# Patient Record
Sex: Female | Born: 1950
Health system: Southern US, Community
[De-identification: ages and names within clinical notes are randomized; demographics above are authoritative.]

## PROBLEM LIST (undated history)

## (undated) DIAGNOSIS — D689 Coagulation defect, unspecified: Secondary | ICD-10-CM

## (undated) DIAGNOSIS — C801 Malignant (primary) neoplasm, unspecified: Secondary | ICD-10-CM

## (undated) DIAGNOSIS — K921 Melena: Secondary | ICD-10-CM

## (undated) DIAGNOSIS — M1990 Unspecified osteoarthritis, unspecified site: Secondary | ICD-10-CM

## (undated) DIAGNOSIS — D696 Thrombocytopenia, unspecified: Secondary | ICD-10-CM

## (undated) HISTORY — DX: Thrombocytopenia, unspecified: D69.6

## (undated) HISTORY — DX: Melena: K92.1

## (undated) HISTORY — PX: COLON SURGERY: SHX602

## (undated) HISTORY — PX: NO PAST SURGERIES: SHX2092

## (undated) HISTORY — DX: Coagulation defect, unspecified: D68.9

## (undated) HISTORY — DX: Unspecified osteoarthritis, unspecified site: M19.90

## (undated) HISTORY — PX: POLYPECTOMY: SHX149

## (undated) HISTORY — PX: COLONOSCOPY: SHX174

---

## 2015-07-29 HISTORY — PX: COLON SURGERY: SHX602

## 2016-03-06 DIAGNOSIS — D485 Neoplasm of uncertain behavior of skin: Secondary | ICD-10-CM | POA: Diagnosis not present

## 2016-03-06 DIAGNOSIS — Z85828 Personal history of other malignant neoplasm of skin: Secondary | ICD-10-CM | POA: Diagnosis not present

## 2016-03-07 DIAGNOSIS — C4441 Basal cell carcinoma of skin of scalp and neck: Secondary | ICD-10-CM | POA: Diagnosis not present

## 2016-03-07 DIAGNOSIS — L821 Other seborrheic keratosis: Secondary | ICD-10-CM | POA: Diagnosis not present

## 2016-03-07 DIAGNOSIS — L309 Dermatitis, unspecified: Secondary | ICD-10-CM | POA: Diagnosis not present

## 2016-04-22 DIAGNOSIS — C4441 Basal cell carcinoma of skin of scalp and neck: Secondary | ICD-10-CM | POA: Diagnosis not present

## 2016-05-09 DIAGNOSIS — L570 Actinic keratosis: Secondary | ICD-10-CM | POA: Diagnosis not present

## 2016-05-09 DIAGNOSIS — Z85828 Personal history of other malignant neoplasm of skin: Secondary | ICD-10-CM | POA: Diagnosis not present

## 2016-05-09 DIAGNOSIS — D225 Melanocytic nevi of trunk: Secondary | ICD-10-CM | POA: Diagnosis not present

## 2016-05-09 DIAGNOSIS — Z23 Encounter for immunization: Secondary | ICD-10-CM | POA: Diagnosis not present

## 2016-05-09 DIAGNOSIS — L821 Other seborrheic keratosis: Secondary | ICD-10-CM | POA: Diagnosis not present

## 2016-07-28 DIAGNOSIS — K921 Melena: Secondary | ICD-10-CM

## 2016-07-28 HISTORY — DX: Melena: K92.1

## 2016-10-10 ENCOUNTER — Other Ambulatory Visit (INDEPENDENT_AMBULATORY_CARE_PROVIDER_SITE_OTHER): Payer: Medicare Other

## 2016-10-10 ENCOUNTER — Ambulatory Visit (INDEPENDENT_AMBULATORY_CARE_PROVIDER_SITE_OTHER): Payer: Medicare Other | Admitting: Internal Medicine

## 2016-10-10 ENCOUNTER — Encounter (INDEPENDENT_AMBULATORY_CARE_PROVIDER_SITE_OTHER): Payer: Self-pay

## 2016-10-10 ENCOUNTER — Encounter: Payer: Self-pay | Admitting: Internal Medicine

## 2016-10-10 VITALS — BP 124/80 | HR 84 | Ht 59.5 in | Wt 124.0 lb

## 2016-10-10 DIAGNOSIS — Z8719 Personal history of other diseases of the digestive system: Secondary | ICD-10-CM

## 2016-10-10 DIAGNOSIS — R194 Change in bowel habit: Secondary | ICD-10-CM

## 2016-10-10 DIAGNOSIS — K625 Hemorrhage of anus and rectum: Secondary | ICD-10-CM

## 2016-10-10 DIAGNOSIS — Z862 Personal history of diseases of the blood and blood-forming organs and certain disorders involving the immune mechanism: Secondary | ICD-10-CM | POA: Diagnosis not present

## 2016-10-10 LAB — CBC WITH DIFFERENTIAL/PLATELET
BASOS ABS: 0.1 10*3/uL (ref 0.0–0.1)
Basophils Relative: 1.2 % (ref 0.0–3.0)
EOS ABS: 0.1 10*3/uL (ref 0.0–0.7)
Eosinophils Relative: 0.9 % (ref 0.0–5.0)
HCT: 44.9 % (ref 36.0–46.0)
HEMOGLOBIN: 14.9 g/dL (ref 12.0–15.0)
Lymphocytes Relative: 20.8 % (ref 12.0–46.0)
Lymphs Abs: 1.4 10*3/uL (ref 0.7–4.0)
MCHC: 33.2 g/dL (ref 30.0–36.0)
MCV: 89.9 fl (ref 78.0–100.0)
MONO ABS: 0.4 10*3/uL (ref 0.1–1.0)
Monocytes Relative: 6.2 % (ref 3.0–12.0)
Neutro Abs: 4.7 10*3/uL (ref 1.4–7.7)
Neutrophils Relative %: 70.9 % (ref 43.0–77.0)
Platelets: 68 10*3/uL — ABNORMAL LOW (ref 150.0–400.0)
RBC: 4.99 Mil/uL (ref 3.87–5.11)
RDW: 12.5 % (ref 11.5–15.5)
WBC: 6.7 10*3/uL (ref 4.0–10.5)

## 2016-10-10 LAB — COMPREHENSIVE METABOLIC PANEL
ALBUMIN: 4.1 g/dL (ref 3.5–5.2)
ALK PHOS: 96 U/L (ref 39–117)
ALT: 11 U/L (ref 0–35)
AST: 19 U/L (ref 0–37)
BUN: 11 mg/dL (ref 6–23)
CHLORIDE: 103 meq/L (ref 96–112)
CO2: 26 mEq/L (ref 19–32)
CREATININE: 0.66 mg/dL (ref 0.40–1.20)
Calcium: 9.4 mg/dL (ref 8.4–10.5)
GFR: 95.11 mL/min (ref >60.00)
GLUCOSE: 93 mg/dL (ref 70–99)
Potassium: 3.6 mEq/L (ref 3.5–5.1)
SODIUM: 138 meq/L (ref 135–145)
TOTAL PROTEIN: 7.2 g/dL (ref 6.0–8.3)
Total Bilirubin: 0.6 mg/dL (ref 0.2–1.2)

## 2016-10-10 MED ORDER — NA SULFATE-K SULFATE-MG SULF 17.5-3.13-1.6 GM/177ML PO SOLN: 1.0000 | Freq: Once | ORAL | 0 refills | Status: AC

## 2016-10-10 NOTE — Patient Instructions (Signed)
Your physician has requested that you go to the basement for the following lab work before leaving today:  CBC, CMP  You have been scheduled for a colonoscopy. Please follow written instructions given to you at your visit today.  Please pick up your prep supplies at the pharmacy within the next 1-3 days. If you use inhalers (even only as needed), please bring them with you on the day of your procedure. Your physician has requested that you go to www.startemmi.com and enter the access code given to you at your visit today. This web site gives a general overview about your procedure. However, you should still follow specific instructions given to you by our office regarding your preparation for the procedure.

## 2016-10-10 NOTE — Progress Notes (Signed)
Patient ID: Allison Dunlap, female   DOB: 07-09-1951, 66 y.o.   MRN: [id number] HPI: Allison Dunlap is a 66 year old female with a remote history of ulcerative colitis (she reports being diagnosed by Dr. Velora Heckler 40 years ago which was treated, she had 1 recurrent 7 years later again treated and no symptoms since), history of idiopathic thrombocytopenia who is seen to discuss change in bowel habits and rectal bleeding. She is here alone today.  She reports 6 months ago she had a significant change in her bowel movements. She noted smaller stools as well as intermittent diarrhea. 3 months ago she developed blood in her stools as well as abdominal bloating and gas. She reports she sometimes passes only blood which is painless in nature. Other times blood is mixed with stool. She has not had abdominal pain despite the change in bowel habits or bleeding. Some foods seem to make symptoms worse such as cheese. She denies weight loss associated with this. Denies upper GI complaint including no heartburn, dysphagia or odynophagia. No nausea or vomiting or change in appetite.  She does not have primary care. She moved back to [place] about 4 years ago from [place], [place] when her husband died.  Regarding her ulcerative colitis she reports she was diagnosed 40 years ago treated and then had what sounds like a flexible sigmoidoscopy showing improvement. Approximately 7 years later she had another flare again treated but she doesn't remember how. Denies family history of IBD or GI tract malignancy.  She does report while living in [place] she was diagnosed with low platelets on a wellness visit. She saw a hematologist and underwent bone marrow biopsy. Reportedly this was "okay" and she recalls the word "idiopathic". She does not feel this was ITP. She recalls the word "myelo" but not necessarily MDS. Follow-up was recommended but never occurred after moving here.  Past Medical History:  Diagnosis Date   . Temporary low platelet count ([place])   . Ulcerative colitis ([place]) 1978    Past Surgical History:  Procedure Laterality Date  . NO PAST SURGERIES      No outpatient prescriptions prior to visit.   No facility-administered medications prior to visit.     No Known Allergies  Family History  Problem Relation Age of Onset  . Alzheimer's disease Mother   . Liver disease Father 76    failure  . Breast cancer Sister   . Heart disease Maternal Grandfather   . Cancer Paternal Grandmother     type unknown    Social History  Substance Use Topics  . Smoking status: Former Smoker    Quit date: 07/28/1997  . Smokeless tobacco: Never Used  . Alcohol use 4.2 oz/week    7 Glasses of wine per week     Comment: wine and beer    ROS: As per history of present illness, otherwise negative  BP 124/80 (BP Location: Left Arm, Patient Position: Sitting, Cuff Size: Normal)   Pulse 84   Ht 4' 11.5" (1.511 m) Comment: height measured without shoes  Wt 124 lb (56.2 kg)   BMI 24.63 kg/m  Constitutional: Well-developed and well-nourished. No distress. HEENT: Normocephalic and atraumatic. Oropharynx is clear and moist. No oropharyngeal exudate. Conjunctivae are normal.  No scleral icterus. Neck: Neck supple. Trachea midline. Cardiovascular: Normal rate, regular rhythm and intact distal pulses. No M/R/G Pulmonary/chest: Effort normal and breath sounds normal. No wheezing, rales or rhonchi. Abdominal: Soft, nontender, nondistended. Bowel sounds active throughout. There are no masses palpable.  No hepatosplenomegaly. Extremities: no clubbing, cyanosis, or edema Lymphadenopathy: No cervical adenopathy noted.  Neurological: Alert and oriented to person place and time. Skin: Skin is warm and dry. No rashes noted. Psychiatric: Normal mood and affect. Behavior is normal.  RELEVANT LABS AND IMAGING: None available, labs ordered  ASSESSMENT/PLAN: 66 year old female with a remote history of  ulcerative colitis (she reports being diagnosed by Dr. Velora Heckler 40 years ago which was treated, she had 1 recurrent 7 years later again treated and no symptoms since), history of idiopathic thrombocytopenia who is seen to discuss change in bowel habits and rectal bleeding.   1. Change in bowel habits/blood in stools/possible hx of UC -- all of her symptoms or colonoscopy. Rule out IBD, rule out malignancy. We discussed the risk, benefits and alternatives to colonoscopy and she wishes to proceed. Check CBC and CMP today.  2. History of thrombocytopenia -- CBC pending today. I offered hematology referral and she wishes to defer for now. If platelets are extremely low I will be more insistent to this referral.  3. Primary care -- lack thereof right now. Again she declines primary care referral. I encouraged her to establish primary care.

## 2016-10-13 ENCOUNTER — Telehealth: Payer: Self-pay | Admitting: Internal Medicine

## 2016-10-13 NOTE — Telephone Encounter (Signed)
See result note.  

## 2016-10-14 ENCOUNTER — Other Ambulatory Visit: Payer: Self-pay

## 2016-10-14 ENCOUNTER — Encounter: Payer: Self-pay | Admitting: Internal Medicine

## 2016-10-14 ENCOUNTER — Telehealth: Payer: Self-pay

## 2016-10-14 ENCOUNTER — Ambulatory Visit (AMBULATORY_SURGERY_CENTER): Payer: Medicare Other | Admitting: Internal Medicine

## 2016-10-14 VITALS — BP 115/67 | HR 57 | Temp 98.0°F | Resp 15 | Ht 59.0 in | Wt 124.0 lb

## 2016-10-14 DIAGNOSIS — C19 Malignant neoplasm of rectosigmoid junction: Secondary | ICD-10-CM

## 2016-10-14 DIAGNOSIS — C189 Malignant neoplasm of colon, unspecified: Secondary | ICD-10-CM

## 2016-10-14 DIAGNOSIS — C801 Malignant (primary) neoplasm, unspecified: Secondary | ICD-10-CM

## 2016-10-14 DIAGNOSIS — R194 Change in bowel habit: Secondary | ICD-10-CM | POA: Diagnosis not present

## 2016-10-14 DIAGNOSIS — K6389 Other specified diseases of intestine: Secondary | ICD-10-CM

## 2016-10-14 HISTORY — DX: Malignant (primary) neoplasm, unspecified: C80.1

## 2016-10-14 MED ORDER — SODIUM CHLORIDE 0.9 % IV SOLN: 500.0000 mL | INTRAVENOUS | Status: DC

## 2016-10-14 NOTE — Progress Notes (Signed)
Called to room to assist during endoscopic procedure.  Patient ID and intended procedure confirmed with present staff. Received instructions for my participation in the procedure from the performing physician.  

## 2016-10-14 NOTE — Patient Instructions (Signed)
YOU HAD AN ENDOSCOPIC PROCEDURE TODAY AT [facility]:   Refer to the procedure report that was given to you for any specific questions about what was found during the examination.  If the procedure report does not answer your questions, please call your gastroenterologist to clarify.  If you requested that your care partner not be given the details of your procedure findings, then the procedure report has been included in a sealed envelope for you to review at your convenience later.  YOU SHOULD EXPECT: Some feelings of bloating in the abdomen. Passage of more gas than usual.  Walking can help get rid of the air that was put into your GI tract during the procedure and reduce the bloating. If you had a lower endoscopy (such as a colonoscopy or flexible sigmoidoscopy) you may notice spotting of blood in your stool or on the toilet paper. If you underwent a bowel prep for your procedure, you may not have a normal bowel movement for a few days.  Please Note:  You might notice some irritation and congestion in your nose or some drainage.  This is from the oxygen used during your procedure.  There is no need for concern and it should clear up in a day or so.  SYMPTOMS TO REPORT IMMEDIATELY:   Following lower endoscopy (colonoscopy or flexible sigmoidoscopy):  Excessive amounts of blood in the stool  Significant tenderness or worsening of abdominal pains  Swelling of the abdomen that is new, acute  Fever of 100F or higher  For urgent or emergent issues, a gastroenterologist can be reached at any hour by calling [phone].   DIET:  We do recommend a small meal at first, but then you may proceed to your regular diet.  Drink plenty of fluids but you should avoid alcoholic beverages for 24 hours.  MEDICATIONS:  Continue present medications. Use over the counter clotrimazole cream twice daily until healed for fungal infection of perianal skin and skin in the intergluteal  cleft.  ACTIVITY:  You should plan to take it easy for the rest of today and you should NOT DRIVE or use heavy machinery until tomorrow (because of the sedation medicines used during the test).    FOLLOW UP: Our staff will call the number listed on your records the next business day following your procedure to check on you and address any questions or concerns that you may have regarding the information given to you following your procedure. If we do not reach you, we will leave a message.  However, if you are feeling well and you are not experiencing any problems, there is no need to return our call.  We will assume that you have returned to your regular daily activities without incident.  If any biopsies were taken you will be contacted by phone or by letter within the next 1-3 weeks.  Please call us at [phone] if you have not heard about the biopsies in 3 weeks.   Perform a CT scan (computed tomography) of chest with contrast, abdomen with contrast, and pelvis with contrast. Appointment scheduled by Rosanne Sack for tomorrow 10/15/16. Patient given 2 bottles of contrast with instructions for use prior to CT scan.  Oncologic and surgical referrals will be done by Dr. Vena Rua office for further care pending CT results. Thank you for allowing [place] to provide for your healthcare needs today.  SIGNATURES/CONFIDENTIALITY: You and/or your care partner have signed paperwork which will be entered into your electronic medical  record.  These signatures attest to the fact that that the information above on your After Visit Summary has been reviewed and is understood.  Full responsibility of the confidentiality of this discharge information lies with you and/or your care-partner.

## 2016-10-14 NOTE — Op Note (Signed)
[facility] Patient Name: Allison Dunlap Procedure Date: 10/14/2016 1:30 PM MRN: [id number] Endoscopist: Jerene Bears , MD Age: 66 Referring MD:  Date of Birth: 1951/02/08 Gender: Female Account #: [id number] Procedure:                Colonoscopy Indications:              Rectal bleeding, Change in bowel habits, reported                            remote history of ulcerative colitis Medicines:                Monitored Anesthesia Care Procedure:                Pre-Anesthesia Assessment:                           - Prior to the procedure, a History and Physical                            was performed, and patient medications and                            allergies were reviewed. The patient's tolerance of                            previous anesthesia was also reviewed. The risks                            and benefits of the procedure and the sedation                            options and risks were discussed with the patient.                            All questions were answered, and informed consent                            was obtained. Prior Anticoagulants: The patient has                            taken no previous anticoagulant or antiplatelet                            agents. ASA Grade Assessment: II - A patient with                            mild systemic disease. After reviewing the risks                            and benefits, the patient was deemed in                            satisfactory condition to undergo the procedure.  After obtaining informed consent, the colonoscope                            was passed under direct vision. Throughout the                            procedure, the patient's blood pressure, pulse, and                            oxygen saturations were monitored continuously. The                            Model PCF-H190DL [id number]) scope was introduced                            through the anus and  advanced to the the terminal                            ileum. The colonoscopy was performed without                            difficulty. The patient tolerated the procedure                            well. The quality of the bowel preparation was                            good. The terminal ileum, ileocecal valve,                            appendiceal orifice, and rectum were photographed. Scope In: 1:45:51 PM Scope Out: 2:04:34 PM Scope Withdrawal Time: 0 hours 14 minutes 19 seconds  Total Procedure Duration: 0 hours 18 minutes 43 seconds  Findings:                 The perianal exam findings include a perianal                            fungal rash.                           The digital rectal exam was normal.                           The terminal ileum appeared normal.                           A fungating partially obstructing large mass was                            found in the recto-sigmoid colon. The mass was                            circumferential. The mass begins about 10 cm from  the dentate line and measured five cm in length.                            Oozing was present. Multiple biopsies were obtained                            with cold forceps for histology in a targeted                            manner. Area proximal to the tumor was tattooed on                            opposite walls with an injection of 3 mL total of                            Spot (carbon black).                           Multiple small and large-mouthed diverticula were                            found in the sigmoid colon and descending colon.                           The retroflexed view of the distal rectum and anal                            verge was normal and showed no anal or rectal                            abnormalities. Complications:            No immediate complications. Estimated Blood Loss:     Estimated blood loss was minimal. Impression:                - Perianal fungal rash found on perianal exam.                           - The examined portion of the ileum was normal.                           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 Biopsied. Tattooed.                           - Moderate diverticulosis in the sigmoid colon and                            in the descending colon.                           - The distal rectum and anal verge are normal on  retroflexion view. Recommendation:           - Patient has a contact number available for                            emergencies. The signs and symptoms of potential                            delayed complications were discussed with the                            patient. Return to normal activities tomorrow.                            Written discharge instructions were provided to the                            patient.                           - Resume previous diet.                           - Continue present medications.                           - Use OTC clotrimazole cream twice daily until                            healed for fungal infection of perianal skin and                            skin in the intergluteal cleft                           - Await pathology results.                           - Repeat colonoscopy is recommended for                            surveillance based on today's findings. The                            colonoscopy date will be determined after pathology                            results from today's exam become available for                            review.                           - Perform a CT scan (computed tomography) of chest                            with contrast, abdomen  with contrast and pelvis                            with contrast at appointment to be scheduled.                           - Oncologic and surgical referrals. Jerene Bears, MD 10/14/2016 2:15:46 PM This report has been signed electronically.

## 2016-10-14 NOTE — Telephone Encounter (Signed)
Pt scheduled for CT of CAP at [facility] 10/15/16@2 :30pm, pt to arrive there at 2:15pm. Pt to be NPO after 10:30am, drink bottle 1 of contrast at 12:30pm, bottle 2 of contrast at 1:30pm. [facility] RN to notify pt of appt and instructions.

## 2016-10-14 NOTE — Progress Notes (Signed)
Spontaneous respirations throughout. VSS. Resting comfortably. To PACU on room air. Report to  [employer].

## 2016-10-14 NOTE — Progress Notes (Signed)
Pt's states no medical or surgical changes since previsit or office visit. 

## 2016-10-15 ENCOUNTER — Telehealth: Payer: Self-pay | Admitting: *Deleted

## 2016-10-15 ENCOUNTER — Ambulatory Visit (INDEPENDENT_AMBULATORY_CARE_PROVIDER_SITE_OTHER)
Admission: RE | Admit: 2016-10-15 | Discharge: 2016-10-15 | Disposition: A | Discharge status: Home / Self Care | Payer: Medicare Other | Source: Ambulatory Visit | Attending: Internal Medicine | Admitting: Internal Medicine

## 2016-10-15 ENCOUNTER — Other Ambulatory Visit: Payer: Self-pay

## 2016-10-15 ENCOUNTER — Telehealth: Payer: Self-pay | Admitting: Internal Medicine

## 2016-10-15 ENCOUNTER — Telehealth: Payer: Self-pay

## 2016-10-15 DIAGNOSIS — C189 Malignant neoplasm of colon, unspecified: Secondary | ICD-10-CM | POA: Diagnosis not present

## 2016-10-15 DIAGNOSIS — K6389 Other specified diseases of intestine: Secondary | ICD-10-CM

## 2016-10-15 DIAGNOSIS — K5730 Diverticulosis of large intestine without perforation or abscess without bleeding: Secondary | ICD-10-CM | POA: Diagnosis not present

## 2016-10-15 MED ORDER — IOPAMIDOL (ISOVUE-300) INJECTION 61%
100.0000 mL | Freq: Once | INTRAVENOUS | Status: AC | PRN
  Administered 2016-10-15: 100 mL via INTRAVENOUS

## 2016-10-15 NOTE — Telephone Encounter (Signed)
Left a message at [phone] for the pt that we call to check on him.  This is the second call.  Left a message for him to call us back if any questions or concerns. maw

## 2016-10-15 NOTE — Telephone Encounter (Signed)
See result note.  

## 2016-10-15 NOTE — Telephone Encounter (Signed)
Pt is calling back said she is doing fine

## 2016-10-15 NOTE — Telephone Encounter (Signed)
Patient calling for CT results. Please advise.

## 2016-10-15 NOTE — Telephone Encounter (Signed)
No answer, message left for the patient. 

## 2016-10-16 ENCOUNTER — Telehealth: Payer: Self-pay

## 2016-10-16 NOTE — Telephone Encounter (Signed)
Pt has appt with [facility] Dr. Marcello Moores 11/11/16@9am , pt to arrive there at 8:30am.

## 2016-10-17 ENCOUNTER — Telehealth: Payer: Self-pay | Admitting: Hematology

## 2016-10-17 ENCOUNTER — Telehealth: Payer: Self-pay | Admitting: Internal Medicine

## 2016-10-17 ENCOUNTER — Encounter: Payer: Self-pay | Admitting: Hematology

## 2016-10-17 NOTE — Telephone Encounter (Signed)
Pt is currently scheduled for 11/11/16. Dr. Hilarie Fredrickson have you heard anything about a sooner appt? Please advise.

## 2016-10-17 NOTE — Telephone Encounter (Signed)
Tc to schedule the pt an appt to see Dr. Burr Medico.No ans. The phone continued to ring and no one picked up.

## 2016-10-20 NOTE — Telephone Encounter (Signed)
I did receive staff message from Dr. Manon Hilding office No earlier appointment available, but she is on the wait list and may well get a call to come in sooner

## 2016-10-20 NOTE — Telephone Encounter (Signed)
Left message for pt letting her know there were no sooner appts available but that she may get a call if there is a cancellation.

## 2016-10-21 ENCOUNTER — Telehealth: Payer: Self-pay | Admitting: *Deleted

## 2016-10-21 ENCOUNTER — Encounter: Payer: Self-pay | Admitting: Hematology

## 2016-10-21 ENCOUNTER — Ambulatory Visit (HOSPITAL_BASED_OUTPATIENT_CLINIC_OR_DEPARTMENT_OTHER): Payer: Medicare Other | Admitting: Hematology

## 2016-10-21 ENCOUNTER — Telehealth: Payer: Self-pay | Admitting: Hematology

## 2016-10-21 DIAGNOSIS — C20 Malignant neoplasm of rectum: Secondary | ICD-10-CM | POA: Insufficient documentation

## 2016-10-21 DIAGNOSIS — K5190 Ulcerative colitis, unspecified, without complications: Secondary | ICD-10-CM | POA: Diagnosis not present

## 2016-10-21 DIAGNOSIS — Z87891 Personal history of nicotine dependence: Secondary | ICD-10-CM | POA: Diagnosis not present

## 2016-10-21 DIAGNOSIS — Z809 Family history of malignant neoplasm, unspecified: Secondary | ICD-10-CM

## 2016-10-21 DIAGNOSIS — Z803 Family history of malignant neoplasm of breast: Secondary | ICD-10-CM | POA: Diagnosis not present

## 2016-10-21 DIAGNOSIS — D693 Immune thrombocytopenic purpura: Secondary | ICD-10-CM

## 2016-10-21 DIAGNOSIS — R599 Enlarged lymph nodes, unspecified: Secondary | ICD-10-CM | POA: Diagnosis not present

## 2016-10-21 NOTE — Progress Notes (Signed)
[facility]  [phone] [phone]  Clinic New Consult Note   Patient Care Team: No Pcp Per Patient as PCP - General (General Practice) 10/21/2016  REFERRAL PHYSICIAN: Dr. Hilarie Fredrickson  CHIEF COMPLAINTS/PURPOSE OF CONSULTATION:  Newly diagnosed rectosigmoid cancer     Rectal cancer ([place])   10/14/2016 Initial Diagnosis    Rectal cancer ([place])      10/14/2016 Procedure    Colonoscopy by Dr. Hilarie Fredrickson showed a fungating partially upchucking large mass in the rectosigmoid colon, the mass begins about 10 cm from the dentate line and measured 5 cm in length. Biopsy was obtained. Multiple small and largemouth diverticula were found in the sigmoid colon and the ascending colon.      10/14/2016 Initial Biopsy    Rectosigmoid mass biopsy showed adenocarcinoma       10/15/2016 Imaging    CT of chest, abdomen and pelvis with contrast showed a 4 cm annular colonic soft tissue mass near the rectosigmoid junction, consistent with known primary carcinoma. Several small her chronic lymph nodes measuring up to 57m, suspicious for local lymph node metastasis. No evidence of distant metastasis.       HISTORY OF PRESENTING ILLNESS:  Allison Droege66y.o. female is here because of Her recently diagnosed rectosigmoid colon cancer. She was referred by her gastroenterologist Dr. PHilarie Fredrickson She presents to my clinic by herself today.  She has been having constipationa and diarrhea for 6 months, and daily hematochezia, a tea spoon each time, no rectal pain, no bloating, nause or other symptoms, no change of appetie and weight. She does not have a primary care physician, and [facility] She was seen by Dr. PHilarie Fredrickson She underwent colonoscopy, which showed a large mass in the rectosigmoid junction. Biopsy showed adenocarcinoma.  She was diagnosed with ulcerative colitis in her 20's, resolved and now.  She has had thrombocytopenia since 2013, she has  bone marrow biopsy, which was normal per pt, never required any treatment    She is widowed, no children, [place] in [place] she is not working   MEDICAL HISTORY:  Past Medical History:  Diagnosis Date  . Clotting disorder ([place]   . Temporary low platelet count ([place]     SURGICAL HISTORY: Past Surgical History:  Procedure Laterality Date  . NO PAST SURGERIES      SOCIAL HISTORY: Social History   Social History  . Marital status: Widowed    Spouse name: N/A  . Number of children: 0  . Years of education: N/A   Occupational History  . retired    Social History Main Topics  . Smoking status: Former Smoker    Packs/day: 0.50    Years: 30.00    Quit date: 07/28/1997  . Smokeless tobacco: Never Used  . Alcohol use 4.2 oz/week    7 Glasses of wine per week     Comment: wine and beer  . Drug use: No  . Sexual activity: Not on file   Other Topics Concern  . Not on file   Social History Narrative  . No narrative on file    FAMILY HISTORY: Family History  Problem Relation Age of Onset  . Alzheimer's disease Mother   . Liver disease Father 615   failure  . Breast cancer Sister   . Cancer Sister 520   breast cancer   . Heart disease Maternal Grandfather   . Cancer Paternal Grandmother     type unknown  ALLERGIES:  has No Known Allergies.  MEDICATIONS:  Current Outpatient Prescriptions  Medication Sig Dispense Refill  . Cholecalciferol (VITAMIN D PO) Take 1 tablet by mouth daily.    . Cyanocobalamin (VITAMIN B-12 PO) Take 1 tablet by mouth daily.    . Multiple Vitamins-Minerals (OCUVITE PO) Take 1 tablet by mouth daily.     Current Facility-Administered Medications  Medication Dose Route Frequency Provider Last Rate Last Dose  . 0.9 %  sodium chloride infusion  500 mL Intravenous Continuous Jerene Bears, MD        REVIEW OF SYSTEMS:   Constitutional: Denies fevers, chills or abnormal night sweats Eyes: Denies blurriness of vision, double  vision or watery eyes Ears, nose, mouth, throat, and face: Denies mucositis or sore throat Respiratory: Denies cough, dyspnea or wheezes Cardiovascular: Denies palpitation, chest discomfort or lower extremity swelling Gastrointestinal:  Denies nausea, heartburn, (+) constipation and diarrhea, (+) hematochezia Skin: Denies abnormal skin rashes Lymphatics: Denies new lymphadenopathy or easy bruising Neurological:Denies numbness, tingling or new weaknesses Behavioral/Psych: Mood is stable, no new changes  All other systems were reviewed with the patient and are negative.  PHYSICAL EXAMINATION: ECOG PERFORMANCE STATUS: 0 - Asymptomatic  Vitals:   10/21/16 1535  BP: 138/77  Pulse: 74  Resp: 18  Temp: 97.8 F (36.6 C)   Filed Weights   10/21/16 1535  Weight: 124 lb (56.2 kg)    GENERAL:alert, no distress and comfortable SKIN: skin color, texture, turgor are normal, no rashes or significant lesions EYES: normal, conjunctiva are pink and non-injected, sclera clear OROPHARYNX:no exudate, no erythema and lips, buccal mucosa, and tongue normal  NECK: supple, thyroid normal size, non-tender, without nodularity LYMPH:  no palpable lymphadenopathy in the cervical, axillary or inguinal LUNGS: clear to auscultation and percussion with normal breathing effort HEART: regular rate & rhythm and no murmurs and no lower extremity edema ABDOMEN:abdomen soft, non-tender and normal bowel sounds Musculoskeletal:no cyanosis of digits and no clubbing  PSYCH: alert & oriented x 3 with fluent speech NEURO: no focal motor/sensory deficits  LABORATORY DATA:  I have reviewed the data as listed CBC Latest Ref Rng & Units 10/10/2016  WBC 4.0 - 10.5 K/uL 6.7  Hemoglobin 12.0 - 15.0 g/dL 14.9  Hematocrit 36.0 - 46.0 % 44.9  Platelets 150.0 - 400.0 K/uL 68.0 Repeated and verified X2.(L)   CMP Latest Ref Rng & Units 10/10/2016  Glucose 70 - 99 mg/dL 93  BUN 6 - 23 mg/dL 11  Creatinine 0.40 - 1.20 mg/dL  0.66  Sodium 135 - 145 mEq/L 138  Potassium 3.5 - 5.1 mEq/L 3.6  Chloride 96 - 112 mEq/L 103  CO2 19 - 32 mEq/L 26  Calcium 8.4 - 10.5 mg/dL 9.4  Total Protein 6.0 - 8.3 g/dL 7.2  Total Bilirubin 0.2 - 1.2 mg/dL 0.6  Alkaline Phos 39 - 117 U/L 96  AST 0 - 37 U/L 19  ALT 0 - 35 U/L 11   PATHOLOGY REPORT  Diagnosis 10/14/2016 Colon, biopsy, rectosigmoid tumor - ADENOCARCINOMA.  RADIOGRAPHIC STUDIES: I have personally reviewed the radiological images as listed and agreed with the findings in the report. Ct Chest W Contrast  Result Date: 10/15/2016 CLINICAL DATA:  Newly diagnosed colon carcinoma by colonoscopy. Ulcerative colitis. EXAM: CT CHEST, ABDOMEN, AND PELVIS WITH CONTRAST TECHNIQUE: Multidetector CT imaging of the chest, abdomen and pelvis was performed following the standard protocol during bolus administration of intravenous contrast. CONTRAST:  187m ISOVUE-300 IOPAMIDOL (ISOVUE-300) INJECTION 61% COMPARISON:  None. FINDINGS:  CT CHEST FINDINGS Cardiovascular: No acute findings. Mediastinum/Lymph Nodes: No pathologically enlarged lymph nodes identified. 10 mm low-attenuation left thyroid lobe nodule incidentally noted. Lungs/Pleura: No pulmonary infiltrate or mass identified. No effusion present. Musculoskeletal:  No suspicious bone lesions identified. CT ABDOMEN AND PELVIS FINDINGS Hepatobiliary: Several small fluid attenuation cysts are seen in the right and left hepatic lobes. No liver masses identified. Gallbladder is unremarkable. Pancreas:  No mass or inflammatory changes. Spleen:  Within normal limits in size and appearance. Adrenals/Urinary tract:  No masses or hydronephrosis. Stomach/Bowel: Annular colonic soft tissue mass is seen near the rectosigmoid junction which measures 3.5 x 3.6 cm by 4.3 cm. There is no evidence of bowel obstruction. Diverticulosis is seen involving the proximal sigmoid colon, however there is no evidence of diverticulitis. Vascular/Lymphatic: Right  pericolonic lymph node is seen measuring 10 mm on image 95/2, suspicious for lymph node metastasis. Tiny 5-6 mm posterior pericolonic lymph nodes are seen in the presacral space, also suspicious for early lymph node metastases. No pathologically enlarged lymph nodes identified within the abdomen. No abdominal aortic aneurysm. Reproductive:  No mass or other significant abnormality identified. Other:  None. Musculoskeletal: No suspicious bone lesions identified. Degenerative lumbar spondylosis incidentally noted. IMPRESSION: 4 cm annular colonic soft tissue mass near the rectosigmoid junction, consistent with known primary carcinoma. Several small pericolonic lymph nodes measuring up to 10 mm, suspicious for local lymph node metastases. No evidence of distant metastatic disease. Proximal sigmoid diverticulosis.  No evidence of diverticulitis. Electronically Signed   By: Earle Gell M.D.   On: 10/15/2016 15:57   Ct Abdomen Pelvis W Contrast  Result Date: 10/15/2016 CLINICAL DATA:  Newly diagnosed colon carcinoma by colonoscopy. Ulcerative colitis. EXAM: CT CHEST, ABDOMEN, AND PELVIS WITH CONTRAST TECHNIQUE: Multidetector CT imaging of the chest, abdomen and pelvis was performed following the standard protocol during bolus administration of intravenous contrast. CONTRAST:  166m ISOVUE-300 IOPAMIDOL (ISOVUE-300) INJECTION 61% COMPARISON:  None. FINDINGS: CT CHEST FINDINGS Cardiovascular: No acute findings. Mediastinum/Lymph Nodes: No pathologically enlarged lymph nodes identified. 10 mm low-attenuation left thyroid lobe nodule incidentally noted. Lungs/Pleura: No pulmonary infiltrate or mass identified. No effusion present. Musculoskeletal:  No suspicious bone lesions identified. CT ABDOMEN AND PELVIS FINDINGS Hepatobiliary: Several small fluid attenuation cysts are seen in the right and left hepatic lobes. No liver masses identified. Gallbladder is unremarkable. Pancreas:  No mass or inflammatory changes. Spleen:   Within normal limits in size and appearance. Adrenals/Urinary tract:  No masses or hydronephrosis. Stomach/Bowel: Annular colonic soft tissue mass is seen near the rectosigmoid junction which measures 3.5 x 3.6 cm by 4.3 cm. There is no evidence of bowel obstruction. Diverticulosis is seen involving the proximal sigmoid colon, however there is no evidence of diverticulitis. Vascular/Lymphatic: Right pericolonic lymph node is seen measuring 10 mm on image 95/2, suspicious for lymph node metastasis. Tiny 5-6 mm posterior pericolonic lymph nodes are seen in the presacral space, also suspicious for early lymph node metastases. No pathologically enlarged lymph nodes identified within the abdomen. No abdominal aortic aneurysm. Reproductive:  No mass or other significant abnormality identified. Other:  None. Musculoskeletal: No suspicious bone lesions identified. Degenerative lumbar spondylosis incidentally noted. IMPRESSION: 4 cm annular colonic soft tissue mass near the rectosigmoid junction, consistent with known primary carcinoma. Several small pericolonic lymph nodes measuring up to 10 mm, suspicious for local lymph node metastases. No evidence of distant metastatic disease. Proximal sigmoid diverticulosis.  No evidence of diverticulitis. Electronically Signed   By: JSharrie RothmanD.  On: 10/15/2016 15:57   Colonoscopy 10/14/2016 - The perianal exam findings include a perianal fungal rash. - The digital rectal exam was normal. - The terminal ileum appeared normal. - A fungating partially obstructing large mass was found in the recto-sigmoid colon. The mass was circumferential. The mass begins about 10 cm from the dentate line and measured five cm in length. Oozing was present. Multiple biopsies were obtained with cold forceps for histology in a targeted manner. Area proximal to the tumor was tattooed on opposite walls with an injection of 3 mL total of Spot (carbon black). - Multiple small and large-mouthed  diverticula were found in the sigmoid colon and descending colon. - The retroflexed view of the distal rectum and anal verge was normal and showed no anal or rectal abnormalities.  ASSESSMENT & PLAN: 66 year old Caucasian female, with past medical history of ulcerative colitis, some cytopenia, presented with diarrhea, constipation, and persistent hematochezia. Months.  1. Rectosigmoid adenocarcinoma, cT3N1M0, stage IIIB, MSI-pending  -I reviewed his CT scan findings, colonoscopy and biopsy pathology results in great details with patient. -The tumor is located in the proximal rectum and rectosigmoid junction. -CT scan showed several perirectal lymph nodes, suspicious for node metastasis. She likely has locally advanced disease. -I presented her case in our GI tumor Board. Due to the location of the cancer, Dr. Marcello Moores recommends neoadjuvant chemotherapy and radiation, followed by surgical resection. -Tumor Board feels her disease is likely T3 N1 based on CT scan findings, EUS or MRI for staging is probably not needed. -She will be referred to see radiation oncologist Dr. Lisbeth Renshaw -I recommend neoadjuvant concurrent chemotherapy with Xeloda or 5-FU. Patient opted Xeloda. --Chemotherapy consent: Side effects including but does not not limited to, fatigue, nausea, vomiting, diarrhea, hair loss, neuropathy, fluid retention, renal and kidney dysfunction, neutropenic fever, needed for blood transfusion, bleeding, coronary artery spasm and heart attack, were discussed with patient in great detail. He agrees to proceed. -we plan to start chemo and radiation in about 2 weeks - chemo class  2. History of ITP  -She was found to have some cytopenia many years ago, per patient she underwent a bone marrow biopsy which was unremarkable. This is opposed consistent with ITP.  -She has not required any treatment. Her platelet counts is 68000 on 10/10/16 -We discussed chemotherapy may cause thrombocytopenia. If needed,  I would consider steroids treatment for her ITP during chemo.   3. History of ulcerative colitis -follow up with Dr. Hilarie Fredrickson   Plan -Case was discussed in GI tumor Board -She will be referred to radiation oncologist Dr. Lisbeth Renshaw -I will send a prescription of Xeloda to our oral pharmacy -plan to start neoadjuvant chemoRT the week of 4/16.      No orders of the defined types were placed in this encounter.   All questions were answered. The patient knows to call the clinic with any problems, questions or concerns. I spent 55 minutes counseling the patient face to face. The total time spent in the appointment was 60 minutes and more than 50% was on counseling.     Truitt Merle, MD 10/21/2016

## 2016-10-21 NOTE — Telephone Encounter (Signed)
Appt has been scheduled for the pt to see Dr. Burr Medico today, 3/27 at 330pm. Pt aware to arrive 15 minutes early. Voiced understanding.

## 2016-10-21 NOTE — Telephone Encounter (Signed)
Left message with Dr Jodelle Gross nurse at [facility] that Dr Burr Medico needs recent office note & bone marrow biopsy report.  Our office ph # & fax # left on message.  Release of information signed  & can fax if they need it.

## 2016-10-22 ENCOUNTER — Encounter: Payer: Self-pay | Admitting: Radiation Oncology

## 2016-10-23 MED ORDER — CAPECITABINE 500 MG PO TABS: ORAL_TABLET | ORAL | 0 refills | Status: DC

## 2016-10-24 ENCOUNTER — Encounter: Payer: Self-pay | Admitting: Hematology

## 2016-10-27 ENCOUNTER — Telehealth: Payer: Self-pay | Admitting: Pharmacist

## 2016-10-27 ENCOUNTER — Encounter: Payer: Self-pay | Admitting: Radiation Oncology

## 2016-10-27 DIAGNOSIS — C20 Malignant neoplasm of rectum: Secondary | ICD-10-CM

## 2016-10-27 MED ORDER — CAPECITABINE 500 MG PO TABS: ORAL_TABLET | ORAL | 0 refills | Status: DC

## 2016-10-27 MED FILL — XELODA 500 MG TABLET: 500 | 28 days supply | Qty: 100 | Fill #0

## 2016-10-27 NOTE — Telephone Encounter (Signed)
Oral Chemotherapy Pharmacist Encounter  Received new prescription for Xeloda for rectal cancer to be used in conjunction with XRT.  Labs from 10/10/16 reviewed, OK for treatment. Noted chronic thrombocytopenia. This will be monitored.  Current medication list in Epic assessed, no DDIs with Xeloda identified  Prescription will be e-scribed to the [facility] for benefits analysis. Oral Oncology Clinic will continue to follow.  Allison Dunlap, PharmD, BCPS, BCOP 10/27/2016  3:44 PM Oral Oncology Clinic [phone]

## 2016-10-27 NOTE — Progress Notes (Addendum)
GI Location of Tumor / Histology: Rectosigmoid colon cancer  Allison Dunlap presented  months ago with symptoms of: daily hemaatochezia,diarrhea and constipation 6 months  Biopsies of  (if applicable) revealed: Diagnosis 10/14/2016:  Colon, biopsy, rectosigmoid tumor- ADENOCARCINOMA.  Past/Anticipated interventions by surgeon, if any: 10/14/16 Colonoscopy with bx Dr. Hilarie Fredrickson, Md  Past/Anticipated interventions by medical oncology, if any: Dr. Elpidio Galea 10/21/16, Xeloda rx , plan to start neoadjuvant chemo RT the week 11/10/16  Weight changes, if any: NO  Bowel/Bladder complaints, if GBK:ORJGY stools 3-4x day, blood in tissue occasional gas abdomen, no bladder issues  Nausea / Vomiting, if any:  No  Pain issues, if any: no  Any blood per rectum:   yes SAFETY ISSUES:no  Prior radiation? NO  Pacemaker/ICD?  NO   Is the patient on methotrexate? NO  Current Complaints/Details:Widowed, no children, Hx Ulcerative colitis,, HX ITP< thrombocytopenia,  former smoker 1/2 ppd 30 years quit 07/28/97, 7 glasses wine per week, no drug use or smokeless tobacco Mother alzheimer's  diseae,Father Liver disease,failure,sister breast cancer,sister breast cancer, Maternal grandfather heart disease, Paternal grandmother cancer,unknown  BP 140/83 (BP Location: Left Arm, Patient Position: Sitting, Cuff Size: Normal)   Pulse 75   Temp 98.4 F (36.9 C) (Oral)   Resp 20   Ht 4' 11"  (1.499 m)   Wt 123 lb 12.8 oz (56.2 kg)   BMI 25.00 kg/m   Wt Readings from Last 3 Encounters:  11/03/16 123 lb 12.8 oz (56.2 kg)  10/21/16 124 lb (56.2 kg)  10/14/16 124 lb (56.2 kg)

## 2016-10-28 ENCOUNTER — Telehealth: Payer: Self-pay | Admitting: *Deleted

## 2016-10-28 NOTE — Telephone Encounter (Signed)
Spoke with Chrissy, RN @ [facility] to request office notes and bone marrow biopsy report from Dr. Aletta Edouard.  Per Chrissy, Dr. Aletta Edouard is on extended leave at present.  Pt had not seen any providers  at [facility] at all.   Per Chrissy, pt was not even a pt there; there were no notes, no testing studies done. Angie    Phone      [phone].

## 2016-11-03 ENCOUNTER — Encounter: Payer: Self-pay | Admitting: Radiation Oncology

## 2016-11-03 ENCOUNTER — Ambulatory Visit
Admission: RE | Admit: 2016-11-03 | Discharge: 2016-11-03 | Disposition: A | Discharge status: Home / Self Care | Payer: Medicare Other | Source: Ambulatory Visit | Attending: Radiation Oncology | Admitting: Radiation Oncology

## 2016-11-03 VITALS — BP 140/83 | HR 75 | Temp 98.4°F | Resp 20 | Ht 59.0 in | Wt 123.8 lb

## 2016-11-03 DIAGNOSIS — Z510 Encounter for antineoplastic radiation therapy: Secondary | ICD-10-CM | POA: Insufficient documentation

## 2016-11-03 DIAGNOSIS — Z853 Personal history of malignant neoplasm of breast: Secondary | ICD-10-CM | POA: Insufficient documentation

## 2016-11-03 DIAGNOSIS — C19 Malignant neoplasm of rectosigmoid junction: Secondary | ICD-10-CM | POA: Diagnosis not present

## 2016-11-03 DIAGNOSIS — C20 Malignant neoplasm of rectum: Secondary | ICD-10-CM

## 2016-11-03 DIAGNOSIS — Z8669 Personal history of other diseases of the nervous system and sense organs: Secondary | ICD-10-CM | POA: Diagnosis not present

## 2016-11-03 HISTORY — DX: Malignant (primary) neoplasm, unspecified: C80.1

## 2016-11-03 NOTE — Progress Notes (Signed)
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   Staging: [id number]   HISTORY OF PRESENT ILLNESS::Allison Dunlap is a 66 y.o. female who is seen for an initial consultation visit.  She presented to Dr. Hilarie Fredrickson at [facility] gastroenterology on 10/14/16 after a 6 month history of constipation, diarrhea, and daily hematochezia. Colonoscopy revealed a 5 cm mass in the rectosigmoid junction. 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 Patient underwent CT scan of the chest, abdomen, and pelvis on 10/15/16. This showed a 4 cm annular colonic soft tissue mass near the rectosigmoid junction as well as several small chronic lymph nodes measuring up to 10 mm suspicious for local metastasis. There was no evidence of distant metastasis.  Patient was seen by Dr. Burr Medico on 10/24/16. Per her report, the patient's case was presented at GI tumor Board. The consensus was to proceed with neoadjuvant chemotherapy (Xeloda) and radiation, followed by surgical resection. Patient is scheduled to start chemotherapy the week of 11/10/16.  Patient reports loose stools 3-4 times a day, blood on tissue, and occasional gas. She denies weight changes, nausea/vomiting, pain or bladder complaints.  PREVIOUS RADIATION THERAPY: No   PAST MEDICAL HISTORY:  has a past medical history of Cancer ([place]) (10/14/2016); Clotting disorder ([place]); and Temporary low platelet count ([place]).     PAST SURGICAL HISTORY: Past Surgical History:  Procedure Laterality Date  . NO PAST SURGERIES       FAMILY HISTORY: family history includes Alzheimer's disease in her mother; Breast cancer in her sister; Cancer in her paternal grandmother; Cancer (age of onset: 65) in her sister; Heart disease in her maternal  grandfather; Liver disease (age of onset: 57) in her father.   SOCIAL HISTORY:  reports that she quit smoking about 19 years ago. She has a 15.00 pack-year smoking history. She has never used smokeless tobacco. She reports that she drinks about 4.2 oz of alcohol per week . She reports that she does not use drugs.   ALLERGIES: Patient has no known allergies.   MEDICATIONS:  Current Outpatient Prescriptions  Medication Sig Dispense Refill  . Cholecalciferol (VITAMIN D PO) Take 1 tablet by mouth daily.    . Cyanocobalamin (VITAMIN B-12 PO) Take 1 tablet by mouth daily.    . Multiple Vitamins-Minerals (OCUVITE PO) Take 1 tablet by mouth daily.    . capecitabine (XELODA) 500 MG tablet Take 3 tab in the morning and 2 tab in the evening, on the days of radiation, Monday through Friday (Patient not taking: Reported on 11/03/2016) 150 tablet 0   Current Facility-Administered Medications  Medication Dose Route Frequency Provider Last Rate Last Dose  . 0.9 %  sodium chloride infusion  500 mL Intravenous Continuous Jerene Bears, MD         REVIEW OF SYSTEMS:  A complete review of systems was reviewed with the patient and pertinent positives findings are noted in the history of present illness.  PHYSICAL EXAM:  height is 4' 11"  (1.499 m) and weight is 123 lb 12.8 oz (56.2 kg). Her oral temperature is 98.4 F (36.9 C). Her blood pressure is 140/83 and her pulse is 75. Her respiration is 20.   ECOG = 0  0 - Asymptomatic (Fully active, able to carry  on all predisease activities without restriction)  1 - Symptomatic but completely ambulatory (Restricted in physically strenuous activity but ambulatory and able to carry out work of a light or sedentary nature. For example, light housework, office work)  2 - Symptomatic, <50% in bed during the day (Ambulatory and capable of all self care but unable to carry out any work activities. Up and about more than 50% of waking hours)  3 - Symptomatic, >50% in  bed, but not bedbound (Capable of only limited self-care, confined to bed or chair 50% or more of waking hours)  4 - Bedbound (Completely disabled. Cannot carry on any self-care. Totally confined to bed or chair)  5 - Death   Eustace Pen MM, Creech RH, Tormey DC, et al. [phone]). "Toxicity and response criteria of the [facility] Group". Bernice Oncol. 5 (6): 649-55    General: Well-developed, in no acute distress HEENT: Normocephalic, atraumatic; oral cavity clear Neck: Supple without any lymphadenopathy Cardiovascular: Regular rate and rhythm Respiratory: Clear to auscultation bilaterally GI: Soft, nontender, normal bowel sounds Extremities: No edema present Neuro: No focal deficits  On rectal exam there were no external lesions, and a barely palpable tumor within the mid to upper rectum. No blood on exam glove.   LABORATORY DATA:  Lab Results  Component Value Date   WBC 6.7 10/10/2016   HGB 14.9 10/10/2016   HCT 44.9 10/10/2016   MCV 89.9 10/10/2016   PLT 68.0 Repeated and verified X2. (L) 10/10/2016   Lab Results  Component Value Date   NA 138 10/10/2016   K 3.6 10/10/2016   CL 103 10/10/2016   CO2 26 10/10/2016   Lab Results  Component Value Date   ALT 11 10/10/2016   AST 19 10/10/2016   ALKPHOS 96 10/10/2016   BILITOT 0.6 10/10/2016      RADIOGRAPHY: Ct Chest W Contrast  Result Date: 10/15/2016 CLINICAL DATA:  Newly diagnosed colon carcinoma by colonoscopy. Ulcerative colitis. EXAM: CT CHEST, ABDOMEN, AND PELVIS WITH CONTRAST TECHNIQUE: Multidetector CT imaging of the chest, abdomen and pelvis was performed following the standard protocol during bolus administration of intravenous contrast. CONTRAST:  148m ISOVUE-300 IOPAMIDOL (ISOVUE-300) INJECTION 61% COMPARISON:  None. FINDINGS: CT CHEST FINDINGS Cardiovascular: No acute findings. Mediastinum/Lymph Nodes: No pathologically enlarged lymph nodes identified. 10 mm low-attenuation left thyroid lobe  nodule incidentally noted. Lungs/Pleura: No pulmonary infiltrate or mass identified. No effusion present. Musculoskeletal:  No suspicious bone lesions identified. CT ABDOMEN AND PELVIS FINDINGS Hepatobiliary: Several small fluid attenuation cysts are seen in the right and left hepatic lobes. No liver masses identified. Gallbladder is unremarkable. Pancreas:  No mass or inflammatory changes. Spleen:  Within normal limits in size and appearance. Adrenals/Urinary tract:  No masses or hydronephrosis. Stomach/Bowel: Annular colonic soft tissue mass is seen near the rectosigmoid junction which measures 3.5 x 3.6 cm by 4.3 cm. There is no evidence of bowel obstruction. Diverticulosis is seen involving the proximal sigmoid colon, however there is no evidence of diverticulitis. Vascular/Lymphatic: Right pericolonic lymph node is seen measuring 10 mm on image 95/2, suspicious for lymph node metastasis. Tiny 5-6 mm posterior pericolonic lymph nodes are seen in the presacral space, also suspicious for early lymph node metastases. No pathologically enlarged lymph nodes identified within the abdomen. No abdominal aortic aneurysm. Reproductive:  No mass or other significant abnormality identified. Other:  None. Musculoskeletal: No suspicious bone lesions identified. Degenerative lumbar spondylosis incidentally noted. IMPRESSION: 4 cm annular colonic soft tissue mass near the  rectosigmoid junction, consistent with known primary carcinoma. Several small pericolonic lymph nodes measuring up to 10 mm, suspicious for local lymph node metastases. No evidence of distant metastatic disease. Proximal sigmoid diverticulosis.  No evidence of diverticulitis. Electronically Signed   By: Earle Gell M.D.   On: 10/15/2016 15:57   Ct Abdomen Pelvis W Contrast  Result Date: 10/15/2016 CLINICAL DATA:  Newly diagnosed colon carcinoma by colonoscopy. Ulcerative colitis. EXAM: CT CHEST, ABDOMEN, AND PELVIS WITH CONTRAST TECHNIQUE: Multidetector CT  imaging of the chest, abdomen and pelvis was performed following the standard protocol during bolus administration of intravenous contrast. CONTRAST:  156m ISOVUE-300 IOPAMIDOL (ISOVUE-300) INJECTION 61% COMPARISON:  None. FINDINGS: CT CHEST FINDINGS Cardiovascular: No acute findings. Mediastinum/Lymph Nodes: No pathologically enlarged lymph nodes identified. 10 mm low-attenuation left thyroid lobe nodule incidentally noted. Lungs/Pleura: No pulmonary infiltrate or mass identified. No effusion present. Musculoskeletal:  No suspicious bone lesions identified. CT ABDOMEN AND PELVIS FINDINGS Hepatobiliary: Several small fluid attenuation cysts are seen in the right and left hepatic lobes. No liver masses identified. Gallbladder is unremarkable. Pancreas:  No mass or inflammatory changes. Spleen:  Within normal limits in size and appearance. Adrenals/Urinary tract:  No masses or hydronephrosis. Stomach/Bowel: Annular colonic soft tissue mass is seen near the rectosigmoid junction which measures 3.5 x 3.6 cm by 4.3 cm. There is no evidence of bowel obstruction. Diverticulosis is seen involving the proximal sigmoid colon, however there is no evidence of diverticulitis. Vascular/Lymphatic: Right pericolonic lymph node is seen measuring 10 mm on image 95/2, suspicious for lymph node metastasis. Tiny 5-6 mm posterior pericolonic lymph nodes are seen in the presacral space, also suspicious for early lymph node metastases. No pathologically enlarged lymph nodes identified within the abdomen. No abdominal aortic aneurysm. Reproductive:  No mass or other significant abnormality identified. Other:  None. Musculoskeletal: No suspicious bone lesions identified. Degenerative lumbar spondylosis incidentally noted. IMPRESSION: 4 cm annular colonic soft tissue mass near the rectosigmoid junction, consistent with known primary carcinoma. Several small pericolonic lymph nodes measuring up to 10 mm, suspicious for local lymph node  metastases. No evidence of distant metastatic disease. Proximal sigmoid diverticulosis.  No evidence of diverticulitis. Electronically Signed   By: JEarle GellM.D.   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 I discussed the course of treatment, side effects, and potential toxicities associated with treatment. The patient appears to understand and wishes to proceed with radiation. A consent form was signed and a copy was placed in the patient's chart.   PLAN: The patient will begin Xeloda on 11/10/16 with concurrent radiation therapy, and concluding with surgical resection. The patient will be scheduled for CT simulation and treatment planning this week. Anticipate 5.5 weeks of radiation treatment with treatment to begin next week.  Of note, patient will be out of town this afternoon through Thursday. We therefore will have the patient presented for simulation at the end of the week on Friday and will begin as soon as possible next week.      ________________________________   JJodelle Gross MD, PhD   **Disclaimer: This note was dictated with voice recognition software. Similar sounding words can inadvertently be transcribed and this note may contain transcription errors which may not have been corrected upon publication of note.**   This document serves as a record of services personally performed by JKyung Rudd MD. It was created on his behalf by LBethann Humble a trained medical scribe. The  creation of this record is based on the scribe's personal observations and the provider's statements to them. This document has been checked and approved by the attending provider.

## 2016-11-03 NOTE — Progress Notes (Signed)
Please see the Nurse Progress Note in the MD Initial Consult Encounter for this patient. 

## 2016-11-10 ENCOUNTER — Ambulatory Visit
Admission: RE | Admit: 2016-11-10 | Discharge: 2016-11-10 | Disposition: A | Discharge status: Home / Self Care | Payer: Medicare Other | Source: Ambulatory Visit | Attending: Radiation Oncology | Admitting: Radiation Oncology

## 2016-11-10 DIAGNOSIS — C19 Malignant neoplasm of rectosigmoid junction: Secondary | ICD-10-CM | POA: Diagnosis not present

## 2016-11-10 DIAGNOSIS — C20 Malignant neoplasm of rectum: Secondary | ICD-10-CM

## 2016-11-10 DIAGNOSIS — Z853 Personal history of malignant neoplasm of breast: Secondary | ICD-10-CM | POA: Diagnosis not present

## 2016-11-10 DIAGNOSIS — Z510 Encounter for antineoplastic radiation therapy: Secondary | ICD-10-CM | POA: Diagnosis not present

## 2016-11-10 DIAGNOSIS — Z8669 Personal history of other diseases of the nervous system and sense organs: Secondary | ICD-10-CM | POA: Diagnosis not present

## 2016-11-10 NOTE — Progress Notes (Signed)
  Radiation Oncology         [phone] ________________________________  Name: Allison Dunlap MRN: [id number]  Date: 11/10/2016  DOB: Oct 03, 1950  Optical Surface Tracking Plan:  Since intensity modulated radiotherapy (IMRT) and 3D conformal radiation treatment methods are predicated on accurate and precise positioning for treatment, intrafraction motion monitoring is medically necessary to ensure accurate and safe treatment delivery.  The ability to quantify intrafraction motion without excessive ionizing radiation dose can only be performed with optical surface tracking. Accordingly, surface imaging offers the opportunity to obtain 3D measurements of patient position throughout IMRT and 3D treatments without excessive radiation exposure.  I am ordering optical surface tracking for this patient's upcoming course of radiotherapy. ________________________________  Kyung Rudd, MD 11/10/2016 6:09 PM    Reference:   Ursula Alert, J, et al. Surface imaging-based analysis of intrafraction motion for breast radiotherapy patients.Journal of [place], n. 6, nov. 2014. ISSN [id number].   Available at: <http://www.jacmp.org/index.php/jacmp/article/view/4957>.

## 2016-11-10 NOTE — Progress Notes (Signed)
  Radiation Oncology         (336) [phone] ________________________________  Name: Allison Dunlap MRN: [id number]  Date: 11/10/2016  DOB: 1950/08/07   SIMULATION AND TREATMENT PLANNING NOTE  DIAGNOSIS:     ICD-9-CM ICD-10-CM   1. Rectal cancer ([place]) 154.1 C20      The patient presented for simulation for the patient's upcoming course of radiation for the diagnosis of rectal cancer. The patient was placed in a supine position. A customized vac-lock bag was constructed to aid in patient immobilization on. This complex treatment device will be used on a daily basis during the treatment. In this fashion a CT scan was obtained through the pelvic region and the isocenter was placed near midline within the pelvis. Surface markings were placed.  The patient's imaging was loaded into the radiation treatment planning system. The patient will initially be planned to receive a course of radiation to a dose of 45 Gy. This will be accomplished in 25 fractions at 1.8 gray per fraction. This initial treatment will correspond to a 3-D conformal technique. The target has been contoured in addition to the rectum, bladder and femoral heads. Dose volume histograms of each of these structures have been requested and these will be carefully reviewed as part of the 3-D conformal treatment planning process. To accomplish this initial treatment, 4 customized blocks have been designed for this purpose. Each of these 4 complex treatment devices will be used on a daily basis during the initial course of the treatment. It is anticipated that the patient will then receive a boost for an additional 5.4 Gy. The anticipated total dose therefore will be 50.4 Gy.    Special treatment procedure The patient will receive chemotherapy during the course of radiation treatment. The patient may experience increased or overlapping toxicity due to this combined-modality approach and the patient will be monitored for such problems. This may  include extra lab work as necessary. This therefore constitutes a special treatment procedure.    ________________________________  Jodelle Gross, MD, PhD

## 2016-11-11 ENCOUNTER — Other Ambulatory Visit: Payer: Self-pay | Admitting: Hematology

## 2016-11-11 DIAGNOSIS — C20 Malignant neoplasm of rectum: Secondary | ICD-10-CM | POA: Diagnosis not present

## 2016-11-12 ENCOUNTER — Telehealth: Payer: Self-pay | Admitting: Hematology

## 2016-11-12 NOTE — Telephone Encounter (Signed)
sw pt to confirm 4/19 and 4/23 appts per LOS

## 2016-11-13 ENCOUNTER — Other Ambulatory Visit: Payer: Medicare Other

## 2016-11-13 DIAGNOSIS — Z8669 Personal history of other diseases of the nervous system and sense organs: Secondary | ICD-10-CM | POA: Diagnosis not present

## 2016-11-13 DIAGNOSIS — C19 Malignant neoplasm of rectosigmoid junction: Secondary | ICD-10-CM | POA: Diagnosis not present

## 2016-11-13 DIAGNOSIS — Z510 Encounter for antineoplastic radiation therapy: Secondary | ICD-10-CM | POA: Diagnosis not present

## 2016-11-13 DIAGNOSIS — C20 Malignant neoplasm of rectum: Secondary | ICD-10-CM | POA: Diagnosis not present

## 2016-11-13 DIAGNOSIS — Z853 Personal history of malignant neoplasm of breast: Secondary | ICD-10-CM | POA: Diagnosis not present

## 2016-11-13 NOTE — Telephone Encounter (Signed)
Oral Chemotherapy Pharmacist Encounter   I called [facility] for update on patient's Xeloda. Copayment $111.92 on Medicare Part B benefits. Patient picked up from pharmacy on 11/10/16. Noted chemotherapy education scheduled for today (11/13/16).  Oral Oncology Clinic will continue to follow.  Johny Drilling, PharmD, BCPS, BCOP 11/13/2016  9:42 AM Oral Oncology Clinic [phone]

## 2016-11-13 NOTE — Progress Notes (Signed)
[name]  [phone] [phone]  Clinic Follow Up Note   Patient Care Team: No Pcp Per Patient as PCP - General ([place]) Jerene Bears, MD as Consulting Physician (Gastroenterology) Leighton Ruff, MD as Consulting Physician (General Surgery) Kyung Rudd, MD as Consulting Physician (Radiation Oncology) Truitt Merle, MD as Consulting Physician (Hematology) 11/17/2016   CHIEF COMPLAINTS/PURPOSE OF CONSULTATION:  Follow up of diagnosed rectosigmoid cancer     Rectal cancer ([place])   10/14/2016 Initial Diagnosis    Rectal cancer ([place])      10/14/2016 Procedure    Colonoscopy by Dr. Hilarie Fredrickson showed a fungating partially upchucking large mass in the rectosigmoid colon, the mass begins about 10 cm from the dentate line and measured 5 cm in length. Biopsy was obtained. Multiple small and largemouth diverticula were found in the sigmoid colon and the ascending colon.      10/14/2016 Initial Biopsy    Rectosigmoid mass biopsy showed adenocarcinoma       10/15/2016 Imaging    CT of chest, abdomen and pelvis with contrast showed a 4 cm annular colonic soft tissue mass near the rectosigmoid junction, consistent with known primary carcinoma. Several small her chronic lymph nodes measuring up to 12m, suspicious for local lymph node metastasis. No evidence of distant metastasis.       HISTORY OF PRESENTING ILLNESS:  Allison Delawder89y.[id number]. female is here because of Her recently diagnosed rectosigmoid colon cancer. She was referred by her gastroenterologist Dr. PHilarie Fredrickson She presents to my clinic by herself today.  She has been having constipationa and diarrhea for 6 months, and daily hematochezia, a tea spoon each time, no rectal pain, no bloating, nause or other symptoms, no change of appetie and weight. She does not have a primary care physician, and [facility] She was seen by Dr. PHilarie Fredrickson She underwent colonoscopy, which  showed a large mass in the rectosigmoid junction. Biopsy showed adenocarcinoma.  She was diagnosed with ulcerative colitis in her 20's, resolved and now.  She has had thrombocytopenia since 2013, she has bone marrow biopsy, which was normal per pt, never required any treatment    She is widowed, no children, [place] in [place] is not working   CURRENT THERAPY: concurrent chemo Xeloda and radiation, starting on 11/17/2016  INTERVAL HISTORY: Allison Easterlyreturns today for follow up.  The patient reports she is doing well. She has gotten her Xeloda, and reports she understands to take 3 pills in the morning and 2 at night. She reports she is scheduled for her first radiation therapy today. Overall the patient is without complaint.   MEDICAL HISTORY:  Past Medical History:  Diagnosis Date  . [place] ([facility] 10/14/2016   rectal cancer-adenocarcinoma  . Clotting disorder ([facility]   . Temporary low platelet count ([facility] [place]     SURGICAL HISTORY: Past Surgical History:  Procedure Laterality Date  . NO PAST SURGERIES      SOCIAL HISTORY: Social History   Social History  . Marital status: Widowed    Spouse name: N/A  . Number of children: 0  . Years of education: N/A   Occupational History  . retired    Social History Main Topics  . Smoking status: Former Smoker    Packs/day: 0.50    Years: 30.00    Quit date: 07/28/1997  . Smokeless tobacco: Never Used  . Alcohol use 4.2 oz/week    7 Glasses of wine per week  Comment: wine and beer  . Drug use: No  . Sexual activity: Not on file   Other Topics Concern  . Not on file   Social History Narrative  . No narrative on file    FAMILY HISTORY: Family History  Problem Relation Age of Onset  . Alzheimer's disease Mother   . Liver disease Father 50    failure  . Breast cancer Sister   . Cancer Sister 70    breast cancer   . Heart disease Maternal Grandfather   . Cancer Paternal Grandmother     type unknown     ALLERGIES:  has No Known Allergies.  MEDICATIONS:  Current Outpatient Prescriptions  Medication Sig Dispense Refill  . capecitabine (XELODA) 500 MG tablet Take 3 tab in the morning and 2 tab in the evening, on the days of radiation, Monday through Friday (Patient not taking: Reported on 11/03/2016) 150 tablet 0  . Cholecalciferol (VITAMIN D PO) Take 1 tablet by mouth daily.    . Cyanocobalamin (VITAMIN B-12 PO) Take 1 tablet by mouth daily.    . Multiple Vitamins-Minerals (OCUVITE PO) Take 1 tablet by mouth daily.     Current Facility-Administered Medications  Medication Dose Route Frequency Provider Last Rate Last Dose  . 0.9 %  sodium chloride infusion  500 mL Intravenous Continuous Jerene Bears, MD        REVIEW OF SYSTEMS:   Constitutional: Denies fevers, chills or abnormal night sweats Eyes: Denies blurriness of vision, double vision or watery eyes Ears, nose, mouth, throat, and face: Denies mucositis or sore throat Respiratory: Denies cough, dyspnea or wheezes Cardiovascular: Denies palpitation, chest discomfort or lower extremity swelling Gastrointestinal:  Denies nausea, heartburn Skin: Denies abnormal skin rashes Lymphatics: Denies new lymphadenopathy or easy bruising Neurological:Denies numbness, tingling or new weaknesses Behavioral/Psych: Mood is stable, no new changes  All other systems were reviewed with the patient and are negative.  PHYSICAL EXAMINATION: ECOG PERFORMANCE STATUS: 0 - Asymptomatic  Vitals:   11/17/16 1331  BP: (!) 140/95  Pulse: 71  Resp: 18  Temp: 98.3 F (36.8 C)   Filed Weights   11/17/16 1331  Weight: 124 lb 3.2 oz (56.3 kg)    GENERAL:alert, no distress and comfortable SKIN: skin color, texture, turgor are normal, no rashes or significant lesions EYES: normal, conjunctiva are pink and non-injected, sclera clear OROPHARYNX:no exudate, no erythema and lips, buccal mucosa, and tongue normal  NECK: supple, thyroid normal size,  non-tender, without nodularity LYMPH:  no palpable lymphadenopathy in the cervical, axillary or inguinal LUNGS: clear to auscultation and percussion with normal breathing effort HEART: regular rate & rhythm and no murmurs and no lower extremity edema ABDOMEN:abdomen soft, non-tender and normal bowel sounds Musculoskeletal:no cyanosis of digits and no clubbing  PSYCH: alert & oriented x 3 with fluent speech NEURO: no focal motor/sensory deficits  LABORATORY DATA:  I have reviewed the data as listed CBC Latest Ref Rng & Units 11/17/2016 10/10/2016  WBC 3.9 - 10.3 10e3/uL 7.3 6.7  Hemoglobin 11.6 - 15.9 g/dL 14.2 14.9  Hematocrit 34.8 - 46.6 % 43.6 44.9  Platelets 145 - 400 10e3/uL 76(L) 68.0 Repeated and verified X2.(L)   CMP Latest Ref Rng & Units 11/17/2016 10/10/2016  Glucose 70 - 140 mg/dl 95 93  BUN 7.0 - 26.0 mg/dL 11.6 11  Creatinine 0.6 - 1.1 mg/dL 0.7 0.66  Sodium 136 - 145 mEq/L 143 138  Potassium 3.5 - 5.1 mEq/L 4.0 3.6  Chloride 96 - 112  mEq/L - 103  CO2 22 - 29 mEq/L 28 26  Calcium 8.4 - 10.4 mg/dL 9.5 9.4  Total Protein 6.4 - 8.3 g/dL 7.0 7.2  Total Bilirubin 0.20 - 1.20 mg/dL 0.41 0.6  Alkaline Phos 40 - 150 U/L 96 96  AST 5 - 34 U/L 19 19  ALT 0 - 55 U/L 13 11   PATHOLOGY REPORT  Diagnosis 10/14/2016 Colon, biopsy, rectosigmoid tumor - ADENOCARCINOMA.  RADIOGRAPHIC STUDIES: I have personally reviewed the radiological images as listed and agreed with the findings in the report. No results found. Colonoscopy 10/14/2016 - The perianal exam findings include a perianal fungal rash. - The digital rectal exam was normal. - The terminal ileum appeared normal. - A fungating partially obstructing large mass was found in the recto-sigmoid colon. The mass was circumferential. The mass begins about 10 cm from the dentate line and measured five cm in length. Oozing was present. Multiple biopsies were obtained with cold forceps for histology in a targeted manner. Area proximal  to the tumor was tattooed on opposite walls with an injection of 3 mL total of Spot (carbon black). - Multiple small and large-mouthed diverticula were found in the sigmoid colon and descending colon. - The retroflexed view of the distal rectum and anal verge was normal and showed no anal or rectal abnormalities.  ASSESSMENT & PLAN: 66 year old Caucasian female, with past medical history of ulcerative colitis, some cytopenia, presented with diarrhea, constipation, and persistent hematochezia. Months.  1. Rectosigmoid adenocarcinoma, cT3N1M0, stage IIIB, MSI-pending  -I previously reviewed his CT scan findings, colonoscopy and biopsy pathology results in great details with patient. -The tumor is located in the proximal rectum and rectosigmoid junction. -Prior CT scan showed several perirectal lymph nodes, suspicious for node metastasis. She likely has locally advanced disease. -I previously presented her case in our GI tumor Board. Due to the location of the cancer, Dr. Marcello Moores recommends neoadjuvant chemotherapy and radiation, followed by surgical resection. -Tumor Board feels her disease is likely T3 N1 based on CT scan findings, EUS or MRI for staging is probably not needed. -I previously recommend neoadjuvant concurrent chemotherapy with Xeloda or 5-FU. Patient opted Xeloda. - Patient will start radiation therapy today, 11/17/16. - Patient will start Xeloda 1532m in am and 10075min evening, on days of radiation. 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 We'll monitor her CBC closely. -lab weekly   2. History of ITP  -She was found to have some cytopenia many years ago, per patient she underwent a bone marrow biopsy which was unremarkable. This is opposed consistent with ITP.  -She has not required any treatment. Her platelet counts are 76 TODAY on 11/17/16.  This is increased from 685n March 2018. -We previously discussed chemotherapy may cause thrombocytopenia. If needed, I would consider steroids treatment for her ITP during chemo.  - We will monitor the patient's platelets with weekly labs.  3. History of ulcerative colitis -follow up with Dr. PyHilarie Fredrickson-We discussed her that her colitis may exacerbate during the radiation and chemotherapy. -She may use Imodium as needed for diarrhea, she'll follow-up with Dr. paHiginio Planf needed.  Plan - Labs reviewed. - Continue Xeloda as directed. - Follow up in 1 week with APP, then return in 3 and 5 weeks.     Orders Placed This Encounter  Procedures  . CEA    Standing Status:  Standing    Number of Occurrences:   30    Standing Expiration Date:   11/16/2017    All questions were answered. The patient knows to call the clinic with any problems, questions or concerns.  I spent 25 minutes counseling the patient face to face. The total time spent in the appointment was 30 minutes and more than 50% was on counseling.  This document serves as a record of services personally performed by Truitt Merle, MD. It was created on her behalf by Maryla Morrow, a trained medical scribe. The creation of this record is based on the scribe's personal observations and the provider's statements to them. This document has been checked and approved by the attending provider.    Truitt Merle, MD 11/17/2016

## 2016-11-14 ENCOUNTER — Other Ambulatory Visit: Payer: Self-pay | Admitting: *Deleted

## 2016-11-14 DIAGNOSIS — C20 Malignant neoplasm of rectum: Secondary | ICD-10-CM

## 2016-11-17 ENCOUNTER — Encounter: Payer: Self-pay | Admitting: Hematology

## 2016-11-17 ENCOUNTER — Ambulatory Visit
Admission: RE | Admit: 2016-11-17 | Discharge: 2016-11-17 | Disposition: A | Discharge status: Home / Self Care | Payer: Medicare Other | Source: Ambulatory Visit | Attending: Radiation Oncology | Admitting: Radiation Oncology

## 2016-11-17 ENCOUNTER — Ambulatory Visit (HOSPITAL_BASED_OUTPATIENT_CLINIC_OR_DEPARTMENT_OTHER): Payer: Medicare Other | Admitting: Hematology

## 2016-11-17 ENCOUNTER — Other Ambulatory Visit (HOSPITAL_BASED_OUTPATIENT_CLINIC_OR_DEPARTMENT_OTHER): Payer: Medicare Other

## 2016-11-17 VITALS — BP 140/95 | HR 71 | Temp 98.3°F | Resp 18 | Ht 59.0 in | Wt 124.2 lb

## 2016-11-17 DIAGNOSIS — R599 Enlarged lymph nodes, unspecified: Secondary | ICD-10-CM

## 2016-11-17 DIAGNOSIS — Z8669 Personal history of other diseases of the nervous system and sense organs: Secondary | ICD-10-CM | POA: Diagnosis not present

## 2016-11-17 DIAGNOSIS — K5190 Ulcerative colitis, unspecified, without complications: Secondary | ICD-10-CM | POA: Diagnosis not present

## 2016-11-17 DIAGNOSIS — C20 Malignant neoplasm of rectum: Secondary | ICD-10-CM

## 2016-11-17 DIAGNOSIS — Z853 Personal history of malignant neoplasm of breast: Secondary | ICD-10-CM | POA: Diagnosis not present

## 2016-11-17 DIAGNOSIS — C19 Malignant neoplasm of rectosigmoid junction: Secondary | ICD-10-CM | POA: Diagnosis not present

## 2016-11-17 DIAGNOSIS — D693 Immune thrombocytopenic purpura: Secondary | ICD-10-CM

## 2016-11-17 DIAGNOSIS — Z510 Encounter for antineoplastic radiation therapy: Secondary | ICD-10-CM | POA: Diagnosis not present

## 2016-11-17 LAB — CBC WITH DIFFERENTIAL/PLATELET
BASO%: 1.1 % (ref 0.0–2.0)
Basophils Absolute: 0.1 10*3/uL (ref 0.0–0.1)
EOS ABS: 0.1 10*3/uL (ref 0.0–0.5)
EOS%: 1.4 % (ref 0.0–7.0)
HCT: 43.6 % (ref 34.8–46.6)
HGB: 14.2 g/dL (ref 11.6–15.9)
LYMPH%: 26.1 % (ref 14.0–49.7)
MCH: 29.2 pg (ref 25.1–34.0)
MCHC: 32.5 g/dL (ref 31.5–36.0)
MCV: 89.7 fL (ref 79.5–101.0)
MONO#: 0.4 10*3/uL (ref 0.1–0.9)
MONO%: 5.9 % (ref 0.0–14.0)
NEUT%: 65.5 % (ref 38.4–76.8)
NEUTROS ABS: 4.8 10*3/uL (ref 1.5–6.5)
PLATELETS: 76 10*3/uL — AB (ref 145–400)
RBC: 4.86 10*6/uL (ref 3.70–5.45)
RDW: 12.8 % (ref 11.2–14.5)
WBC: 7.3 10*3/uL (ref 3.9–10.3)
lymph#: 1.9 10*3/uL (ref 0.9–3.3)

## 2016-11-17 LAB — COMPREHENSIVE METABOLIC PANEL
ALBUMIN: 3.6 g/dL (ref 3.5–5.0)
ALK PHOS: 96 U/L (ref 40–150)
ALT: 13 U/L (ref 0–55)
AST: 19 U/L (ref 5–34)
Anion Gap: 10 mEq/L (ref 3–11)
BILIRUBIN TOTAL: 0.41 mg/dL (ref 0.20–1.20)
BUN: 11.6 mg/dL (ref 7.0–26.0)
CO2: 28 mEq/L (ref 22–29)
Calcium: 9.5 mg/dL (ref 8.4–10.4)
Chloride: 105 mEq/L (ref 98–109)
Creatinine: 0.7 mg/dL (ref 0.6–1.1)
EGFR: 84 mL/min/{1.73_m2} — AB (ref >90)
Glucose: 95 mg/dl (ref 70–140)
Potassium: 4 mEq/L (ref 3.5–5.1)
Sodium: 143 mEq/L (ref 136–145)
TOTAL PROTEIN: 7 g/dL (ref 6.4–8.3)

## 2016-11-17 LAB — CEA (IN HOUSE-CHCC): CEA (CHCC-In House): 3.72 ng/mL (ref 0.00–5.00)

## 2016-11-18 ENCOUNTER — Ambulatory Visit
Admission: RE | Admit: 2016-11-18 | Discharge: 2016-11-18 | Disposition: A | Discharge status: Home / Self Care | Payer: Medicare Other | Source: Ambulatory Visit | Attending: Radiation Oncology | Admitting: Radiation Oncology

## 2016-11-18 ENCOUNTER — Telehealth: Payer: Self-pay | Admitting: Hematology

## 2016-11-18 DIAGNOSIS — C19 Malignant neoplasm of rectosigmoid junction: Secondary | ICD-10-CM | POA: Diagnosis not present

## 2016-11-18 DIAGNOSIS — C20 Malignant neoplasm of rectum: Secondary | ICD-10-CM | POA: Diagnosis not present

## 2016-11-18 DIAGNOSIS — Z8669 Personal history of other diseases of the nervous system and sense organs: Secondary | ICD-10-CM | POA: Diagnosis not present

## 2016-11-18 DIAGNOSIS — Z853 Personal history of malignant neoplasm of breast: Secondary | ICD-10-CM | POA: Diagnosis not present

## 2016-11-18 DIAGNOSIS — Z510 Encounter for antineoplastic radiation therapy: Secondary | ICD-10-CM | POA: Diagnosis not present

## 2016-11-18 NOTE — Telephone Encounter (Signed)
Patient bypassed scheduling area. Appointments scheduled per 11/17/16 los. Appointment schedule & letter mailed .

## 2016-11-19 ENCOUNTER — Ambulatory Visit
Admission: RE | Admit: 2016-11-19 | Discharge: 2016-11-19 | Disposition: A | Discharge status: Home / Self Care | Payer: Medicare Other | Source: Ambulatory Visit | Attending: Radiation Oncology | Admitting: Radiation Oncology

## 2016-11-19 DIAGNOSIS — Z853 Personal history of malignant neoplasm of breast: Secondary | ICD-10-CM | POA: Diagnosis not present

## 2016-11-19 DIAGNOSIS — Z510 Encounter for antineoplastic radiation therapy: Secondary | ICD-10-CM | POA: Diagnosis not present

## 2016-11-19 DIAGNOSIS — C20 Malignant neoplasm of rectum: Secondary | ICD-10-CM | POA: Diagnosis not present

## 2016-11-19 DIAGNOSIS — Z8669 Personal history of other diseases of the nervous system and sense organs: Secondary | ICD-10-CM | POA: Diagnosis not present

## 2016-11-19 DIAGNOSIS — C19 Malignant neoplasm of rectosigmoid junction: Secondary | ICD-10-CM | POA: Diagnosis not present

## 2016-11-20 ENCOUNTER — Ambulatory Visit
Admission: RE | Admit: 2016-11-20 | Discharge: 2016-11-20 | Disposition: A | Discharge status: Home / Self Care | Payer: Medicare Other | Source: Ambulatory Visit | Attending: Radiation Oncology | Admitting: Radiation Oncology

## 2016-11-20 DIAGNOSIS — C19 Malignant neoplasm of rectosigmoid junction: Secondary | ICD-10-CM | POA: Diagnosis not present

## 2016-11-20 DIAGNOSIS — Z853 Personal history of malignant neoplasm of breast: Secondary | ICD-10-CM | POA: Diagnosis not present

## 2016-11-20 DIAGNOSIS — Z8669 Personal history of other diseases of the nervous system and sense organs: Secondary | ICD-10-CM | POA: Diagnosis not present

## 2016-11-20 DIAGNOSIS — C20 Malignant neoplasm of rectum: Secondary | ICD-10-CM | POA: Diagnosis not present

## 2016-11-20 DIAGNOSIS — Z510 Encounter for antineoplastic radiation therapy: Secondary | ICD-10-CM | POA: Diagnosis not present

## 2016-11-21 ENCOUNTER — Ambulatory Visit
Admission: RE | Admit: 2016-11-21 | Discharge: 2016-11-21 | Disposition: A | Discharge status: Home / Self Care | Payer: Medicare Other | Source: Ambulatory Visit | Attending: Radiation Oncology | Admitting: Radiation Oncology

## 2016-11-21 DIAGNOSIS — Z510 Encounter for antineoplastic radiation therapy: Secondary | ICD-10-CM | POA: Diagnosis not present

## 2016-11-21 DIAGNOSIS — Z853 Personal history of malignant neoplasm of breast: Secondary | ICD-10-CM | POA: Diagnosis not present

## 2016-11-21 DIAGNOSIS — C19 Malignant neoplasm of rectosigmoid junction: Secondary | ICD-10-CM | POA: Diagnosis not present

## 2016-11-21 DIAGNOSIS — C20 Malignant neoplasm of rectum: Secondary | ICD-10-CM | POA: Diagnosis not present

## 2016-11-21 DIAGNOSIS — Z8669 Personal history of other diseases of the nervous system and sense organs: Secondary | ICD-10-CM | POA: Diagnosis not present

## 2016-11-24 ENCOUNTER — Other Ambulatory Visit (HOSPITAL_BASED_OUTPATIENT_CLINIC_OR_DEPARTMENT_OTHER): Payer: Medicare Other

## 2016-11-24 ENCOUNTER — Ambulatory Visit (HOSPITAL_BASED_OUTPATIENT_CLINIC_OR_DEPARTMENT_OTHER): Payer: Medicare Other | Admitting: Nurse Practitioner

## 2016-11-24 ENCOUNTER — Ambulatory Visit
Admission: RE | Admit: 2016-11-24 | Discharge: 2016-11-24 | Disposition: A | Discharge status: Home / Self Care | Payer: Medicare Other | Source: Ambulatory Visit | Attending: Radiation Oncology | Admitting: Radiation Oncology

## 2016-11-24 VITALS — BP 134/85 | HR 77 | Temp 98.0°F | Resp 18 | Wt 125.6 lb

## 2016-11-24 DIAGNOSIS — C19 Malignant neoplasm of rectosigmoid junction: Secondary | ICD-10-CM

## 2016-11-24 DIAGNOSIS — Z510 Encounter for antineoplastic radiation therapy: Secondary | ICD-10-CM | POA: Diagnosis not present

## 2016-11-24 DIAGNOSIS — Z8669 Personal history of other diseases of the nervous system and sense organs: Secondary | ICD-10-CM | POA: Diagnosis not present

## 2016-11-24 DIAGNOSIS — C20 Malignant neoplasm of rectum: Secondary | ICD-10-CM | POA: Diagnosis not present

## 2016-11-24 DIAGNOSIS — Z853 Personal history of malignant neoplasm of breast: Secondary | ICD-10-CM | POA: Diagnosis not present

## 2016-11-24 LAB — COMPREHENSIVE METABOLIC PANEL
ALT: 12 U/L (ref 0–55)
AST: 19 U/L (ref 5–34)
Albumin: 3.6 g/dL (ref 3.5–5.0)
Alkaline Phosphatase: 89 U/L (ref 40–150)
Anion Gap: 10 mEq/L (ref 3–11)
BUN: 9.1 mg/dL (ref 7.0–26.0)
CALCIUM: 9.1 mg/dL (ref 8.4–10.4)
CHLORIDE: 107 meq/L (ref 98–109)
CO2: 22 meq/L (ref 22–29)
CREATININE: 0.7 mg/dL (ref 0.6–1.1)
Glucose: 93 mg/dl (ref 70–140)
POTASSIUM: 3.8 meq/L (ref 3.5–5.1)
Sodium: 140 mEq/L (ref 136–145)
Total Bilirubin: 0.62 mg/dL (ref 0.20–1.20)
Total Protein: 6.6 g/dL (ref 6.4–8.3)

## 2016-11-24 LAB — CBC WITH DIFFERENTIAL/PLATELET
BASO%: 1.3 % (ref 0.0–2.0)
BASOS ABS: 0.1 10*3/uL (ref 0.0–0.1)
EOS%: 3.8 % (ref 0.0–7.0)
Eosinophils Absolute: 0.1 10*3/uL (ref 0.0–0.5)
HEMATOCRIT: 41.4 % (ref 34.8–46.6)
HGB: 13.6 g/dL (ref 11.6–15.9)
LYMPH#: 0.9 10*3/uL (ref 0.9–3.3)
LYMPH%: 23.1 % (ref 14.0–49.7)
MCH: 29.4 pg (ref 25.1–34.0)
MCHC: 32.8 g/dL (ref 31.5–36.0)
MCV: 89.5 fL (ref 79.5–101.0)
MONO#: 0.2 10*3/uL (ref 0.1–0.9)
MONO%: 5.4 % (ref 0.0–14.0)
NEUT#: 2.5 10*3/uL (ref 1.5–6.5)
NEUT%: 66.4 % (ref 38.4–76.8)
PLATELETS: 50 10*3/uL — AB (ref 145–400)
RBC: 4.62 10*6/uL (ref 3.70–5.45)
RDW: 12.7 % (ref 11.2–14.5)
WBC: 3.8 10*3/uL — ABNORMAL LOW (ref 3.9–10.3)

## 2016-11-24 NOTE — Progress Notes (Signed)
  [place] OFFICE PROGRESS NOTE   Diagnosis:  Rectal cancer  INTERVAL HISTORY:   Ms. Punt returns as scheduled. She continues radiation/Xeloda. She denies nausea/vomiting. No mouth sores. No change in baseline bowel habits. She thinks the rectal bleeding is somewhat better. No hand or foot pain or redness.  Objective:  Vital signs in last 24 hours:  Blood pressure 134/85, pulse 77, temperature 98 F (36.7 C), temperature source Oral, resp. rate 18, weight 125 lb 9.6 oz (57 kg), SpO2 100 %.    HEENT: No thrush or ulcers. Resp: Lungs clear bilaterally. Cardio: Regular rate and rhythm. GI: Abdomen soft and nontender. No hepatomegaly. Vascular: No leg edema. Skin: Palms without erythema. No skin breakdown. Skin over face appears dry.    Lab Results:  Lab Results  Component Value Date   WBC 7.3 11/17/2016   HGB 14.2 11/17/2016   HCT 43.6 11/17/2016   MCV 89.7 11/17/2016   PLT 76 (L) 11/17/2016   NEUTROABS 4.8 11/17/2016    Imaging:  No results found.  Medications: I have reviewed the patient's current medications.  Assessment/Plan: 1. Rectosigmoid adenocarcinoma, cT3N1M0, stage IIIB, MSI-pending; initiation of radiation/Xeloda 11/17/2016. 2. History of ITP 3. History of ulcerative colitis followed by Dr. Hilarie Fredrickson.   Disposition: Ms. Gutmann appears stable. She continues radiation/Xeloda. She will return for a follow-up visit in 2 weeks. She will contact the office in the interim with any problems.    Ned Card ANP/GNP-BC   11/24/2016  9:49 AM

## 2016-11-25 ENCOUNTER — Ambulatory Visit
Admission: RE | Admit: 2016-11-25 | Discharge: 2016-11-25 | Disposition: A | Discharge status: Home / Self Care | Payer: Medicare Other | Source: Ambulatory Visit | Attending: Radiation Oncology | Admitting: Radiation Oncology

## 2016-11-25 DIAGNOSIS — C19 Malignant neoplasm of rectosigmoid junction: Secondary | ICD-10-CM | POA: Diagnosis not present

## 2016-11-25 DIAGNOSIS — Z853 Personal history of malignant neoplasm of breast: Secondary | ICD-10-CM | POA: Diagnosis not present

## 2016-11-25 DIAGNOSIS — Z510 Encounter for antineoplastic radiation therapy: Secondary | ICD-10-CM | POA: Diagnosis not present

## 2016-11-25 DIAGNOSIS — Z8669 Personal history of other diseases of the nervous system and sense organs: Secondary | ICD-10-CM | POA: Diagnosis not present

## 2016-11-25 DIAGNOSIS — C20 Malignant neoplasm of rectum: Secondary | ICD-10-CM | POA: Diagnosis not present

## 2016-11-26 ENCOUNTER — Ambulatory Visit
Admission: RE | Admit: 2016-11-26 | Discharge: 2016-11-26 | Disposition: A | Discharge status: Home / Self Care | Payer: Medicare Other | Source: Ambulatory Visit | Attending: Radiation Oncology | Admitting: Radiation Oncology

## 2016-11-26 DIAGNOSIS — Z853 Personal history of malignant neoplasm of breast: Secondary | ICD-10-CM | POA: Diagnosis not present

## 2016-11-26 DIAGNOSIS — C20 Malignant neoplasm of rectum: Secondary | ICD-10-CM | POA: Diagnosis not present

## 2016-11-26 DIAGNOSIS — C19 Malignant neoplasm of rectosigmoid junction: Secondary | ICD-10-CM | POA: Diagnosis not present

## 2016-11-26 DIAGNOSIS — Z8669 Personal history of other diseases of the nervous system and sense organs: Secondary | ICD-10-CM | POA: Diagnosis not present

## 2016-11-26 DIAGNOSIS — Z510 Encounter for antineoplastic radiation therapy: Secondary | ICD-10-CM | POA: Diagnosis not present

## 2016-11-27 ENCOUNTER — Ambulatory Visit
Admission: RE | Admit: 2016-11-27 | Discharge: 2016-11-27 | Disposition: A | Discharge status: Home / Self Care | Payer: Medicare Other | Source: Ambulatory Visit | Attending: Radiation Oncology | Admitting: Radiation Oncology

## 2016-11-27 DIAGNOSIS — Z8669 Personal history of other diseases of the nervous system and sense organs: Secondary | ICD-10-CM | POA: Diagnosis not present

## 2016-11-27 DIAGNOSIS — Z510 Encounter for antineoplastic radiation therapy: Secondary | ICD-10-CM | POA: Diagnosis not present

## 2016-11-27 DIAGNOSIS — Z853 Personal history of malignant neoplasm of breast: Secondary | ICD-10-CM | POA: Diagnosis not present

## 2016-11-27 DIAGNOSIS — C20 Malignant neoplasm of rectum: Secondary | ICD-10-CM | POA: Diagnosis not present

## 2016-11-27 DIAGNOSIS — C19 Malignant neoplasm of rectosigmoid junction: Secondary | ICD-10-CM | POA: Diagnosis not present

## 2016-11-28 ENCOUNTER — Telehealth: Payer: Self-pay | Admitting: Hematology

## 2016-11-28 ENCOUNTER — Ambulatory Visit
Admission: RE | Admit: 2016-11-28 | Discharge: 2016-11-28 | Disposition: A | Discharge status: Home / Self Care | Payer: Medicare Other | Source: Ambulatory Visit | Attending: Radiation Oncology | Admitting: Radiation Oncology

## 2016-11-28 DIAGNOSIS — Z853 Personal history of malignant neoplasm of breast: Secondary | ICD-10-CM | POA: Diagnosis not present

## 2016-11-28 DIAGNOSIS — C19 Malignant neoplasm of rectosigmoid junction: Secondary | ICD-10-CM | POA: Diagnosis not present

## 2016-11-28 DIAGNOSIS — Z8669 Personal history of other diseases of the nervous system and sense organs: Secondary | ICD-10-CM | POA: Diagnosis not present

## 2016-11-28 DIAGNOSIS — Z510 Encounter for antineoplastic radiation therapy: Secondary | ICD-10-CM | POA: Diagnosis not present

## 2016-11-28 DIAGNOSIS — C20 Malignant neoplasm of rectum: Secondary | ICD-10-CM | POA: Diagnosis not present

## 2016-11-28 NOTE — Telephone Encounter (Signed)
No LOS per 11/24/16 visit.

## 2016-12-01 ENCOUNTER — Other Ambulatory Visit (HOSPITAL_BASED_OUTPATIENT_CLINIC_OR_DEPARTMENT_OTHER): Payer: Medicare Other

## 2016-12-01 ENCOUNTER — Ambulatory Visit
Admission: RE | Admit: 2016-12-01 | Discharge: 2016-12-01 | Disposition: A | Discharge status: Home / Self Care | Payer: Medicare Other | Source: Ambulatory Visit | Attending: Radiation Oncology | Admitting: Radiation Oncology

## 2016-12-01 DIAGNOSIS — C19 Malignant neoplasm of rectosigmoid junction: Secondary | ICD-10-CM

## 2016-12-01 DIAGNOSIS — Z8669 Personal history of other diseases of the nervous system and sense organs: Secondary | ICD-10-CM | POA: Diagnosis not present

## 2016-12-01 DIAGNOSIS — C20 Malignant neoplasm of rectum: Secondary | ICD-10-CM | POA: Diagnosis not present

## 2016-12-01 DIAGNOSIS — Z853 Personal history of malignant neoplasm of breast: Secondary | ICD-10-CM | POA: Diagnosis not present

## 2016-12-01 DIAGNOSIS — Z510 Encounter for antineoplastic radiation therapy: Secondary | ICD-10-CM | POA: Diagnosis not present

## 2016-12-01 LAB — CBC WITH DIFFERENTIAL/PLATELET
BASO%: 0.5 % (ref 0.0–2.0)
Basophils Absolute: 0 10*3/uL (ref 0.0–0.1)
EOS%: 6.2 % (ref 0.0–7.0)
Eosinophils Absolute: 0.2 10*3/uL (ref 0.0–0.5)
HCT: 40.2 % (ref 34.8–46.6)
HGB: 13.4 g/dL (ref 11.6–15.9)
LYMPH%: 19.6 % (ref 14.0–49.7)
MCH: 30.1 pg (ref 25.1–34.0)
MCHC: 33.4 g/dL (ref 31.5–36.0)
MCV: 90.2 fL (ref 79.5–101.0)
MONO#: 0.3 10*3/uL (ref 0.1–0.9)
MONO%: 6.9 % (ref 0.0–14.0)
NEUT#: 2.5 10*3/uL (ref 1.5–6.5)
NEUT%: 66.8 % (ref 38.4–76.8)
PLATELETS: 26 10*3/uL — AB (ref 145–400)
RBC: 4.46 10*6/uL (ref 3.70–5.45)
RDW: 12.9 % (ref 11.2–14.5)
WBC: 3.7 10*3/uL — ABNORMAL LOW (ref 3.9–10.3)
lymph#: 0.7 10*3/uL — ABNORMAL LOW (ref 0.9–3.3)

## 2016-12-01 LAB — COMPREHENSIVE METABOLIC PANEL
ALT: 14 U/L (ref 0–55)
ANION GAP: 8 meq/L (ref 3–11)
AST: 17 U/L (ref 5–34)
Albumin: 3.5 g/dL (ref 3.5–5.0)
Alkaline Phosphatase: 83 U/L (ref 40–150)
BUN: 7.5 mg/dL (ref 7.0–26.0)
CHLORIDE: 107 meq/L (ref 98–109)
CO2: 27 meq/L (ref 22–29)
Calcium: 9.1 mg/dL (ref 8.4–10.4)
Creatinine: 0.7 mg/dL (ref 0.6–1.1)
Glucose: 91 mg/dl (ref 70–140)
Potassium: 4 mEq/L (ref 3.5–5.1)
SODIUM: 142 meq/L (ref 136–145)
Total Bilirubin: 0.45 mg/dL (ref 0.20–1.20)
Total Protein: 6.5 g/dL (ref 6.4–8.3)

## 2016-12-02 ENCOUNTER — Ambulatory Visit
Admission: RE | Admit: 2016-12-02 | Discharge: 2016-12-02 | Disposition: A | Discharge status: Home / Self Care | Payer: Medicare Other | Source: Ambulatory Visit | Attending: Radiation Oncology | Admitting: Radiation Oncology

## 2016-12-02 DIAGNOSIS — Z853 Personal history of malignant neoplasm of breast: Secondary | ICD-10-CM | POA: Diagnosis not present

## 2016-12-02 DIAGNOSIS — Z510 Encounter for antineoplastic radiation therapy: Secondary | ICD-10-CM | POA: Diagnosis not present

## 2016-12-02 DIAGNOSIS — Z8669 Personal history of other diseases of the nervous system and sense organs: Secondary | ICD-10-CM | POA: Diagnosis not present

## 2016-12-02 DIAGNOSIS — C20 Malignant neoplasm of rectum: Secondary | ICD-10-CM | POA: Diagnosis not present

## 2016-12-02 DIAGNOSIS — C19 Malignant neoplasm of rectosigmoid junction: Secondary | ICD-10-CM | POA: Diagnosis not present

## 2016-12-03 ENCOUNTER — Ambulatory Visit
Admission: RE | Admit: 2016-12-03 | Discharge: 2016-12-03 | Disposition: A | Discharge status: Home / Self Care | Payer: Medicare Other | Source: Ambulatory Visit | Attending: Radiation Oncology | Admitting: Radiation Oncology

## 2016-12-03 DIAGNOSIS — Z8669 Personal history of other diseases of the nervous system and sense organs: Secondary | ICD-10-CM | POA: Diagnosis not present

## 2016-12-03 DIAGNOSIS — Z853 Personal history of malignant neoplasm of breast: Secondary | ICD-10-CM | POA: Diagnosis not present

## 2016-12-03 DIAGNOSIS — Z510 Encounter for antineoplastic radiation therapy: Secondary | ICD-10-CM | POA: Diagnosis not present

## 2016-12-03 DIAGNOSIS — C20 Malignant neoplasm of rectum: Secondary | ICD-10-CM | POA: Diagnosis not present

## 2016-12-03 DIAGNOSIS — C19 Malignant neoplasm of rectosigmoid junction: Secondary | ICD-10-CM | POA: Diagnosis not present

## 2016-12-04 ENCOUNTER — Ambulatory Visit
Admission: RE | Admit: 2016-12-04 | Discharge: 2016-12-04 | Disposition: A | Discharge status: Home / Self Care | Payer: Medicare Other | Source: Ambulatory Visit | Attending: Radiation Oncology | Admitting: Radiation Oncology

## 2016-12-04 DIAGNOSIS — Z853 Personal history of malignant neoplasm of breast: Secondary | ICD-10-CM | POA: Diagnosis not present

## 2016-12-04 DIAGNOSIS — C19 Malignant neoplasm of rectosigmoid junction: Secondary | ICD-10-CM | POA: Diagnosis not present

## 2016-12-04 DIAGNOSIS — Z8669 Personal history of other diseases of the nervous system and sense organs: Secondary | ICD-10-CM | POA: Diagnosis not present

## 2016-12-04 DIAGNOSIS — Z510 Encounter for antineoplastic radiation therapy: Secondary | ICD-10-CM | POA: Diagnosis not present

## 2016-12-04 DIAGNOSIS — C20 Malignant neoplasm of rectum: Secondary | ICD-10-CM | POA: Diagnosis not present

## 2016-12-04 NOTE — Progress Notes (Signed)
[facility]  [phone] [phone]  Clinic Follow Up Note   Patient Care Team: Patient, No Pcp Per as PCP - General (General Practice) Pyrtle, Lajuan Lines, MD as Consulting Physician (Gastroenterology) Leighton Ruff, MD as Consulting Physician (General Surgery) Kyung Rudd, MD as Consulting Physician (Radiation Oncology) Truitt Merle, MD as Consulting Physician (Hematology) 12/08/2016   CHIEF COMPLAINTS/PURPOSE OF CONSULTATION:  Follow up of diagnosed rectosigmoid cancer   Oncology History   Cancer Staging Rectal cancer [facility]) Staging form: Colon and Rectum, AJCC 8th Edition - Clinical stage from 10/14/2016: Stage IIIB (cT3, cN1, cM0) - Signed by Truitt Merle, MD on 10/24/2016       Rectal cancer ([place])   10/14/2016 Initial Diagnosis    Rectal cancer ([place])      10/14/2016 Procedure    Colonoscopy by Dr. Hilarie Fredrickson showed a fungating partially upchucking large mass in the rectosigmoid colon, the mass begins about 10 cm from the dentate line and measured 5 cm in length. Biopsy was obtained. Multiple small and largemouth diverticula were found in the sigmoid colon and the ascending colon.      10/14/2016 Initial Biopsy    Rectosigmoid mass biopsy showed adenocarcinoma       10/15/2016 Imaging    CT of chest, abdomen and pelvis with contrast showed a 4 cm annular colonic soft tissue mass near the rectosigmoid junction, consistent with known primary carcinoma. Several small her chronic lymph nodes measuring up to 35m, suspicious for local lymph node metastasis. No evidence of distant metastasis.      11/17/2016 -  Radiation Therapy     Radiation Therapy by Dr. MLisbeth Renshaw     11/17/2016 -  Chemotherapy    Concurrent chemo Xeloda (15057min am and 100059mn evening) to go along with radiation.   Hold chemo due to platelet count 12/08/16       HISTORY OF PRESENTING ILLNESS:  Allison Dunlap 66o. female is here because of Her recently diagnosed rectosigmoid  colon cancer. She was referred by her gastroenterologist Dr. PyrHilarie Fredricksonhe presents to my clinic by herself today.  She has been having constipationa and diarrhea for 6 months, and daily hematochezia, a tea spoon each time, no rectal pain, no bloating, nause or other symptoms, no change of appetie and weight. She does not have a primary care physician, and [facility] She was seen by Dr. PyrHilarie Fredricksonhe underwent colonoscopy, which showed a large mass in the rectosigmoid junction. Biopsy showed adenocarcinoma.  She was diagnosed with ulcerative colitis in her 20's, resolved and now.  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 The patient presents ot the clinic today reporting that she is starting her 4th weeks of chemo and radiation. She is getting diarrhea not often and uses imodium to stop it. She denies bleeding and any other pain. Overall she is well      MEDICAL HISTORY:  Past Medical History:  Diagnosis Date  . Cancer (HCC) [place]20/2018   rectal cancer-adenocarcinoma  .  Clotting disorder ([place])   . Temporary low platelet count ([place])     SURGICAL HISTORY: Past Surgical History:  Procedure Laterality Date  . NO PAST SURGERIES      SOCIAL HISTORY: Social History   Social History  . Marital status: Widowed    Spouse name: N/A  . Number of children: 0  . Years of education: N/A   Occupational History  . retired    Social History Main Topics  . Smoking status: Former Smoker     Packs/day: 0.50    Years: 30.00    Quit date: 07/28/1997  . Smokeless tobacco: Never Used  . Alcohol use 4.2 oz/week    7 Glasses of wine per week     Comment: wine and beer  . Drug use: No  . Sexual activity: Not on file   Other Topics Concern  . Not on file   Social History Narrative  . No narrative on file    FAMILY HISTORY: Family History  Problem Relation Age of Onset  . Alzheimer's disease Mother   . Liver disease Father 44       failure  . Breast cancer Sister   . Cancer Sister 28       breast cancer   . Heart disease Maternal Grandfather   . Cancer Paternal Grandmother        type unknown    ALLERGIES:  has No Known Allergies.  MEDICATIONS:  Current Outpatient Prescriptions  Medication Sig Dispense Refill  . capecitabine (XELODA) 500 MG tablet Take 3 tab in the morning and 2 tab in the evening, on the days of radiation, Monday through Friday 150 tablet 0  . Cholecalciferol (VITAMIN D PO) Take 1 tablet by mouth daily.    . Cyanocobalamin (VITAMIN B-12 PO) Take 1 tablet by mouth daily.    . Multiple Vitamins-Minerals (OCUVITE PO) Take 1 tablet by mouth daily.    Marland Kitchen dexamethasone (DECADRON) 4 MG tablet Take 5 tab daily for 4 days 20 tablet 0   Current Facility-Administered Medications  Medication Dose Route Frequency Provider Last Rate Last Dose  . 0.9 %  sodium chloride infusion  500 mL Intravenous Continuous Pyrtle, Lajuan Lines, MD        REVIEW OF SYSTEMS:   Constitutional: Denies fevers, chills or abnormal night sweats Eyes: Denies blurriness of vision, double vision or watery eyes Ears, nose, mouth, throat, and face: Denies mucositis or sore throat Respiratory: Denies cough, dyspnea or wheezes Cardiovascular: Denies palpitation, chest discomfort or lower extremity swelling Gastrointestinal:  Denies nausea, heartburn (+) diarrhea Skin: Denies abnormal skin rashes Lymphatics: Denies new lymphadenopathy or easy bruising Neurological:Denies numbness, tingling or  new weaknesses Behavioral/Psych: Mood is stable, no new changes  All other systems were reviewed with the patient and are negative.  PHYSICAL EXAMINATION: ECOG PERFORMANCE STATUS: 0 - Asymptomatic  Vitals:   12/08/16 1256  BP: 138/66  Pulse: 62  Resp: 18  Temp: 98.4 F (36.9 C)   Filed Weights   12/08/16 1256  Weight: 120 lb 9.6 oz (54.7 kg)    GENERAL:alert, no distress and comfortable SKIN: skin color, texture, turgor are normal, no rashes or significant lesions EYES: normal, conjunctiva are pink and non-injected, sclera clear OROPHARYNX:no exudate, no erythema and lips, buccal mucosa, and tongue normal  NECK: supple, thyroid normal size, non-tender, without nodularity LYMPH:  no palpable lymphadenopathy in the cervical, axillary or inguinal LUNGS: clear to auscultation and percussion with normal breathing effort HEART: regular rate & rhythm  and no murmurs and no lower extremity edema ABDOMEN:abdomen soft, non-tender and normal bowel sounds Musculoskeletal:no cyanosis of digits and no clubbing  PSYCH: alert & oriented x 3 with fluent speech NEURO: no focal motor/sensory deficits  LABORATORY DATA:  I have reviewed the data as listed CBC Latest Ref Rng & Units 12/08/2016 12/01/2016 11/24/2016  WBC 3.9 - 10.3 10e3/uL 4.0 3.7(L) 3.8(L)  Hemoglobin 11.6 - 15.9 g/dL 14.0 13.4 13.6  Hematocrit 34.8 - 46.6 % 42.4 40.2 41.4  Platelets 145 - 400 10e3/uL 19(L) 26(L) 50(L)   CMP Latest Ref Rng & Units 12/08/2016 12/01/2016 11/24/2016  Glucose 70 - 140 mg/dl 87 91 93  BUN 7.0 - 26.0 mg/dL 8.7 7.5 9.1  Creatinine 0.6 - 1.1 mg/dL 0.7 0.7 0.7  Sodium 136 - 145 mEq/L 140 142 140  Potassium 3.5 - 5.1 mEq/L 3.7 4.0 3.8  Chloride 96 - 112 mEq/L - - -  CO2 22 - 29 mEq/L 25 27 22   Calcium 8.4 - 10.4 mg/dL 9.3 9.1 9.1  Total Protein 6.4 - 8.3 g/dL 6.8 6.5 6.6  Total Bilirubin 0.20 - 1.20 mg/dL 0.73 0.45 0.62  Alkaline Phos 40 - 150 U/L 87 83 89  AST 5 - 34 U/L 17 17 19   ALT 0 - 55 U/L 13 14  12    PATHOLOGY REPORT  Diagnosis 10/14/2016 Colon, biopsy, rectosigmoid tumor - ADENOCARCINOMA.  RADIOGRAPHIC STUDIES: I have personally reviewed the radiological images as listed and agreed with the findings in the report. No results found.   CT CAP W Contrast: 10/15/16 IMPRESSION: 4 cm annular colonic soft tissue mass near the rectosigmoid junction, consistent with known primary carcinoma. Several small pericolonic lymph nodes measuring up to 10 mm, suspicious for local lymph node metastases. No evidence of distant metastatic disease. Proximal sigmoid diverticulosis.  No evidence of diverticulitis.  Colonoscopy 10/14/2016 - The perianal exam findings include a perianal fungal rash. - The digital rectal exam was normal. - The terminal ileum appeared normal. - A fungating partially obstructing large mass was found in the recto-sigmoid colon. The mass was circumferential. The mass begins about 10 cm from the dentate line and measured five cm in length. Oozing was present. Multiple biopsies were obtained with cold forceps for histology in a targeted manner. Area proximal to the tumor was tattooed on opposite walls with an injection of 3 mL total of Spot (carbon black). - Multiple small and large-mouthed diverticula were found in the sigmoid colon and descending colon. - The retroflexed view of the distal rectum and anal verge was normal and showed no anal or rectal abnormalities.  ASSESSMENT & PLAN: 66 y.o.  Caucasian female, with past medical history of ulcerative colitis, some cytopenia, presented with diarrhea, constipation, and persistent hematochezia. Months.  1. Rectosigmoid adenocarcinoma, cT3N1M0, stage IIIB, MSI-pending  -I previously reviewed his CT scan findings, colonoscopy and biopsy pathology results in great details with patient. -The tumor is located in the proximal rectum and rectosigmoid junction. -Prior CT scan showed several perirectal lymph nodes, suspicious  for node metastasis. She likely has locally advanced disease. -I previously presented her case in our GI tumor Board. Due to the location of the cancer, Dr. Marcello Moores recommends neoadjuvant chemotherapy and radiation, followed by surgical resection. -Tumor Board feels her disease is likely T3 N1 based on CT scan findings, EUS or MRI for staging is probably not needed. -I previously recommend neoadjuvant concurrent chemotherapy with Xeloda or 5-FU. Patient opted Xeloda. - Patient has start concurrent chemo and radiation  therapy on 11/17/16, tolerating well so far -Lab results reviewed with patient. She has developed a severe thrombocytopenia, platelet 19K today. 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 I'll hold her Xeloda for the next week. -If her some cytopenia resolves after pulse steroids, I may resume Xeloda next week.  2. 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 This is most consistent with ITP.  -She has not required any treatment. Her platelet counts are 76 on 11/17/16. This is increased from 21 in March 2018. -We previously discussed chemotherapy may cause thrombocytopenia. If needed, I would consider steroids treatment for her ITP during chemo.  -Due to low platelet count of 19K (12/08/16) we will stop Xeloda. while we monitor platelet.  -I recommend her to take steroid dexamethasone 46m every a day for 4 days for her ITP  -We discussed to avoid injury due to her risk of bleeding from thrombocytopenia   3. History of ulcerative colitis -follow up with Dr. PHilarie Fredrickson -We previously discussed her that her colitis may exacerbate during the radiation and chemotherapy. -She may use Imodium as needed for diarrhea, she'll follow-up with Dr. PHilarie Fredricksonif needed.  Plan -lab on 5/17 and 5/21 to monitor thrombocytopenia -f/u in one week with APP  -Continue  with radiation -will hold Xeloda from now on due to severe thrombocytopenia -She will start dexamethasone 20 mg daily for 4 days tomorrow, for her ITP. -If her thrombocytopenia improves, plt>100K next week, we'll resume Xeloda. -I will see her back in 2 weeks       No orders of the defined types were placed in this encounter.   All questions were answered. The patient knows to call the clinic with any problems, questions or concerns.  I spent 25 minutes counseling the patient face to face. The total time spent in the appointment was 30 minutes and more than 50% was on counseling.  This document serves as a record of services personally performed by YTruitt Merle MD. It was created on her behalf by AJoslyn Devon a trained medical scribe. The creation of this record is based on the scribe's personal observations and the provider's statements to them. This document has been checked and approved by the attending provider.     FTruitt Merle MD 12/08/2016

## 2016-12-05 ENCOUNTER — Ambulatory Visit
Admission: RE | Admit: 2016-12-05 | Discharge: 2016-12-05 | Disposition: A | Discharge status: Home / Self Care | Payer: Medicare Other | Source: Ambulatory Visit | Attending: Radiation Oncology | Admitting: Radiation Oncology

## 2016-12-05 DIAGNOSIS — Z853 Personal history of malignant neoplasm of breast: Secondary | ICD-10-CM | POA: Diagnosis not present

## 2016-12-05 DIAGNOSIS — Z8669 Personal history of other diseases of the nervous system and sense organs: Secondary | ICD-10-CM | POA: Diagnosis not present

## 2016-12-05 DIAGNOSIS — C20 Malignant neoplasm of rectum: Secondary | ICD-10-CM | POA: Diagnosis not present

## 2016-12-05 DIAGNOSIS — Z510 Encounter for antineoplastic radiation therapy: Secondary | ICD-10-CM | POA: Diagnosis not present

## 2016-12-05 DIAGNOSIS — C19 Malignant neoplasm of rectosigmoid junction: Secondary | ICD-10-CM | POA: Diagnosis not present

## 2016-12-08 ENCOUNTER — Telehealth: Payer: Self-pay | Admitting: Hematology

## 2016-12-08 ENCOUNTER — Other Ambulatory Visit (HOSPITAL_BASED_OUTPATIENT_CLINIC_OR_DEPARTMENT_OTHER): Payer: Medicare Other

## 2016-12-08 ENCOUNTER — Ambulatory Visit: Payer: Medicare Other | Admitting: Hematology

## 2016-12-08 ENCOUNTER — Ambulatory Visit
Admission: RE | Admit: 2016-12-08 | Discharge: 2016-12-08 | Disposition: A | Discharge status: Home / Self Care | Payer: Medicare Other | Source: Ambulatory Visit | Attending: Radiation Oncology | Admitting: Radiation Oncology

## 2016-12-08 ENCOUNTER — Ambulatory Visit (HOSPITAL_BASED_OUTPATIENT_CLINIC_OR_DEPARTMENT_OTHER): Payer: Medicare Other | Admitting: Hematology

## 2016-12-08 VITALS — BP 138/66 | HR 62 | Temp 98.4°F | Resp 18 | Ht 59.0 in | Wt 120.6 lb

## 2016-12-08 DIAGNOSIS — C19 Malignant neoplasm of rectosigmoid junction: Secondary | ICD-10-CM

## 2016-12-08 DIAGNOSIS — D693 Immune thrombocytopenic purpura: Secondary | ICD-10-CM | POA: Diagnosis not present

## 2016-12-08 DIAGNOSIS — Z853 Personal history of malignant neoplasm of breast: Secondary | ICD-10-CM | POA: Diagnosis not present

## 2016-12-08 DIAGNOSIS — D759 Disease of blood and blood-forming organs, unspecified: Secondary | ICD-10-CM | POA: Diagnosis not present

## 2016-12-08 DIAGNOSIS — K5190 Ulcerative colitis, unspecified, without complications: Secondary | ICD-10-CM | POA: Diagnosis not present

## 2016-12-08 DIAGNOSIS — C20 Malignant neoplasm of rectum: Secondary | ICD-10-CM

## 2016-12-08 DIAGNOSIS — Z510 Encounter for antineoplastic radiation therapy: Secondary | ICD-10-CM | POA: Diagnosis not present

## 2016-12-08 DIAGNOSIS — Z8669 Personal history of other diseases of the nervous system and sense organs: Secondary | ICD-10-CM | POA: Diagnosis not present

## 2016-12-08 LAB — COMPREHENSIVE METABOLIC PANEL
ALBUMIN: 3.7 g/dL (ref 3.5–5.0)
ALT: 13 U/L (ref 0–55)
AST: 17 U/L (ref 5–34)
Alkaline Phosphatase: 87 U/L (ref 40–150)
Anion Gap: 9 mEq/L (ref 3–11)
BUN: 8.7 mg/dL (ref 7.0–26.0)
CALCIUM: 9.3 mg/dL (ref 8.4–10.4)
CHLORIDE: 106 meq/L (ref 98–109)
CO2: 25 meq/L (ref 22–29)
Creatinine: 0.7 mg/dL (ref 0.6–1.1)
EGFR: 85 mL/min/{1.73_m2} — ABNORMAL LOW (ref >90)
Glucose: 87 mg/dl (ref 70–140)
POTASSIUM: 3.7 meq/L (ref 3.5–5.1)
Sodium: 140 mEq/L (ref 136–145)
Total Bilirubin: 0.73 mg/dL (ref 0.20–1.20)
Total Protein: 6.8 g/dL (ref 6.4–8.3)

## 2016-12-08 LAB — CBC WITH DIFFERENTIAL/PLATELET
BASO%: 1 % (ref 0.0–2.0)
Basophils Absolute: 0 10*3/uL (ref 0.0–0.1)
EOS%: 5.8 % (ref 0.0–7.0)
Eosinophils Absolute: 0.2 10*3/uL (ref 0.0–0.5)
HEMATOCRIT: 42.4 % (ref 34.8–46.6)
HGB: 14 g/dL (ref 11.6–15.9)
LYMPH%: 13.5 % — ABNORMAL LOW (ref 14.0–49.7)
MCH: 29.9 pg (ref 25.1–34.0)
MCHC: 33.1 g/dL (ref 31.5–36.0)
MCV: 90.3 fL (ref 79.5–101.0)
MONO#: 0.3 10*3/uL (ref 0.1–0.9)
MONO%: 7.5 % (ref 0.0–14.0)
NEUT#: 2.9 10*3/uL (ref 1.5–6.5)
NEUT%: 72.2 % (ref 38.4–76.8)
Platelets: 19 10*3/uL — ABNORMAL LOW (ref 145–400)
RBC: 4.7 10*6/uL (ref 3.70–5.45)
RDW: 13.2 % (ref 11.2–14.5)
WBC: 4 10*3/uL (ref 3.9–10.3)
lymph#: 0.5 10*3/uL — ABNORMAL LOW (ref 0.9–3.3)

## 2016-12-08 MED ORDER — DEXAMETHASONE 4 MG PO TABS: ORAL_TABLET | ORAL | 0 refills | Status: DC

## 2016-12-08 MED FILL — DEXAMETHASONE 4 MG TABLET: 4 | 4 days supply | Qty: 20 | Fill #0

## 2016-12-08 NOTE — Telephone Encounter (Signed)
Gave patient AVS and calender per 5/14 los.

## 2016-12-09 ENCOUNTER — Encounter: Payer: Self-pay | Admitting: Hematology

## 2016-12-09 ENCOUNTER — Ambulatory Visit
Admission: RE | Admit: 2016-12-09 | Discharge: 2016-12-09 | Disposition: A | Discharge status: Home / Self Care | Payer: Medicare Other | Source: Ambulatory Visit | Attending: Radiation Oncology | Admitting: Radiation Oncology

## 2016-12-09 DIAGNOSIS — C19 Malignant neoplasm of rectosigmoid junction: Secondary | ICD-10-CM | POA: Diagnosis not present

## 2016-12-09 DIAGNOSIS — C20 Malignant neoplasm of rectum: Secondary | ICD-10-CM | POA: Diagnosis not present

## 2016-12-09 DIAGNOSIS — Z853 Personal history of malignant neoplasm of breast: Secondary | ICD-10-CM | POA: Diagnosis not present

## 2016-12-09 DIAGNOSIS — Z8669 Personal history of other diseases of the nervous system and sense organs: Secondary | ICD-10-CM | POA: Diagnosis not present

## 2016-12-09 DIAGNOSIS — Z510 Encounter for antineoplastic radiation therapy: Secondary | ICD-10-CM | POA: Diagnosis not present

## 2016-12-10 ENCOUNTER — Ambulatory Visit
Admission: RE | Admit: 2016-12-10 | Discharge: 2016-12-10 | Disposition: A | Discharge status: Home / Self Care | Payer: Medicare Other | Source: Ambulatory Visit | Attending: Radiation Oncology | Admitting: Radiation Oncology

## 2016-12-10 DIAGNOSIS — C20 Malignant neoplasm of rectum: Secondary | ICD-10-CM | POA: Diagnosis not present

## 2016-12-10 DIAGNOSIS — Z8669 Personal history of other diseases of the nervous system and sense organs: Secondary | ICD-10-CM | POA: Diagnosis not present

## 2016-12-10 DIAGNOSIS — Z510 Encounter for antineoplastic radiation therapy: Secondary | ICD-10-CM | POA: Diagnosis not present

## 2016-12-10 DIAGNOSIS — Z853 Personal history of malignant neoplasm of breast: Secondary | ICD-10-CM | POA: Diagnosis not present

## 2016-12-10 DIAGNOSIS — C19 Malignant neoplasm of rectosigmoid junction: Secondary | ICD-10-CM | POA: Diagnosis not present

## 2016-12-11 ENCOUNTER — Other Ambulatory Visit (HOSPITAL_BASED_OUTPATIENT_CLINIC_OR_DEPARTMENT_OTHER): Payer: Medicare Other

## 2016-12-11 ENCOUNTER — Ambulatory Visit
Admission: RE | Admit: 2016-12-11 | Discharge: 2016-12-11 | Disposition: A | Discharge status: Home / Self Care | Payer: Medicare Other | Source: Ambulatory Visit | Attending: Radiation Oncology | Admitting: Radiation Oncology

## 2016-12-11 DIAGNOSIS — Z510 Encounter for antineoplastic radiation therapy: Secondary | ICD-10-CM | POA: Diagnosis not present

## 2016-12-11 DIAGNOSIS — C19 Malignant neoplasm of rectosigmoid junction: Secondary | ICD-10-CM

## 2016-12-11 DIAGNOSIS — Z8669 Personal history of other diseases of the nervous system and sense organs: Secondary | ICD-10-CM | POA: Diagnosis not present

## 2016-12-11 DIAGNOSIS — C20 Malignant neoplasm of rectum: Secondary | ICD-10-CM | POA: Diagnosis not present

## 2016-12-11 DIAGNOSIS — Z853 Personal history of malignant neoplasm of breast: Secondary | ICD-10-CM | POA: Diagnosis not present

## 2016-12-11 LAB — CBC WITH DIFFERENTIAL/PLATELET
BASO%: 0.1 % (ref 0.0–2.0)
Basophils Absolute: 0 10*3/uL (ref 0.0–0.1)
EOS%: 0.1 % (ref 0.0–7.0)
Eosinophils Absolute: 0 10*3/uL (ref 0.0–0.5)
HEMATOCRIT: 39.6 % (ref 34.8–46.6)
HGB: 13.2 g/dL (ref 11.6–15.9)
LYMPH#: 0.3 10*3/uL — AB (ref 0.9–3.3)
LYMPH%: 3.6 % — AB (ref 14.0–49.7)
MCH: 30.3 pg (ref 25.1–34.0)
MCHC: 33.3 g/dL (ref 31.5–36.0)
MCV: 91 fL (ref 79.5–101.0)
MONO#: 0.2 10*3/uL (ref 0.1–0.9)
MONO%: 2.7 % (ref 0.0–14.0)
NEUT#: 7.3 10*3/uL — ABNORMAL HIGH (ref 1.5–6.5)
NEUT%: 93.5 % — ABNORMAL HIGH (ref 38.4–76.8)
Platelets: 53 10*3/uL — ABNORMAL LOW (ref 145–400)
RBC: 4.35 10*6/uL (ref 3.70–5.45)
RDW: 15 % — ABNORMAL HIGH (ref 11.2–14.5)
WBC: 7.8 10*3/uL (ref 3.9–10.3)

## 2016-12-11 LAB — COMPREHENSIVE METABOLIC PANEL
ALT: 12 U/L (ref 0–55)
AST: 15 U/L (ref 5–34)
Albumin: 3.8 g/dL (ref 3.5–5.0)
Alkaline Phosphatase: 82 U/L (ref 40–150)
Anion Gap: 7 mEq/L (ref 3–11)
BUN: 12.7 mg/dL (ref 7.0–26.0)
CHLORIDE: 105 meq/L (ref 98–109)
CO2: 27 meq/L (ref 22–29)
CREATININE: 0.8 mg/dL (ref 0.6–1.1)
Calcium: 9.8 mg/dL (ref 8.4–10.4)
EGFR: 80 mL/min/{1.73_m2} — ABNORMAL LOW (ref >90)
Glucose: 105 mg/dl (ref 70–140)
Potassium: 3.8 mEq/L (ref 3.5–5.1)
Sodium: 139 mEq/L (ref 136–145)
Total Bilirubin: 0.62 mg/dL (ref 0.20–1.20)
Total Protein: 6.7 g/dL (ref 6.4–8.3)

## 2016-12-12 ENCOUNTER — Ambulatory Visit
Admission: RE | Admit: 2016-12-12 | Discharge: 2016-12-12 | Disposition: A | Discharge status: Home / Self Care | Payer: Medicare Other | Source: Ambulatory Visit | Attending: Radiation Oncology | Admitting: Radiation Oncology

## 2016-12-12 DIAGNOSIS — Z853 Personal history of malignant neoplasm of breast: Secondary | ICD-10-CM | POA: Diagnosis not present

## 2016-12-12 DIAGNOSIS — C20 Malignant neoplasm of rectum: Secondary | ICD-10-CM | POA: Diagnosis not present

## 2016-12-12 DIAGNOSIS — C19 Malignant neoplasm of rectosigmoid junction: Secondary | ICD-10-CM | POA: Diagnosis not present

## 2016-12-12 DIAGNOSIS — Z510 Encounter for antineoplastic radiation therapy: Secondary | ICD-10-CM | POA: Diagnosis not present

## 2016-12-12 DIAGNOSIS — Z8669 Personal history of other diseases of the nervous system and sense organs: Secondary | ICD-10-CM | POA: Diagnosis not present

## 2016-12-15 ENCOUNTER — Other Ambulatory Visit (HOSPITAL_BASED_OUTPATIENT_CLINIC_OR_DEPARTMENT_OTHER): Payer: Medicare Other

## 2016-12-15 ENCOUNTER — Ambulatory Visit (HOSPITAL_BASED_OUTPATIENT_CLINIC_OR_DEPARTMENT_OTHER): Payer: Medicare Other | Admitting: Nurse Practitioner

## 2016-12-15 ENCOUNTER — Ambulatory Visit
Admission: RE | Admit: 2016-12-15 | Discharge: 2016-12-15 | Disposition: A | Discharge status: Home / Self Care | Payer: Medicare Other | Source: Ambulatory Visit | Attending: Radiation Oncology | Admitting: Radiation Oncology

## 2016-12-15 VITALS — BP 115/72 | HR 71 | Temp 98.6°F | Resp 18 | Ht 59.0 in | Wt 120.0 lb

## 2016-12-15 DIAGNOSIS — Z8669 Personal history of other diseases of the nervous system and sense organs: Secondary | ICD-10-CM | POA: Diagnosis not present

## 2016-12-15 DIAGNOSIS — C20 Malignant neoplasm of rectum: Secondary | ICD-10-CM

## 2016-12-15 DIAGNOSIS — C19 Malignant neoplasm of rectosigmoid junction: Secondary | ICD-10-CM

## 2016-12-15 DIAGNOSIS — Z862 Personal history of diseases of the blood and blood-forming organs and certain disorders involving the immune mechanism: Secondary | ICD-10-CM

## 2016-12-15 DIAGNOSIS — D6959 Other secondary thrombocytopenia: Secondary | ICD-10-CM | POA: Diagnosis not present

## 2016-12-15 DIAGNOSIS — Z853 Personal history of malignant neoplasm of breast: Secondary | ICD-10-CM | POA: Diagnosis not present

## 2016-12-15 DIAGNOSIS — D693 Immune thrombocytopenic purpura: Secondary | ICD-10-CM

## 2016-12-15 DIAGNOSIS — K5190 Ulcerative colitis, unspecified, without complications: Secondary | ICD-10-CM | POA: Diagnosis not present

## 2016-12-15 DIAGNOSIS — Z510 Encounter for antineoplastic radiation therapy: Secondary | ICD-10-CM | POA: Diagnosis not present

## 2016-12-15 LAB — COMPREHENSIVE METABOLIC PANEL
ALBUMIN: 3.5 g/dL (ref 3.5–5.0)
ALK PHOS: 92 U/L (ref 40–150)
ALT: 18 U/L (ref 0–55)
ANION GAP: 7 meq/L (ref 3–11)
AST: 15 U/L (ref 5–34)
BILIRUBIN TOTAL: 0.97 mg/dL (ref 0.20–1.20)
BUN: 8.9 mg/dL (ref 7.0–26.0)
CO2: 29 meq/L (ref 22–29)
Calcium: 9.2 mg/dL (ref 8.4–10.4)
Chloride: 104 mEq/L (ref 98–109)
Creatinine: 0.8 mg/dL (ref 0.6–1.1)
EGFR: 78 mL/min/{1.73_m2} — AB (ref >90)
GLUCOSE: 101 mg/dL (ref 70–140)
POTASSIUM: 4.2 meq/L (ref 3.5–5.1)
SODIUM: 141 meq/L (ref 136–145)
TOTAL PROTEIN: 6.3 g/dL — AB (ref 6.4–8.3)

## 2016-12-15 LAB — CEA (IN HOUSE-CHCC): CEA (CHCC-In House): 4.56 ng/mL (ref 0.00–5.00)

## 2016-12-15 LAB — CBC WITH DIFFERENTIAL/PLATELET
BASO%: 0.3 % (ref 0.0–2.0)
Basophils Absolute: 0 10*3/uL (ref 0.0–0.1)
EOS%: 8.9 % — ABNORMAL HIGH (ref 0.0–7.0)
Eosinophils Absolute: 0.3 10*3/uL (ref 0.0–0.5)
HCT: 42.8 % (ref 34.8–46.6)
HGB: 14.1 g/dL (ref 11.6–15.9)
LYMPH#: 0.4 10*3/uL — AB (ref 0.9–3.3)
LYMPH%: 9.9 % — ABNORMAL LOW (ref 14.0–49.7)
MCH: 30.5 pg (ref 25.1–34.0)
MCHC: 32.9 g/dL (ref 31.5–36.0)
MCV: 92.6 fL (ref 79.5–101.0)
MONO#: 0.3 10*3/uL (ref 0.1–0.9)
MONO%: 7.8 % (ref 0.0–14.0)
NEUT#: 2.7 10*3/uL (ref 1.5–6.5)
NEUT%: 73.1 % (ref 38.4–76.8)
PLATELETS: 22 10*3/uL — AB (ref 145–400)
RBC: 4.62 10*6/uL (ref 3.70–5.45)
RDW: 15.5 % — ABNORMAL HIGH (ref 11.2–14.5)
WBC: 3.7 10*3/uL — ABNORMAL LOW (ref 3.9–10.3)

## 2016-12-15 NOTE — Progress Notes (Signed)
Allison Dunlap OFFICE PROGRESS NOTE   Diagnosis:  Rectosigmoid cancer Oncology History   Cancer Staging Rectal cancer [facility]) Staging form: Colon and Rectum, AJCC 8th Edition - Clinical stage from 10/14/2016: Stage IIIB (cT3, cN1, cM0) - Signed by Truitt Merle, MD on 10/24/2016       Rectal cancer ([place])   10/14/2016 Initial Diagnosis    Rectal cancer ([place])      10/14/2016 Procedure    Colonoscopy by Dr. Hilarie Fredrickson showed a fungating partially upchucking large mass in the rectosigmoid colon, the mass begins about 10 cm from the dentate line and measured 5 cm in length. Biopsy was obtained. Multiple small and largemouth diverticula were found in the sigmoid colon and the ascending colon.      10/14/2016 Initial Biopsy    Rectosigmoid mass biopsy showed adenocarcinoma       10/15/2016 Imaging    CT of chest, abdomen and pelvis with contrast showed a 4 cm annular colonic soft tissue mass near the rectosigmoid junction, consistent with known primary carcinoma. Several small her chronic lymph nodes measuring up to 28m, suspicious for local lymph node metastasis. No evidence of distant metastasis.      11/17/2016 -  Radiation Therapy     Radiation Therapy by Dr. MLisbeth Renshaw     11/17/2016 -  Chemotherapy    Concurrent chemo Xeloda (15018min am and 100085mn evening) to go along with radiation.   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 She began dexamethasone 20 mg daily for 4 days beginning 12/09/2016.She reports "feeling good" well and the steroids. She denies any bleeding. No nausea or vomiting. No mouth sores. No hand or foot pain  or redness. She continues to have loose stools. She reports good fluid intake.  Objective:  Vital signs in last 24 hours:  Blood pressure 115/72, pulse 71, temperature 98.6 F (37 C), temperature source Oral, resp. rate 18, height 4' 11"  (1.499 m), weight 120 lb (54.4 kg), SpO2 100 %.    HEENT: No thrush or ulcers. No bleeding within the oral cavity. Resp: Lungs clear bilaterally. Cardio: Regular rate and rhythm. GI: Abdomen soft and nontender. No hepatomegaly. Vascular: No leg edema. Neuro: Alert and oriented.  Skin: No petechiae.    Lab Results:  Lab Results  Component Value Date   WBC 3.7 (L) 12/15/2016   HGB 14.1 12/15/2016   HCT 42.8 12/15/2016   MCV 92.6 12/15/2016   PLT 22 (L) 12/15/2016   NEUTROABS 2.7 12/15/2016    Imaging:  No results found.  Medications: I have reviewed the patient's current medications.  Assessment/Plan: 1. Rectosigmoid adenocarcinoma, cT3N1M0, stage IIIB; initiation of radiation/Xeloda 11/17/2016; Xeloda placed on hold beginning 12/08/2016 due to thrombocytopenia. 2. History of ITP 3. History of ulcerative colitis followed by Dr. PyrtlHilarie Fredricksonevere thrombocytopenia related to ITP and chemotherapy; initial improvement with a 4 day course of dexamethasone beginning 12/09/2016   Disposition: Allison Dunlap stable. There was initial improvement in the platelet count with steroids. Platelet count today returned at 22,000. We discussed the thrombocytopenia is most likely due to a combination of ITP and chemotherapy, possible contribution from radiation. She will  continue to hold Xeloda. Continue radiation. Dr. Burr Medico recommends IVIG 1. We will try to get this scheduled 12/19/2016. We discussed the possibility of an allergic reaction. Allison Dunlap is agreeable to proceed.  She will return for a CBC on 12/17/2016 and 12/19/2016. She will return for a follow-up visit on 12/23/2016. She will contact the office in the interim with any problems. We  specifically discussed bruising/bleeding.  Patient seen with Dr. Burr Medico.     Ned Card ANP/GNP-BC   12/15/2016  2:43 PM

## 2016-12-16 ENCOUNTER — Ambulatory Visit
Admission: RE | Admit: 2016-12-16 | Discharge: 2016-12-16 | Disposition: A | Discharge status: Home / Self Care | Payer: Medicare Other | Source: Ambulatory Visit | Attending: Radiation Oncology | Admitting: Radiation Oncology

## 2016-12-16 DIAGNOSIS — Z853 Personal history of malignant neoplasm of breast: Secondary | ICD-10-CM | POA: Diagnosis not present

## 2016-12-16 DIAGNOSIS — Z510 Encounter for antineoplastic radiation therapy: Secondary | ICD-10-CM | POA: Diagnosis not present

## 2016-12-16 DIAGNOSIS — C19 Malignant neoplasm of rectosigmoid junction: Secondary | ICD-10-CM | POA: Diagnosis not present

## 2016-12-16 DIAGNOSIS — C20 Malignant neoplasm of rectum: Secondary | ICD-10-CM | POA: Diagnosis not present

## 2016-12-16 DIAGNOSIS — Z8669 Personal history of other diseases of the nervous system and sense organs: Secondary | ICD-10-CM | POA: Diagnosis not present

## 2016-12-17 ENCOUNTER — Ambulatory Visit
Admission: RE | Admit: 2016-12-17 | Discharge: 2016-12-17 | Disposition: A | Discharge status: Home / Self Care | Payer: Medicare Other | Source: Ambulatory Visit | Attending: Radiation Oncology | Admitting: Radiation Oncology

## 2016-12-17 ENCOUNTER — Ambulatory Visit (HOSPITAL_BASED_OUTPATIENT_CLINIC_OR_DEPARTMENT_OTHER): Payer: Medicare Other

## 2016-12-17 ENCOUNTER — Telehealth: Payer: Self-pay | Admitting: Hematology

## 2016-12-17 ENCOUNTER — Telehealth: Payer: Self-pay | Admitting: *Deleted

## 2016-12-17 DIAGNOSIS — Z510 Encounter for antineoplastic radiation therapy: Secondary | ICD-10-CM | POA: Diagnosis not present

## 2016-12-17 DIAGNOSIS — C20 Malignant neoplasm of rectum: Secondary | ICD-10-CM | POA: Diagnosis not present

## 2016-12-17 DIAGNOSIS — Z8669 Personal history of other diseases of the nervous system and sense organs: Secondary | ICD-10-CM | POA: Diagnosis not present

## 2016-12-17 DIAGNOSIS — C19 Malignant neoplasm of rectosigmoid junction: Secondary | ICD-10-CM | POA: Diagnosis present

## 2016-12-17 DIAGNOSIS — Z862 Personal history of diseases of the blood and blood-forming organs and certain disorders involving the immune mechanism: Secondary | ICD-10-CM

## 2016-12-17 DIAGNOSIS — Z853 Personal history of malignant neoplasm of breast: Secondary | ICD-10-CM | POA: Diagnosis not present

## 2016-12-17 LAB — CBC WITH DIFFERENTIAL/PLATELET
BASO%: 1.1 % (ref 0.0–2.0)
Basophils Absolute: 0.1 10*3/uL (ref 0.0–0.1)
EOS%: 9.5 % — AB (ref 0.0–7.0)
Eosinophils Absolute: 0.5 10*3/uL (ref 0.0–0.5)
HCT: 44.3 % (ref 34.8–46.6)
HGB: 15 g/dL (ref 11.6–15.9)
LYMPH%: 9.7 % — AB (ref 14.0–49.7)
MCH: 30.5 pg (ref 25.1–34.0)
MCHC: 33.7 g/dL (ref 31.5–36.0)
MCV: 90.3 fL (ref 79.5–101.0)
MONO#: 0.5 10*3/uL (ref 0.1–0.9)
MONO%: 10.8 % (ref 0.0–14.0)
NEUT%: 68.9 % (ref 38.4–76.8)
NEUTROS ABS: 3.3 10*3/uL (ref 1.5–6.5)
PLATELETS: 14 10*3/uL — AB (ref 145–400)
RBC: 4.91 10*6/uL (ref 3.70–5.45)
RDW: 15.8 % — ABNORMAL HIGH (ref 11.2–14.5)
WBC: 4.8 10*3/uL (ref 3.9–10.3)
lymph#: 0.5 10*3/uL — ABNORMAL LOW (ref 0.9–3.3)

## 2016-12-17 NOTE — Telephone Encounter (Signed)
Message left on identified vm for pt to come on Fri @ 0930 for IVIG.  Discussed thrombocytopenic precautions also.

## 2016-12-17 NOTE — Telephone Encounter (Signed)
Per 5/22 schedule message ok  Per infusion supervisor to add IVIG 6/1. Added infusion for 6/1 and also schedule other appointments per 5/21 los. Left message for patient and will also give schedule once patient arrives in office today for xrt.

## 2016-12-18 ENCOUNTER — Ambulatory Visit
Admission: RE | Admit: 2016-12-18 | Discharge: 2016-12-18 | Disposition: A | Discharge status: Home / Self Care | Payer: Medicare Other | Source: Ambulatory Visit | Attending: Radiation Oncology | Admitting: Radiation Oncology

## 2016-12-18 DIAGNOSIS — Z510 Encounter for antineoplastic radiation therapy: Secondary | ICD-10-CM | POA: Diagnosis not present

## 2016-12-18 DIAGNOSIS — C19 Malignant neoplasm of rectosigmoid junction: Secondary | ICD-10-CM | POA: Diagnosis not present

## 2016-12-18 DIAGNOSIS — Z853 Personal history of malignant neoplasm of breast: Secondary | ICD-10-CM | POA: Diagnosis not present

## 2016-12-18 DIAGNOSIS — C20 Malignant neoplasm of rectum: Secondary | ICD-10-CM | POA: Diagnosis not present

## 2016-12-18 DIAGNOSIS — Z8669 Personal history of other diseases of the nervous system and sense organs: Secondary | ICD-10-CM | POA: Diagnosis not present

## 2016-12-19 ENCOUNTER — Ambulatory Visit
Admission: RE | Admit: 2016-12-19 | Discharge: 2016-12-19 | Disposition: A | Discharge status: Home / Self Care | Payer: Medicare Other | Source: Ambulatory Visit | Attending: Radiation Oncology | Admitting: Radiation Oncology

## 2016-12-19 ENCOUNTER — Ambulatory Visit (HOSPITAL_BASED_OUTPATIENT_CLINIC_OR_DEPARTMENT_OTHER): Payer: Medicare Other

## 2016-12-19 ENCOUNTER — Other Ambulatory Visit (HOSPITAL_BASED_OUTPATIENT_CLINIC_OR_DEPARTMENT_OTHER): Payer: Medicare Other

## 2016-12-19 VITALS — BP 103/69 | HR 61 | Temp 97.7°F | Resp 16

## 2016-12-19 DIAGNOSIS — C20 Malignant neoplasm of rectum: Secondary | ICD-10-CM | POA: Diagnosis not present

## 2016-12-19 DIAGNOSIS — D693 Immune thrombocytopenic purpura: Secondary | ICD-10-CM

## 2016-12-19 DIAGNOSIS — Z853 Personal history of malignant neoplasm of breast: Secondary | ICD-10-CM | POA: Diagnosis not present

## 2016-12-19 DIAGNOSIS — Z510 Encounter for antineoplastic radiation therapy: Secondary | ICD-10-CM | POA: Diagnosis not present

## 2016-12-19 DIAGNOSIS — Z8669 Personal history of other diseases of the nervous system and sense organs: Secondary | ICD-10-CM | POA: Diagnosis not present

## 2016-12-19 DIAGNOSIS — Z862 Personal history of diseases of the blood and blood-forming organs and certain disorders involving the immune mechanism: Secondary | ICD-10-CM

## 2016-12-19 DIAGNOSIS — C19 Malignant neoplasm of rectosigmoid junction: Secondary | ICD-10-CM | POA: Diagnosis not present

## 2016-12-19 LAB — CBC WITH DIFFERENTIAL/PLATELET
BASO%: 1.2 % (ref 0.0–2.0)
BASOS ABS: 0 10*3/uL (ref 0.0–0.1)
EOS ABS: 0.4 10*3/uL (ref 0.0–0.5)
EOS%: 9.7 % — ABNORMAL HIGH (ref 0.0–7.0)
HEMATOCRIT: 43.1 % (ref 34.8–46.6)
HEMOGLOBIN: 14.4 g/dL (ref 11.6–15.9)
LYMPH%: 9.2 % — ABNORMAL LOW (ref 14.0–49.7)
MCH: 30.2 pg (ref 25.1–34.0)
MCHC: 33.3 g/dL (ref 31.5–36.0)
MCV: 90.6 fL (ref 79.5–101.0)
MONO#: 0.5 10*3/uL (ref 0.1–0.9)
MONO%: 12.1 % (ref 0.0–14.0)
NEUT%: 67.8 % (ref 38.4–76.8)
NEUTROS ABS: 2.5 10*3/uL (ref 1.5–6.5)
Platelets: 11 10*3/uL — ABNORMAL LOW (ref 145–400)
RBC: 4.76 10*6/uL (ref 3.70–5.45)
RDW: 16.5 % — AB (ref 11.2–14.5)
WBC: 3.8 10*3/uL — AB (ref 3.9–10.3)
lymph#: 0.3 10*3/uL — ABNORMAL LOW (ref 0.9–3.3)

## 2016-12-19 MED ORDER — DEXTROSE 5 % IV SOLN
Freq: Once | INTRAVENOUS | Status: AC
  Administered 2016-12-19: 09:00:00 via INTRAVENOUS

## 2016-12-19 MED ORDER — DIPHENHYDRAMINE HCL 25 MG PO CAPS
ORAL_CAPSULE | ORAL | Status: AC
  Filled 2016-12-19: qty 1

## 2016-12-19 MED ORDER — ACETAMINOPHEN 325 MG PO TABS
650.0000 mg | ORAL_TABLET | Freq: Once | ORAL | Status: AC
  Administered 2016-12-19: 650 mg via ORAL

## 2016-12-19 MED ORDER — SODIUM CHLORIDE 0.9 % IV SOLN: Freq: Once | INTRAVENOUS | Status: DC

## 2016-12-19 MED ORDER — DIPHENHYDRAMINE HCL 25 MG PO TABS
25.0000 mg | ORAL_TABLET | Freq: Once | ORAL | Status: AC
  Administered 2016-12-19: 25 mg via ORAL
  Filled 2016-12-19: qty 1

## 2016-12-19 MED ORDER — IMMUNE GLOBULIN (HUMAN) 10 GM/100ML IV SOLN
60.0000 g | Freq: Once | INTRAVENOUS | Status: AC
  Administered 2016-12-19: 60 g via INTRAVENOUS
  Filled 2016-12-19: qty 600

## 2016-12-19 MED ORDER — ACETAMINOPHEN 325 MG PO TABS
ORAL_TABLET | ORAL | Status: AC
  Filled 2016-12-19: qty 2

## 2016-12-19 NOTE — Progress Notes (Signed)
[place]  [phone] [phone]  Clinic Follow Up Note   Patient Care Team: Patient, No Pcp Per as PCP - General (General Practice) Dunlap, Allison Lines, MD as Consulting Physician (Gastroenterology) Allison Ruff, MD as Consulting Physician (General Surgery) Allison Rudd, MD as Consulting Physician (Radiation Oncology) Allison Merle, MD as Consulting Physician (Hematology) 12/23/2016   CHIEF COMPLAINTS/PURPOSE OF CONSULTATION:  Follow up of diagnosed rectosigmoid cancer   Oncology History   Cancer Staging Rectal cancer [facility]) Staging form: Colon and Rectum, AJCC 8th Edition - Clinical stage from 10/14/2016: Stage IIIB (cT3, cN1, cM0) - Signed by Allison Merle, MD on 10/24/2016       Rectal cancer ([place])   10/14/2016 Initial Diagnosis    Rectal cancer ([place])      10/14/2016 Procedure    Colonoscopy by Dr. Hilarie Dunlap showed a fungating partially upchucking large mass in the rectosigmoid colon, the mass begins about 10 cm from the dentate line and measured 5 cm in length. Biopsy was obtained. Multiple small and largemouth diverticula were found in the sigmoid colon and the ascending colon.      10/14/2016 Initial Biopsy    Rectosigmoid mass biopsy showed adenocarcinoma       10/15/2016 Imaging    CT of chest, abdomen and pelvis with contrast showed a 4 cm annular colonic soft tissue mass near the rectosigmoid junction, consistent with known primary carcinoma. Several small her chronic lymph nodes measuring up to 23m, suspicious for local lymph node metastasis. No evidence of distant metastasis.      11/17/2016 -  Radiation Therapy     Radiation Therapy by Dr. MLisbeth Dunlap     11/17/2016 -  Chemotherapy    Concurrent chemo Xeloda (15069min am and 100051mn evening) to go along with radiation.   Hold chemo due to platelet count 12/08/16       HISTORY OF PRESENTING ILLNESS:  Allison Dunlap 66o. female is here because of Her recently diagnosed rectosigmoid  colon cancer. She was referred by her gastroenterologist Allison Dunlap presents to my clinic by herself today.  She has been having constipationa and diarrhea for 6 months, and daily hematochezia, a tea spoon each time, no rectal pain, no bloating, nause or other symptoms, no change of appetie and weight. She does not have a primary care physician, and [facility] She was seen by Allison Dunlap underwent colonoscopy, which showed a large mass in the rectosigmoid junction. Biopsy showed adenocarcinoma.  She was diagnosed with ulcerative colitis in her 20's, resolved and now.  She has had thrombocytopenia since 2013, she has bone marrow biopsy, which was normal per pt, never required any treatment    She is widowed, no children, [place]   CURRENT THERAPY: concurrent chemo Xeloda (1500m37m am and 1000mg61mevening) and radiation, starting on 11/17/2016, will hold Xeloda due to thrombocytopenia on 12/08/16  INTERVAL HISTORY: Allison Amaliya Whitelawrns today for follow up. She has been feeling fine since her previous infusion. She denies any trouble from radiation. She has been having dryness in her rectum area. She has had a rash near her rectum that has worsened since last week. She has been taking imodium occasionally, which makes her feel bloated.    MEDICAL HISTORY:  Past Medical History:  Diagnosis Date  . Cancer (HCC) [place]67/2018   rectal cancer-adenocarcinoma  . Clotting disorder (HCC) [place] Temporary low platelet  count ([place])     SURGICAL HISTORY: Past Surgical History:  Procedure Laterality Date  . NO PAST SURGERIES      SOCIAL HISTORY: Social History   Social History  . Marital status: Widowed    Spouse name: N/A  . Number of children: 0  . Years of education: N/A   Occupational History  . retired    Social History Main Topics  . Smoking status: Former Smoker    Packs/day: 0.50    Years: 30.00     Quit date: 07/28/1997  . Smokeless tobacco: Never Used  . Alcohol use 4.2 oz/week    7 Glasses of wine per week     Comment: wine and beer  . Drug use: No  . Sexual activity: Not on file   Other Topics Concern  . Not on file   Social History Narrative  . No narrative on file    FAMILY HISTORY: Family History  Problem Relation Age of Onset  . Alzheimer's disease Mother   . Liver disease Father 8       failure  . Breast cancer Sister   . Cancer Sister 69       breast cancer   . Heart disease Maternal Grandfather   . Cancer Paternal Grandmother        type unknown    ALLERGIES:  has No Known Allergies.  MEDICATIONS:  Current Outpatient Prescriptions  Medication Sig Dispense Refill  . capecitabine (XELODA) 500 MG tablet Take 3 tab in the morning and 2 tab in the evening, on the days of radiation, Monday through Friday (Patient not taking: Reported on 12/15/2016) 150 tablet 0  . Cholecalciferol (VITAMIN D PO) Take 1 tablet by mouth daily.    . Cyanocobalamin (VITAMIN B-12 PO) Take 1 tablet by mouth daily.    . Multiple Vitamins-Minerals (OCUVITE PO) Take 1 tablet by mouth daily.     No current facility-administered medications for this visit.     REVIEW OF SYSTEMS:   Constitutional: Denies fevers, chills or abnormal night sweats Eyes: Denies blurriness of vision, double vision or watery eyes Ears, nose, mouth, throat, and face: Denies mucositis or sore throat Respiratory: Denies cough, dyspnea or wheezes Cardiovascular: Denies palpitation, chest discomfort or lower extremity swelling Gastrointestinal:  Denies nausea, heartburn  Skin:(+)rash in rectum area Lymphatics: Denies new lymphadenopathy or easy bruising Neurological:Denies numbness, tingling or new weaknesses Behavioral/Psych: Mood is stable, no new changes  All other systems were reviewed with the patient and are negative.  PHYSICAL EXAMINATION:  ECOG PERFORMANCE STATUS: 1  Vitals:   12/23/16 1018  BP:  136/82  Pulse: 64  Resp: 20  Temp: 98.4 F (36.9 C)   Filed Weights   12/23/16 1018  Weight: 119 lb 6.4 oz (54.2 kg)    GENERAL:alert, no distress and comfortable SKIN: skin color, texture, turgor are normal,  or significant lesions (+) skin redness in perianal area, a small ulcer in between buttocks  EYES: normal, conjunctiva are pink and non-injected, sclera clear OROPHARYNX:no exudate, no erythema and lips, buccal mucosa, and tongue normal  NECK: supple, thyroid normal size, non-tender, without nodularity LYMPH:  no palpable lymphadenopathy in the cervical, axillary or inguinal LUNGS: clear to auscultation and percussion with normal breathing effort HEART: regular rate & rhythm and no murmurs and no lower extremity edema ABDOMEN:abdomen soft, non-tender and normal bowel sounds Musculoskeletal:no cyanosis of digits and no clubbing  PSYCH: alert & oriented x 3 with fluent speech NEURO: no focal motor/sensory deficits  LABORATORY DATA:  I have reviewed the data as listed CBC Latest Ref Rng & Units 12/23/2016 12/19/2016 12/17/2016  WBC 3.9 - 10.3 10e3/uL 3.5(L) 3.8(L) 4.8  Hemoglobin 11.6 - 15.9 g/dL 13.0 14.4 15.0  Hematocrit 34.8 - 46.6 % 38.6 43.1 44.3  Platelets 145 - 400 10e3/uL 82(L) 11(L) 14(L)   CMP Latest Ref Rng & Units 12/15/2016 12/11/2016 12/08/2016  Glucose 70 - 140 mg/dl 101 105 87  BUN 7.0 - 26.0 mg/dL 8.9 12.7 8.7  Creatinine 0.6 - 1.1 mg/dL 0.8 0.8 0.7  Sodium 136 - 145 mEq/L 141 139 140  Potassium 3.5 - 5.1 mEq/L 4.2 3.8 3.7  Chloride 96 - 112 mEq/L - - -  CO2 22 - 29 mEq/L _0 Calcium 8.4 - 10.4 mg/dL 9.2 9.8 9.3  Total Protein 6.4 - 8.3 g/dL 6.3(L) 6.7 6.8  Total Bilirubin 0.20 - 1.20 mg/dL 0.97 0.62 0.73  Alkaline Phos 40 - 150 U/L 92 82 87  AST 5 - 34 U/L _1 ALT 0 - 55 U/L _2 PATHOLOGY REPORT  Diagnosis 10/14/2016 Colon, biopsy, rectosigmoid tumor - ADENOCARCINOMA.  RADIOGRAPHIC STUDIES: I have personally reviewed the  radiological images as listed and agreed with the findings in the report. No results found.   CT CAP W Contrast: 10/15/16 IMPRESSION: 4 cm annular colonic soft tissue mass near the rectosigmoid junction, consistent with known primary carcinoma. Several small pericolonic lymph nodes measuring up to 10 mm, suspicious for local lymph node metastases. No evidence of distant metastatic disease. Proximal sigmoid diverticulosis.  No evidence of diverticulitis.  Colonoscopy 10/14/2016 - The perianal exam findings include a perianal fungal rash. - The digital rectal exam was normal. - The terminal ileum appeared normal. - A fungating partially obstructing large mass was found in the recto-sigmoid colon. The mass was circumferential. The mass begins about 10 cm from the dentate line and measured five cm in length. Oozing was present. Multiple biopsies were obtained with cold forceps for histology in a targeted manner. Area proximal to the tumor was tattooed on opposite walls with an injection of 3 mL total of Spot (carbon black). - Multiple small and large-mouthed diverticula were found in the sigmoid colon and descending colon. - The retroflexed view of the distal rectum and anal verge was normal and showed no anal or rectal abnormalities.  ASSESSMENT & PLAN: 66 y.o.  Caucasian female, with past medical history of ulcerative colitis, some cytopenia, presented with diarrhea, constipation, and persistent hematochezia. Months.  1. Rectosigmoid adenocarcinoma, cT3N1M0, stage IIIB, MSI-pending  -I previously reviewed his CT scan findings, colonoscopy and biopsy pathology results in great details with patient. -The tumor is located in the proximal rectum and rectosigmoid junction. -Prior CT scan showed several perirectal lymph nodes, suspicious for node metastasis. She likely has locally advanced disease. -I previously presented her case in our GI tumor Board. Due to the location of the cancer, Dr.  Marcello Moores recommends neoadjuvant chemotherapy and radiation, followed by surgical resection. -Tumor Board feels her disease is likely T3 N1 based on CT scan findings, EUS or MRI for staging is probably not needed. -I previously recommend neoadjuvant concurrent chemotherapy with Xeloda or 5-FU. Patient opted Xeloda. - Patient has start concurrent chemo and radiation therapy on 11/17/16, tolerating well so far, chemo has been held since 5/14 due to severe thrombocytopenia -Her thrombocytopenia has significantly improved since IVIG last week. She only has 3 more days of radiation this week, we'll continue  to hold Xeloda. -She is scheduled to see her surgeon Dr. Marcello Moores. We discussed the timing of her surgery, and the role of adjuvant chemotherapy. Given her advanced stage, positive nodes, she likely would need adjuvant FOLFOX. However her thrombocytopenia may be a significant concern for chemotherapy. 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 This is most consistent with ITP.  -She has not required any treatment. Her platelet counts are 76 on 11/17/16. This is increased from 49 in March 2018. -We previously discussed chemotherapy may cause thrombocytopenia. If needed, I would consider steroids treatment for her ITP during chemo.  -Due to low platelet count of 19K (12/08/16) we stopped Xeloda. while we monitor platelet.  -She has received dexamethasone, and IVIG for her ITP. Her platelet count has significant improved today. -We discussed as options for ITP, I recommend her to consider splenectomy. As options, such as Rituxan were discussed with her also. -We discussed that she needs adjuvant chemotherapy for her rectal cancer, however thrombocytopenia would be  a big concern for her chemo  -She will think about splenectomy, and discussed with Dr. Marcello Moores.  3. History of ulcerative colitis -follow up with Dr. Hilarie Dunlap  -We previously discussed her that her colitis may exacerbate during the radiation and chemotherapy. -She may use Imodium as needed for diarrhea, she'll follow-up with Dr. Hilarie Dunlap if needed.  Plan -lab reviewed, will continue to hold Xeloda. She will complete RT this week. - message dr. Marcello Moores about splenectomy  - f/u with lab in 4-5 weeks  - next visit after surgery   No orders of the defined types were placed in this encounter.   All questions were answered. The patient knows to call the clinic with any problems, questions or concerns.  I spent 25 minutes counseling the patient face to face. The total time spent in the appointment was 30 minutes and more than 50% was on counseling.  This document serves as a record of services personally performed by Allison Merle, MD. It was created on her behalf by Brandt Loosen, a trained medical scribe. The creation of this record is based on the scribe's personal observations and the provider's statements to them. This document has been checked and approved by the attending provider.     Allison Merle, MD 12/23/2016

## 2016-12-19 NOTE — Patient Instructions (Signed)

## 2016-12-19 NOTE — Progress Notes (Signed)
Patient tolerated treatment well and monitored for 30 minutes post transfusion. Patient and vital signs stable upon discharge.

## 2016-12-23 ENCOUNTER — Other Ambulatory Visit: Payer: Self-pay | Admitting: Radiation Oncology

## 2016-12-23 ENCOUNTER — Ambulatory Visit
Admission: RE | Admit: 2016-12-23 | Discharge: 2016-12-23 | Disposition: A | Discharge status: Home / Self Care | Payer: Medicare Other | Source: Ambulatory Visit | Attending: Radiation Oncology | Admitting: Radiation Oncology

## 2016-12-23 ENCOUNTER — Ambulatory Visit (HOSPITAL_BASED_OUTPATIENT_CLINIC_OR_DEPARTMENT_OTHER): Payer: Medicare Other | Admitting: Hematology

## 2016-12-23 ENCOUNTER — Other Ambulatory Visit (HOSPITAL_BASED_OUTPATIENT_CLINIC_OR_DEPARTMENT_OTHER): Payer: Medicare Other

## 2016-12-23 ENCOUNTER — Telehealth: Payer: Self-pay | Admitting: Hematology

## 2016-12-23 VITALS — BP 136/82 | HR 64 | Temp 98.4°F | Resp 20 | Ht 59.0 in | Wt 119.4 lb

## 2016-12-23 DIAGNOSIS — Z853 Personal history of malignant neoplasm of breast: Secondary | ICD-10-CM | POA: Diagnosis not present

## 2016-12-23 DIAGNOSIS — C19 Malignant neoplasm of rectosigmoid junction: Secondary | ICD-10-CM

## 2016-12-23 DIAGNOSIS — K5190 Ulcerative colitis, unspecified, without complications: Secondary | ICD-10-CM

## 2016-12-23 DIAGNOSIS — D693 Immune thrombocytopenic purpura: Secondary | ICD-10-CM

## 2016-12-23 DIAGNOSIS — R599 Enlarged lymph nodes, unspecified: Secondary | ICD-10-CM

## 2016-12-23 DIAGNOSIS — C20 Malignant neoplasm of rectum: Secondary | ICD-10-CM | POA: Diagnosis not present

## 2016-12-23 DIAGNOSIS — D6959 Other secondary thrombocytopenia: Secondary | ICD-10-CM | POA: Diagnosis not present

## 2016-12-23 DIAGNOSIS — Z510 Encounter for antineoplastic radiation therapy: Secondary | ICD-10-CM | POA: Diagnosis not present

## 2016-12-23 DIAGNOSIS — Z8669 Personal history of other diseases of the nervous system and sense organs: Secondary | ICD-10-CM | POA: Diagnosis not present

## 2016-12-23 LAB — CBC WITH DIFFERENTIAL/PLATELET
BASO%: 1.8 % (ref 0.0–2.0)
BASOS ABS: 0.1 10*3/uL (ref 0.0–0.1)
EOS%: 5.2 % (ref 0.0–7.0)
Eosinophils Absolute: 0.2 10*3/uL (ref 0.0–0.5)
HCT: 38.6 % (ref 34.8–46.6)
HGB: 13 g/dL (ref 11.6–15.9)
LYMPH%: 9.9 % — AB (ref 14.0–49.7)
MCH: 30.4 pg (ref 25.1–34.0)
MCHC: 33.7 g/dL (ref 31.5–36.0)
MCV: 90.2 fL (ref 79.5–101.0)
MONO#: 0.3 10*3/uL (ref 0.1–0.9)
MONO%: 9.6 % (ref 0.0–14.0)
NEUT#: 2.6 10*3/uL (ref 1.5–6.5)
NEUT%: 73.5 % (ref 38.4–76.8)
Platelets: 82 10*3/uL — ABNORMAL LOW (ref 145–400)
RBC: 4.28 10*6/uL (ref 3.70–5.45)
RDW: 16.2 % — ABNORMAL HIGH (ref 11.2–14.5)
WBC: 3.5 10*3/uL — ABNORMAL LOW (ref 3.9–10.3)
lymph#: 0.3 10*3/uL — ABNORMAL LOW (ref 0.9–3.3)

## 2016-12-23 LAB — COMPREHENSIVE METABOLIC PANEL
ALT: 21 U/L (ref 0–55)
ANION GAP: 6 meq/L (ref 3–11)
AST: 21 U/L (ref 5–34)
Albumin: 3.2 g/dL — ABNORMAL LOW (ref 3.5–5.0)
Alkaline Phosphatase: 81 U/L (ref 40–150)
BUN: 12.5 mg/dL (ref 7.0–26.0)
CALCIUM: 9.2 mg/dL (ref 8.4–10.4)
CHLORIDE: 108 meq/L (ref 98–109)
CO2: 27 meq/L (ref 22–29)
CREATININE: 0.7 mg/dL (ref 0.6–1.1)
EGFR: 84 mL/min/{1.73_m2} — ABNORMAL LOW (ref >90)
Glucose: 88 mg/dl (ref 70–140)
POTASSIUM: 4 meq/L (ref 3.5–5.1)
Sodium: 141 mEq/L (ref 136–145)
Total Bilirubin: 0.44 mg/dL (ref 0.20–1.20)
Total Protein: 7.2 g/dL (ref 6.4–8.3)

## 2016-12-23 NOTE — Telephone Encounter (Signed)
Gave patient AVS and calender per 5/29 LOS. Lab and f/u in 4-5 weeks.

## 2016-12-24 ENCOUNTER — Encounter: Payer: Self-pay | Admitting: Hematology

## 2016-12-24 ENCOUNTER — Ambulatory Visit
Admission: RE | Admit: 2016-12-24 | Discharge: 2016-12-24 | Disposition: A | Discharge status: Home / Self Care | Payer: Medicare Other | Source: Ambulatory Visit | Attending: Radiation Oncology | Admitting: Radiation Oncology

## 2016-12-24 DIAGNOSIS — Z853 Personal history of malignant neoplasm of breast: Secondary | ICD-10-CM | POA: Diagnosis not present

## 2016-12-24 DIAGNOSIS — C20 Malignant neoplasm of rectum: Secondary | ICD-10-CM | POA: Diagnosis not present

## 2016-12-24 DIAGNOSIS — Z510 Encounter for antineoplastic radiation therapy: Secondary | ICD-10-CM | POA: Diagnosis not present

## 2016-12-24 DIAGNOSIS — Z8669 Personal history of other diseases of the nervous system and sense organs: Secondary | ICD-10-CM | POA: Diagnosis not present

## 2016-12-24 DIAGNOSIS — C19 Malignant neoplasm of rectosigmoid junction: Secondary | ICD-10-CM | POA: Diagnosis not present

## 2016-12-25 ENCOUNTER — Encounter: Payer: Self-pay | Admitting: Radiation Oncology

## 2016-12-25 ENCOUNTER — Ambulatory Visit
Admission: RE | Admit: 2016-12-25 | Discharge: 2016-12-25 | Disposition: A | Discharge status: Home / Self Care | Payer: Medicare Other | Source: Ambulatory Visit | Attending: Radiation Oncology | Admitting: Radiation Oncology

## 2016-12-25 DIAGNOSIS — C19 Malignant neoplasm of rectosigmoid junction: Secondary | ICD-10-CM | POA: Diagnosis not present

## 2016-12-25 DIAGNOSIS — C20 Malignant neoplasm of rectum: Secondary | ICD-10-CM | POA: Diagnosis not present

## 2016-12-25 DIAGNOSIS — Z853 Personal history of malignant neoplasm of breast: Secondary | ICD-10-CM | POA: Diagnosis not present

## 2016-12-25 DIAGNOSIS — Z510 Encounter for antineoplastic radiation therapy: Secondary | ICD-10-CM | POA: Diagnosis not present

## 2016-12-25 DIAGNOSIS — Z8669 Personal history of other diseases of the nervous system and sense organs: Secondary | ICD-10-CM | POA: Diagnosis not present

## 2016-12-26 ENCOUNTER — Other Ambulatory Visit: Payer: Self-pay | Admitting: General Surgery

## 2016-12-26 ENCOUNTER — Ambulatory Visit: Payer: Medicare Other

## 2016-12-26 DIAGNOSIS — C20 Malignant neoplasm of rectum: Secondary | ICD-10-CM | POA: Diagnosis not present

## 2016-12-26 NOTE — H&P (Signed)
History of Present Illness Allison Ruff MD; 08/02/1094 3:24 PM) The patient is a 66 year old female who presents with colorectal cancer. 66 year old female with recent change in bowel habits as well as some rectal bleeding who underwent a colonoscopy by Dr. Hilarie Fredrickson. This showed a proximal rectal mass from approximately 10-15 cm from anal verge. Biopsies show invasive adenocarcinoma. CT scan show no signs of metastatic disease but enlarged perirectal lymph nodes. She has completed radiation but did have trouble with bleeding the chemotherapy portion of her treatment due to thrombocytopenia. She has tolerated radiation rather well with minimal symptoms. We were asked to evaluate for possible splenectomy as well to help her platelets come up to a level that she can receive adjuvant chemotherapy.   Problem List/Past Medical Allison Ruff, MD; 0/10/5407 3:24 PM) RECTAL CANCER (C[facility])   Past Surgical History Allison Ruff, MD; 02/25/1913 3:24 PM) No pertinent past surgical history   Diagnostic Studies History Allison Ruff, MD; 02/02/2955 3:24 PM) Colonoscopy  within last year Mammogram  1-3 years ago Pap Smear  1-5 years ago  Allergies Jerrye Bushy, [place]; 12/26/2016 2:26 PM) No Known Drug Allergies 11/11/2016 Allergies Reconciled   Medication History Allison Ruff, MD; 08/28/3084 3:24 PM) Vitamin D (1000UNIT Tablet, Oral) Active. Cyanocobalamin (1000MCG Tablet, Oral) Active. Multivitamin Adult (Oral) Active. Medications Reconciled Neomycin Sulfate (500MG Tablet, 2 (two) Tablet Oral SEE NOTE, Taken starting 12/26/2016) Active. (TAKE TWO TABLETS AT 2 PM, 3 PM, AND 10 PM THE DAY PRIOR TO SURGERY) Flagyl (500MG Tablet, 2 (two) Tablet Oral SEE NOTE, Taken starting 12/26/2016) Active. (Take at 2pm, 3pm, and 10pm the day prior to your colon operation)  Social History Allison Ruff, MD; 12/02/8467 3:24 PM) Alcohol use  Moderate alcohol use. Caffeine use  Coffee. Illicit drug use   Remotely quit drug use. Tobacco use  Former smoker.  Family History Allison Ruff, MD; 12/28/9526 3:24 PM) Hypertension  Sister.  Pregnancy / Birth History Allison Ruff, MD; [birth year] 3:24 PM) Age at menarche  2 years. Age of menopause  71-55 Gravida  0 Irregular periods  Para  0  Other Problems Allison Ruff, MD; 0/07/270 3:24 PM) Ulcerative Colitis     Review of Systems Allison Ruff MD; 11/28/6642 3:24 PM) General Not Present- Appetite Loss, Chills, Fatigue, Fever, Night Sweats, Weight Gain and Weight Loss. Skin Not Present- Change in Wart/Mole, Dryness, Hives, Jaundice, New Lesions, Non-Healing Wounds, Rash and Ulcer. HEENT Present- Wears glasses/contact lenses. Not Present- Earache, Hearing Loss, Hoarseness, Nose Bleed, Oral Ulcers, Ringing in the Ears, Seasonal Allergies, Sinus Pain, Sore Throat, Visual Disturbances and Yellow Eyes. Respiratory Not Present- Bloody sputum, Chronic Cough, Difficulty Breathing, Snoring and Wheezing. Cardiovascular Not Present- Chest Pain, Difficulty Breathing Lying Down, Leg Cramps, Palpitations, Rapid Heart Rate, Shortness of Breath and Swelling of Extremities. Gastrointestinal Present- Bloody Stool and Change in Bowel Habits. Not Present- Abdominal Pain, Bloating, Chronic diarrhea, Constipation, Difficulty Swallowing, Excessive gas, Gets full quickly at meals, Hemorrhoids, Indigestion, Nausea, Rectal Pain and Vomiting. Female Genitourinary Not Present- Frequency, Nocturia, Painful Urination, Pelvic Pain and Urgency. Musculoskeletal Not Present- Back Pain, Joint Pain, Joint Stiffness, Muscle Pain, Muscle Weakness and Swelling of Extremities. Neurological Not Present- Decreased Memory, Fainting, Headaches, Numbness, Seizures, Tingling, Tremor, Trouble walking and Weakness. Psychiatric Not Present- Anxiety, Bipolar, Change in Sleep Pattern, Depression, Fearful and Frequent crying. Endocrine Present- Hair Changes. Not Present- Cold Intolerance,  Excessive Hunger, Heat Intolerance, Hot flashes and New Diabetes. Hematology Not Present- Blood Thinners, Easy Bruising, Excessive bleeding, Gland problems, HIV  and Persistent Infections.  Vitals [employer] RMA; 12/26/2016 2:26 PM) 12/26/2016 2:26 PM Weight: 118.2 lb Height: 60in Body Surface Area: 1.49 m Body Mass Index: 23.08 kg/m  Temp.: 98.85F  Pulse: 74 (Regular)  P.OX: 98% (Room air) BP: 130/80 (Sitting, Left Arm, Standard)       Physical Exam Allison Ruff MD; 02/25/5946 3:15 PM) General Mental Status-Alert. General Appearance-Not in acute distress. Build & Nutrition-Well nourished. Posture-Normal posture. Gait-Normal.  Head and Neck Head-normocephalic, atraumatic with no lesions or palpable masses. Trachea-midline.  Chest and Lung Exam Chest and lung exam reveals -on auscultation, normal breath sounds, no adventitious sounds and normal vocal resonance.  Cardiovascular Cardiovascular examination reveals -normal heart sounds, regular rate and rhythm with no murmurs and no digital clubbing, cyanosis, edema, increased warmth or tenderness.  Abdomen Inspection Inspection of the abdomen reveals - No Hernias. Palpation/Percussion Palpation and Percussion of the abdomen reveal - Soft, Non Tender, No Rigidity (guarding), No hepatosplenomegaly and No Palpable abdominal masses.  Rectal Note: Palpable mass at least 8 cm   Neurologic Neurologic evaluation reveals -alert and oriented x 3 with no impairment of recent or remote memory, normal attention span and ability to concentrate, normal sensation and normal coordination.  Musculoskeletal Normal Exam - Bilateral-Upper Extremity Strength Normal and Lower Extremity Strength Normal.    Assessment & Plan Allison Ruff MD; 0/01/6150 3:26 PM) RECTAL CANCER (C[facility]) Impression: 66 year old female with advanced stage mid rectal cancer status post radiation and chemotherapy. We will plan on doing  her low anterior resection in late July. We discussed the possibility of needing a diverting ostomy but I think it is likely that we will be able to avoid this and save the distal portion of her rectum. Her oncologist is asked [place] to discuss splenectomy with her. I will have her see my partner Dr. Johney Maine to determine if this is something she would like to pursue to assist with getting adjuvant chemotherapy postoperatively. The surgery and anatomy were described to the patient as well as the risks of surgery and the possible complications. These include: Bleeding, deep abdominal infections and possible wound complications such as hernia and infection, damage to adjacent structures, leak of surgical connections, which can lead to other surgeries and possibly an ostomy, possible need for other procedures, such as abscess drains in radiology, possible prolonged hospital stay, possible diarrhea from removal of part of the colon, possible constipation from narcotics, possible bowel, bladder or sexual dysfunction if having rectal surgery, prolonged fatigue/weakness or appetite loss, possible early recurrence of of disease, possible complications of their medical problems such as heart disease or arrhythmias or lung problems, death (less than 1%). I believe the patient understands and wishes to proceed with the surgery.

## 2016-12-29 ENCOUNTER — Ambulatory Visit: Payer: Medicare Other

## 2016-12-30 ENCOUNTER — Ambulatory Visit: Admission: RE | Admit: 2016-12-30 | Payer: Medicare Other | Source: Ambulatory Visit

## 2016-12-30 NOTE — Progress Notes (Signed)
Radiation Oncology         [phone] ________________________________  Name: Allison Dunlap MRN: [id number]  Date: 12/25/2016  DOB: [birth year]  End of Treatment Note  Diagnosis:   Rectosigmoid adenocarcinoma, cT3N1M0, stage IIIB, MSI-pending  Indication for treatment:  Curative, pre-operative with concurrent chemotherapy   Radiation treatment dates:  11/17/16 - 12/25/16  Site/dose:    1) Rectum: 45 Gy in 15 fractions 2) Rectal Boost: 5.4 Gy in 3 fractions  Beams/energy:   3D // 15X, 6X Photon  Narrative: The patient tolerated radiation treatment relatively well. She reported rectal irriation, some rectal bleeding while wiping, diarrhea for which she took Immodium, a decrease in appetite, and thrombocytopenia for which she received dexamethasone and IVIG.  Plan: The patient has completed radiation treatment. The patient will return to radiation oncology clinic for routine followup in one month. I advised them to call or return sooner if they have any questions or concerns related to their recovery or treatment. ------------------------------------------------  Jodelle Gross, MD, PhD  This document serves as a record of services personally performed by Kyung Rudd, MD. It was created on his behalf by Darcus Austin, a trained medical scribe. The creation of this record is based on the scribe's personal observations and the provider's statements to them. This document has been checked and approved by the attending provider.

## 2016-12-31 ENCOUNTER — Ambulatory Visit: Payer: Medicare Other

## 2017-01-01 ENCOUNTER — Ambulatory Visit: Payer: Medicare Other

## 2017-01-02 ENCOUNTER — Telehealth: Payer: Self-pay | Admitting: *Deleted

## 2017-01-02 NOTE — Telephone Encounter (Signed)
CALLED PATIENT TO INFORM OF FU ON 01-12-17 @ 1:30 PM, SPOKE WITH PATIENT AND SHE IS AWARE OF THIS APPT.

## 2017-01-12 ENCOUNTER — Ambulatory Visit
Admission: RE | Admit: 2017-01-12 | Discharge: 2017-01-12 | Disposition: A | Discharge status: Home / Self Care | Payer: Medicare Other | Source: Ambulatory Visit | Attending: Radiation Oncology | Admitting: Radiation Oncology

## 2017-01-12 ENCOUNTER — Encounter: Payer: Self-pay | Admitting: Radiation Oncology

## 2017-01-12 VITALS — BP 133/95 | HR 85 | Temp 98.2°F | Resp 16 | Ht 59.0 in | Wt 116.8 lb

## 2017-01-12 DIAGNOSIS — Z853 Personal history of malignant neoplasm of breast: Secondary | ICD-10-CM | POA: Diagnosis not present

## 2017-01-12 DIAGNOSIS — C20 Malignant neoplasm of rectum: Secondary | ICD-10-CM

## 2017-01-12 DIAGNOSIS — Z510 Encounter for antineoplastic radiation therapy: Secondary | ICD-10-CM | POA: Diagnosis not present

## 2017-01-12 DIAGNOSIS — Z8669 Personal history of other diseases of the nervous system and sense organs: Secondary | ICD-10-CM | POA: Diagnosis not present

## 2017-01-12 DIAGNOSIS — C19 Malignant neoplasm of rectosigmoid junction: Secondary | ICD-10-CM | POA: Diagnosis not present

## 2017-01-12 NOTE — Progress Notes (Signed)
  Radiation Oncology         [phone] ________________________________  Name: Allison Dunlap MRN: [id number]  Date: 01/12/2017  DOB: Nov 11, 1950  Post Treatment Note  CC: Patient, No Pcp Per  Truitt Merle, MD  Diagnosis:   Stage IIIB, cT3N1M0 adenocarcinoma of the rectum.    Interval Since Last Radiation: 3 weeks   11/17/16 - 12/25/16 1.  Rectum: 45 Gy in 15 fractions 2.  Rectal Boost: 5.4 Gy in 3 fractions   Narrative:  The patient returns today for routine follow-up. 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 She is planning on meeting back with Dr. Marcello Moores and plans to undergo LAR on 02/18/17.                         On review of systems, the patient states she's doing well and is questioning whether or not she will have her spleen removed. She reports that she's supposed to meet with Dr. Johney Maine as well about this. She reports her stools are more formed in the last two weeks and had a small amount of mucous per stool last week that was pink but no frank blood in her stool or when wiping. She denies any bleeding elsewhere.   ALLERGIES:  has No Known Allergies.  Meds: Current Outpatient Prescriptions  Medication Sig Dispense Refill  . Cholecalciferol (VITAMIN D PO) Take 1 tablet by mouth daily.    . Cyanocobalamin (VITAMIN B-12 PO) Take 1 tablet by mouth daily.    . Multiple Vitamins-Minerals (OCUVITE PO) Take 1 tablet by mouth daily.    . capecitabine (XELODA) 500 MG tablet Take 3 tab in the morning and 2 tab in the evening, on the days of radiation, Monday through Friday (Patient not taking: Reported on 01/12/2017) 150 tablet 0   No current facility-administered medications for this encounter.     Physical Findings:  height is 4' 11"  (1.499 m) and weight is 116 lb 12.8 oz (53 kg). Her oral temperature is 98.2 F (36.8 C). Her blood pressure is  133/95 (abnormal) and her pulse is 85. Her respiration is 16 and oxygen saturation is 100%.  Pain Assessment Pain Score: 0-No pain/10 In general this is a well appearing caucasian female in no acute distress. She's alert and oriented x4 and appropriate throughout the examination. Cardiopulmonary assessment is negative for acute distress and she exhibits normal effort.   Lab Findings: Lab Results  Component Value Date   WBC 3.5 (L) 12/23/2016   HGB 13.0 12/23/2016   HCT 38.6 12/23/2016   MCV 90.2 12/23/2016   PLT 82 (L) 12/23/2016     Radiographic Findings: No results found.  Impression/Plan: 1. Stage IIIB, cT3N1M0 adenocarcinoma of the rectum. The patient appears to be doing well since completing radiotherapy. She will move forward with surgery with Dr. Marcello Moores and as above is also going to meet Dr. 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 We will be happy to see her back as needed moving forward, but will defer surveillance to Dr. Burr Medico. She states agreement and understanding. 2. ITP. See #1, considering splenectomy per Dr. Johney Maine.      Carola Rhine, PAC

## 2017-01-12 NOTE — Addendum Note (Signed)
Encounter addended by: Malena Edman, RN on: 01/12/2017  4:35 PM<BR>    Actions taken: Charge Capture section accepted

## 2017-01-19 DIAGNOSIS — D693 Immune thrombocytopenic purpura: Secondary | ICD-10-CM | POA: Diagnosis not present

## 2017-01-20 ENCOUNTER — Ambulatory Visit: Payer: Self-pay | Admitting: Surgery

## 2017-01-26 DIAGNOSIS — Z23 Encounter for immunization: Secondary | ICD-10-CM | POA: Diagnosis not present

## 2017-01-26 NOTE — Progress Notes (Signed)
[facility]  [phone] [phone]  Clinic Follow Up Note   Patient Care Team: Patient, No Pcp Per as PCP - General (General Practice) Pyrtle, Lajuan Lines, MD as Consulting Physician (Gastroenterology) Leighton Ruff, MD as Consulting Physician (General Surgery) Kyung Rudd, MD as Consulting Physician (Radiation Oncology) Truitt Merle, MD as Consulting Physician (Hematology) 01/27/2017   CHIEF COMPLAINTS/PURPOSE OF CONSULTATION:  Follow up of diagnosed rectosigmoid cancer   Oncology History   Cancer Staging Rectal cancer [facility]) Staging form: Colon and Rectum, AJCC 8th Edition - Clinical stage from 10/14/2016: Stage IIIB (cT3, cN1, cM0) - Signed by Truitt Merle, MD on 10/24/2016       Rectal cancer ([place])   10/14/2016 Initial Diagnosis    Rectal cancer ([place])      10/14/2016 Procedure    Colonoscopy by Dr. Hilarie Fredrickson showed a fungating partially upchucking large mass in the rectosigmoid colon, the mass begins about 10 cm from the dentate line and measured 5 cm in length. Biopsy was obtained. Multiple small and largemouth diverticula were found in the sigmoid colon and the ascending colon.      10/14/2016 Initial Biopsy    Rectosigmoid mass biopsy showed adenocarcinoma       10/15/2016 Imaging    CT of chest, abdomen and pelvis with contrast showed a 4 cm annular colonic soft tissue mass near the rectosigmoid junction, consistent with known primary carcinoma. Several small her chronic lymph nodes measuring up to 34m, suspicious for local lymph node metastasis. No evidence of distant metastasis.      11/17/2016 - 12/25/2016 Radiation Therapy     Radiation Therapy by Dr. MLisbeth Renshaw     11/17/2016 -  Chemotherapy    Concurrent chemo Xeloda (1502min am and 100060mn evening) to go along with radiation.   Hold chemo due to platelet count 12/08/16       HISTORY OF PRESENTING ILLNESS:  Allison Dunlap 66o. female is here because of Her recently diagnosed  rectosigmoid colon cancer. She was referred by her gastroenterologist Dr. PyrHilarie Fredricksonhe presents to my clinic by herself today.  She has been having constipationa and diarrhea for 6 months, and daily hematochezia, a tea spoon each time, no rectal pain, no bloating, nause or other symptoms, no change of appetie and weight. She does not have a primary care physician, and [facility] She was seen by Dr. PyrHilarie Fredricksonhe underwent colonoscopy, which showed a large mass in the rectosigmoid junction. Biopsy showed adenocarcinoma.  She was diagnosed with ulcerative colitis in her 66's, resolved and now.  She has had thrombocytopenia since 2013, she has bone marrow biopsy, which was normal per pt, never required any treatment    She is widowed, no children, [place]   CURRENT THERAPY: pending surgery   INTERVAL HISTORY: RobNasira Januszturns today for follow up. She has been doing well and feels she has recovered from radiation smoothly. She denies any bowel movement problems or swelling. She is scheduled for splenectomy by Dr. GroJohney Mained rectal surgery by Dr. ThoMarcello Moores 7/25. She wants to keep as much mobility as possible for treatments   MEDICAL HISTORY:  Past Medical History:  Diagnosis Date  . [place] ([facility]   rectal cancer-adenocarcinoma  . [place] [place] . Temporary low platelet count ([facility]   SURGICAL HISTORY: Past Surgical History:  Procedure Laterality Date  . NO PAST SURGERIES  SOCIAL HISTORY: Social History   Social History  . Marital status: Widowed    Spouse name: N/A  . Number of children: 0  . Years of education: N/A   Occupational History  . retired    Social History Main Topics  . Smoking status: Former Smoker    Packs/day: 0.50    Years: 30.00    Quit date: 07/28/1997  . Smokeless tobacco: Never Used  . Alcohol use 4.2 oz/week    7 Glasses of wine per week     Comment:  wine and beer  . Drug use: No  . Sexual activity: Not on file   Other Topics Concern  . Not on file   Social History Narrative  . No narrative on file    FAMILY HISTORY: Family History  Problem Relation Age of Onset  . Alzheimer's disease Mother   . Liver disease Father 38       failure  . Breast cancer Sister   . Cancer Sister 41       breast cancer   . Heart disease Maternal Grandfather   . Cancer Paternal Grandmother        type unknown    ALLERGIES:  has No Known Allergies.  MEDICATIONS:  Current Outpatient Prescriptions  Medication Sig Dispense Refill  . Cholecalciferol (VITAMIN D PO) Take 1 tablet by mouth daily.    . Cyanocobalamin (VITAMIN B-12 PO) Take 1 tablet by mouth daily.    . Multiple Vitamins-Minerals (OCUVITE PO) Take 1 tablet by mouth daily.     No current facility-administered medications for this visit.     REVIEW OF SYSTEMS:   Constitutional: Denies fevers, chills or abnormal night sweats Eyes: Denies blurriness of vision, double vision or watery eyes Ears, nose, mouth, throat, and face: Denies mucositis or sore throat Respiratory: Denies cough, dyspnea or wheezes Cardiovascular: Denies palpitation, chest discomfort or lower extremity swelling Gastrointestinal:  Denies nausea, heartburn  Skin: Lymphatics: Denies new lymphadenopathy or easy bruising Neurological:Denies numbness, tingling or new weaknesses Behavioral/Psych: Mood is stable, no new changes  All other systems were reviewed with the patient and are negative.  PHYSICAL EXAMINATION:  ECOG PERFORMANCE STATUS: 0  Vitals:   01/27/17 0957  BP: 138/77  Pulse: 74  Resp: 18  Temp: 97.2 F (36.2 C)   Filed Weights   01/27/17 0957  Weight: 116 lb 9.6 oz (52.9 kg)    GENERAL:alert, no distress and comfortable SKIN: skin color, texture, turgor are normal,  or significant lesions  EYES: normal, conjunctiva are pink and non-injected, sclera clear OROPHARYNX:no exudate, no erythema  and lips, buccal mucosa, and tongue normal  NECK: supple, thyroid normal size, non-tender, without nodularity LYMPH:  no palpable lymphadenopathy in the cervical, axillary or inguinal LUNGS: clear to auscultation and percussion with normal breathing effort HEART: regular rate & rhythm and no murmurs and no lower extremity edema ABDOMEN:abdomen soft, non-tender and normal bowel sounds Musculoskeletal:no cyanosis of digits and no clubbing  PSYCH: alert & oriented x 3 with fluent speech NEURO: no focal motor/sensory deficits  LABORATORY DATA:  I have reviewed the data as listed CBC Latest Ref Rng & Units 01/27/2017 12/23/2016 12/19/2016  WBC 3.9 - 10.3 10e3/uL 4.7 3.5(L) 3.8(L)  Hemoglobin 11.6 - 15.9 g/dL 14.0 13.0 14.4  Hematocrit 34.8 - 46.6 % 42.0 38.6 43.1  Platelets 145 - 400 10e3/uL 27(L) 82(L) 11(L)   CMP Latest Ref Rng & Units 01/27/2017 12/23/2016 12/15/2016  Glucose 70 - 140 mg/dl 95 88 101  BUN 7.0 - 26.0 mg/dL 13.2 12.5 8.9  Creatinine 0.6 - 1.1 mg/dL 0.8 0.7 0.8  Sodium 136 - 145 mEq/L 143 141 141  Potassium 3.5 - 5.1 mEq/L 4.2 4.0 4.2  Chloride 96 - 112 mEq/L - - -  CO2 22 - 29 mEq/L 25 27 29   Calcium 8.4 - 10.4 mg/dL 9.4 9.2 9.2  Total Protein 6.4 - 8.3 g/dL 6.9 7.2 6.3(L)  Total Bilirubin 0.20 - 1.20 mg/dL 0.62 0.44 0.97  Alkaline Phos 40 - 150 U/L 90 81 92  AST 5 - 34 U/L 24 21 15   ALT 0 - 55 U/L 17 21 18    PATHOLOGY REPORT  Diagnosis 10/14/2016 Colon, biopsy, rectosigmoid tumor - ADENOCARCINOMA.  RADIOGRAPHIC STUDIES: I have personally reviewed the radiological images as listed and agreed with the findings in the report. No results found.   CT CAP W Contrast: 10/15/16 IMPRESSION: 4 cm annular colonic soft tissue mass near the rectosigmoid junction, consistent with known primary carcinoma. Several small pericolonic lymph nodes measuring up to 10 mm, suspicious for local lymph node metastases. No evidence of distant metastatic disease. Proximal sigmoid  diverticulosis.  No evidence of diverticulitis.  Colonoscopy 10/14/2016 - The perianal exam findings include a perianal fungal rash. - The digital rectal exam was normal. - The terminal ileum appeared normal. - A fungating partially obstructing large mass was found in the recto-sigmoid colon. The mass was circumferential. The mass begins about 10 cm from the dentate line and measured five cm in length. Oozing was present. Multiple biopsies were obtained with cold forceps for histology in a targeted manner. Area proximal to the tumor was tattooed on opposite walls with an injection of 3 mL total of Spot (carbon black). - Multiple small and large-mouthed diverticula were found in the sigmoid colon and descending colon. - The retroflexed view of the distal rectum and anal verge was normal and showed no anal or rectal abnormalities.  ASSESSMENT & PLAN: 66 y.o.  Caucasian female, with past medical history of ulcerative colitis, thrombocytopenia, presented with diarrhea, constipation, and persistent hematochezia.  1. Rectosigmoid adenocarcinoma, cT3N1M0, stage IIIB, MSI-pending  -I previously reviewed his CT scan findings, colonoscopy and biopsy pathology results in great details with patient. -The tumor is located in the proximal rectum and rectosigmoid junction. -Prior CT scan showed several perirectal lymph nodes, suspicious for node metastasis. She likely has locally advanced disease. -I previously presented her case in our GI tumor Board. Due to the location of the cancer, Dr. Marcello Moores recommends neoadjuvant chemotherapy and radiation, followed by surgical resection. -Tumor Board feels her disease is likely T3 N1 based on CT scan findings, EUS or MRI for staging is probably not needed. -I previously recommend neoadjuvant concurrent chemotherapy with Xeloda or 5-FU. Patient opted Xeloda. - Patient has start concurrent chemo and radiation therapy on 11/17/16, tolerated well, chemo has been held  since 5/14 due to severe thrombocytopenia -He has recovered well from radiation, is scheduled to have surgery on July 25 by Dr. Marcello Moores  -We reviewed the role of adjuvant chemotherapy. Given her advanced stage, positive nodes, she likely would need adjuvant FOLFOX or CAPOX, or single agent Xeloda, depend on her response to radiation. However her thrombocytopenia may be a significant concern for chemotherapy. I recommend splenectomy, she finally agreed to. - her surgery is scheduled for 7/25 - I again discussed adjuvant chemotherapy options after her surgery. I strongly encouraged her to begin 3-4 weeks following surgery. Patient is reluctant to take intravenous chemotherapy, especially  FOLFOX. I will hold port placement during her surgery.  - f/u 2-3 weeks after surgery to finalize her chemo    2. 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 This is most consistent with ITP.  -She has not required any treatment. Her platelet counts are 76 on 11/17/16. This is increased from 44 in March 2018. -We previously discussed chemotherapy may cause thrombocytopenia. If needed, I would consider steroids treatment for her ITP during chemo.  -Due to low platelet count of 19K (12/08/16) we stopped Xeloda. while we monitor platelet.  -She has received dexamethasone, and IVIG for her ITP. Her platelet count has significant improved after treatment, especially after IVIG  -We previously  discussed that she needs adjuvant chemotherapy for her rectal cancer, however thrombocytopenia would be a big concern for her chemo  -I recommend splenectomy for her ITP, hopefully her thrombocytopenia will resolve, so she can tolerate adjuvant chemotherapy. She finally agreed. - splenectomy scheduled for 7/25 by Dr. Johney Maine.   3. History of ulcerative colitis -follow up with Dr. Hilarie Fredrickson  -We previously  discussed her that her colitis may exacerbate during the radiation and chemotherapy. -She may use Imodium as needed for diarrhea, she'll follow-up with Dr. Hilarie Fredrickson if needed.  Plan - surgery on 7/25 - lab and f/u on 8/8 - IVIG on 7/23, I discussed with Dr. Johney Maine who agrees   No orders of the defined types were placed in this encounter.   All questions were answered. The patient knows to call the clinic with any problems, questions or concerns.  I spent 25 minutes counseling the patient face to face. The total time spent in the appointment was 30 minutes and more than 50% was on counseling.  This document serves as a record of services personally performed by Truitt Merle, MD. It was created on her behalf by Brandt Loosen, a trained medical scribe. The creation of this record is based on the scribe's personal observations and the provider's statements to them. This document has been checked and approved by the attending provider.     Truitt Merle, MD 01/27/2017

## 2017-01-27 ENCOUNTER — Telehealth: Payer: Self-pay | Admitting: Hematology

## 2017-01-27 ENCOUNTER — Ambulatory Visit (HOSPITAL_BASED_OUTPATIENT_CLINIC_OR_DEPARTMENT_OTHER): Payer: Medicare Other | Admitting: Hematology

## 2017-01-27 ENCOUNTER — Encounter: Payer: Self-pay | Admitting: Hematology

## 2017-01-27 ENCOUNTER — Other Ambulatory Visit (HOSPITAL_BASED_OUTPATIENT_CLINIC_OR_DEPARTMENT_OTHER): Payer: Medicare Other

## 2017-01-27 VITALS — BP 138/77 | HR 74 | Temp 97.2°F | Resp 18 | Ht 59.0 in | Wt 116.6 lb

## 2017-01-27 DIAGNOSIS — C20 Malignant neoplasm of rectum: Secondary | ICD-10-CM

## 2017-01-27 DIAGNOSIS — D693 Immune thrombocytopenic purpura: Secondary | ICD-10-CM | POA: Diagnosis not present

## 2017-01-27 DIAGNOSIS — C19 Malignant neoplasm of rectosigmoid junction: Secondary | ICD-10-CM

## 2017-01-27 LAB — COMPREHENSIVE METABOLIC PANEL
ALT: 17 U/L (ref 0–55)
ANION GAP: 10 meq/L (ref 3–11)
AST: 24 U/L (ref 5–34)
Albumin: 3.7 g/dL (ref 3.5–5.0)
Alkaline Phosphatase: 90 U/L (ref 40–150)
BUN: 13.2 mg/dL (ref 7.0–26.0)
CHLORIDE: 108 meq/L (ref 98–109)
CO2: 25 meq/L (ref 22–29)
Calcium: 9.4 mg/dL (ref 8.4–10.4)
Creatinine: 0.8 mg/dL (ref 0.6–1.1)
EGFR: 83 mL/min/{1.73_m2} — ABNORMAL LOW (ref >90)
Glucose: 95 mg/dl (ref 70–140)
Potassium: 4.2 mEq/L (ref 3.5–5.1)
Sodium: 143 mEq/L (ref 136–145)
Total Bilirubin: 0.62 mg/dL (ref 0.20–1.20)
Total Protein: 6.9 g/dL (ref 6.4–8.3)

## 2017-01-27 LAB — CBC WITH DIFFERENTIAL/PLATELET
BASO%: 1 % (ref 0.0–2.0)
Basophils Absolute: 0 10*3/uL (ref 0.0–0.1)
EOS%: 3 % (ref 0.0–7.0)
Eosinophils Absolute: 0.1 10*3/uL (ref 0.0–0.5)
HCT: 42 % (ref 34.8–46.6)
HGB: 14 g/dL (ref 11.6–15.9)
LYMPH%: 16.8 % (ref 14.0–49.7)
MCH: 30.5 pg (ref 25.1–34.0)
MCHC: 33.2 g/dL (ref 31.5–36.0)
MCV: 91.7 fL (ref 79.5–101.0)
MONO#: 0.4 10*3/uL (ref 0.1–0.9)
MONO%: 8 % (ref 0.0–14.0)
NEUT#: 3.3 10*3/uL (ref 1.5–6.5)
NEUT%: 71.2 % (ref 38.4–76.8)
Platelets: 27 10*3/uL — ABNORMAL LOW (ref 145–400)
RBC: 4.58 10*6/uL (ref 3.70–5.45)
RDW: 16.3 % — ABNORMAL HIGH (ref 11.2–14.5)
WBC: 4.7 10*3/uL (ref 3.9–10.3)
lymph#: 0.8 10*3/uL — ABNORMAL LOW (ref 0.9–3.3)

## 2017-01-27 LAB — CEA (IN HOUSE-CHCC): CEA (CHCC-IN HOUSE): 3.51 ng/mL (ref 0.00–5.00)

## 2017-01-27 NOTE — Telephone Encounter (Signed)
Scheduled appt per 7/3 los - Gave patient AVS and calender per los.

## 2017-01-28 ENCOUNTER — Encounter: Payer: Self-pay | Admitting: Hematology

## 2017-02-11 NOTE — Progress Notes (Signed)
CBCdiff, CMP 01-27-17 epic  CT chest 10-15-16 epic

## 2017-02-11 NOTE — Patient Instructions (Signed)
[place]  02/11/2017   Your procedure is scheduled on: 02-18-17  Report to [facility]  Entrance Take [place]  elevators to 3rd floor to  [place] at [phone].    Call this number if you have problems the morning of surgery [phone]    Remember: ONLY 1 PERSON MAY GO WITH YOU TO SHORT STAY TO GET  READY MORNING OF [place].  Do not eat food After Midnight on Monday 02-16-17. Drink plenty of clear liquids all day Tuesday 02-17-17 on day of bowel prep and follow al of your surgeon's instructions for bowel prep. Nothing by mouth after midnight on Tuesday !     Take these medicines the morning of surgery with A SIP OF WATER: none                                You may not have any metal on your body including hair pins and              piercings  Do not wear jewelry, make-up, lotions, powders or perfumes, deodorant             Do not wear nail polish.  Do not shave  48 hours prior to surgery.     Do not bring valuables to the hospital. [place].  Contacts, dentures or bridgework may not be worn into surgery.  Leave suitcase in the car. After surgery it may be brought to your room.               Please read over the following fact sheets you were given: _____________________________________________________________________      CLEAR LIQUID DIET   Foods Allowed                                                                     Foods Excluded  Coffee and tea, regular and decaf                             liquids that you cannot  Plain Jell-O in any flavor                                             see through such as: Fruit ices (not with fruit pulp)                                     milk, soups, orange juice  Iced Popsicles                                    All solid food Carbonated beverages, regular and diet  Cranberry, grape and apple juices Sports  drinks like Gatorade Lightly seasoned clear broth or consume(fat free) Sugar, honey syrup  Sample Menu Breakfast                                Lunch                                     Supper Cranberry juice                    Beef broth                            Chicken broth Jell-O                                     Grape juice                           Apple juice Coffee or tea                        Jell-O                                      Popsicle                                                Coffee or tea                        Coffee or tea  _____________________________________________________________________  [facility] - Preparing for Surgery Before surgery, you can play an important role.  Because skin is not sterile, your skin needs to be as free of germs as possible.  You can reduce the number of germs on your skin by washing with CHG (chlorahexidine gluconate) soap before surgery.  CHG is an antiseptic cleaner which kills germs and bonds with the skin to continue killing germs even after washing. Please DO NOT use if you have an allergy to CHG or antibacterial soaps.  If your skin becomes reddened/irritated stop using the CHG and inform your nurse when you arrive at Short Stay. Do not shave (including legs and underarms) for at least 48 hours prior to the first CHG shower.  You may shave your face/neck. Please follow these instructions carefully:  1.  Shower with CHG Soap the night before surgery and the  morning of Surgery.  2.  If you choose to wash your hair, wash your hair first as usual with your  normal  shampoo.  3.  After you shampoo, rinse your hair and body thoroughly to remove the  shampoo.                           4.  Use CHG as you would any other liquid soap.  You can apply chg directly  to the skin and wash  Gently with a scrungie or clean washcloth.  5.  Apply the CHG Soap to your body ONLY FROM THE NECK DOWN.   Do not use on face/  open                           Wound or open sores. Avoid contact with eyes, ears mouth and genitals (private parts).                       Wash face,  Genitals (private parts) with your normal soap.             6.  Wash thoroughly, paying special attention to the area where your surgery  will be performed.  7.  Thoroughly rinse your body with warm water from the neck down.  8.  DO NOT shower/wash with your normal soap after using and rinsing off  the CHG Soap.                9.  Pat yourself dry with a clean towel.            10.  Wear clean pajamas.            11.  Place clean sheets on your bed the night of your first shower and do not  sleep with pets. Day of Surgery : Do not apply any lotions/deodorants the morning of surgery.  Please wear clean clothes to the hospital/surgery center.  FAILURE TO FOLLOW THESE INSTRUCTIONS MAY RESULT IN THE CANCELLATION OF YOUR SURGERY PATIENT SIGNATURE_________________________________  NURSE SIGNATURE__________________________________  ________________________________________________________________________

## 2017-02-12 ENCOUNTER — Encounter (HOSPITAL_COMMUNITY)
Admission: RE | Admit: 2017-02-12 | Discharge: 2017-02-12 | Disposition: A | Discharge status: Home / Self Care | Payer: Medicare Other | Source: Ambulatory Visit | Attending: General Surgery | Admitting: General Surgery

## 2017-02-12 ENCOUNTER — Encounter (HOSPITAL_COMMUNITY): Payer: Self-pay

## 2017-02-12 DIAGNOSIS — Z01818 Encounter for other preprocedural examination: Secondary | ICD-10-CM | POA: Diagnosis not present

## 2017-02-12 DIAGNOSIS — C20 Malignant neoplasm of rectum: Secondary | ICD-10-CM | POA: Insufficient documentation

## 2017-02-12 DIAGNOSIS — D693 Immune thrombocytopenic purpura: Secondary | ICD-10-CM | POA: Diagnosis not present

## 2017-02-12 LAB — CBC
HCT: 41.3 % (ref 36.0–46.0)
Hemoglobin: 14.1 g/dL (ref 12.0–15.0)
MCH: 31.1 pg (ref 26.0–34.0)
MCHC: 34.1 g/dL (ref 30.0–36.0)
MCV: 91 fL (ref 78.0–100.0)
Platelets: 31 10*3/uL — ABNORMAL LOW (ref 150–400)
RBC: 4.54 MIL/uL (ref 3.87–5.11)
RDW: 14 % (ref 11.5–15.5)
WBC: 4.6 10*3/uL (ref 4.0–10.5)

## 2017-02-12 LAB — ABO/RH: ABO/RH(D): O POS

## 2017-02-12 NOTE — Progress Notes (Signed)
CBC results routed via epic. THOMAS MD has prior awareness of the extremely low platelet count as pt is to have splenectomy to address this as well as resection

## 2017-02-12 NOTE — Consult Note (Signed)
[facility] Nurse requested for preoperative stoma site marking  Discussed surgical procedure and stoma creation with patient and family.  Explained role of the [facility] nurse team.  Provided the patient with educational booklet/DVD and provided samples of pouching options.  Answered patient and family questions.   Examined patient lying, sitting, and standing in order to place the marking in the patient's visual field, away from any creases or abdominal contour issues and within the rectus muscle.  Attempted to mark below the patient's belt line.   Marked for colostomy in the LLQ  3  cm to the left of the umbilicus and  3 cm below the umbilicus.    Patient's abdomen cleansed with CHG wipes at site markings, allowed to air dry prior to marking.Covered mark with thin film transparent dressing to preserve mark until date of surgery.   [place] Nurse team will follow up with patient after surgery for continue ostomy care and teaching.   Domenic Moras RN BSN [place] Pager [phone]

## 2017-02-13 ENCOUNTER — Other Ambulatory Visit: Payer: Self-pay | Admitting: Hematology

## 2017-02-16 ENCOUNTER — Other Ambulatory Visit: Payer: Medicare Other

## 2017-02-16 ENCOUNTER — Ambulatory Visit (HOSPITAL_BASED_OUTPATIENT_CLINIC_OR_DEPARTMENT_OTHER): Payer: Medicare Other

## 2017-02-16 VITALS — BP 117/75 | HR 66 | Temp 97.7°F | Resp 16

## 2017-02-16 DIAGNOSIS — C20 Malignant neoplasm of rectum: Secondary | ICD-10-CM

## 2017-02-16 DIAGNOSIS — D693 Immune thrombocytopenic purpura: Secondary | ICD-10-CM | POA: Diagnosis present

## 2017-02-16 MED ORDER — HEPARIN SOD (PORK) LOCK FLUSH 100 UNIT/ML IV SOLN
500.0000 [IU] | Freq: Once | INTRAVENOUS | Status: DC | PRN
  Filled 2017-02-16: qty 5

## 2017-02-16 MED ORDER — SODIUM CHLORIDE 0.9 % IV SOLN
Freq: Once | INTRAVENOUS | Status: AC
  Administered 2017-02-16: 10:00:00 via INTRAVENOUS

## 2017-02-16 MED ORDER — HEPARIN SOD (PORK) LOCK FLUSH 100 UNIT/ML IV SOLN
250.0000 [IU] | Freq: Once | INTRAVENOUS | Status: DC | PRN
  Filled 2017-02-16: qty 5

## 2017-02-16 MED ORDER — IMMUNE GLOBULIN (HUMAN) 20 GM/200ML IV SOLN
60.0000 g | Freq: Once | INTRAVENOUS | Status: AC
  Administered 2017-02-16: 60 g via INTRAVENOUS
  Filled 2017-02-16: qty 600

## 2017-02-16 MED ORDER — SODIUM CHLORIDE 0.9% FLUSH
10.0000 mL | INTRAVENOUS | Status: DC | PRN
  Filled 2017-02-16: qty 10

## 2017-02-16 MED ORDER — DIPHENHYDRAMINE HCL 25 MG PO CAPS
25.0000 mg | ORAL_CAPSULE | Freq: Once | ORAL | Status: AC
  Administered 2017-02-16: 25 mg via ORAL

## 2017-02-16 MED ORDER — SODIUM CHLORIDE 0.9% FLUSH
3.0000 mL | Freq: Once | INTRAVENOUS | Status: DC | PRN
  Filled 2017-02-16: qty 10

## 2017-02-16 MED ORDER — ACETAMINOPHEN 325 MG PO TABS
ORAL_TABLET | ORAL | Status: AC
  Filled 2017-02-16: qty 2

## 2017-02-16 MED ORDER — DIPHENHYDRAMINE HCL 25 MG PO CAPS
ORAL_CAPSULE | ORAL | Status: AC
  Filled 2017-02-16: qty 1

## 2017-02-16 MED ORDER — ALTEPLASE 2 MG IJ SOLR
2.0000 mg | Freq: Once | INTRAMUSCULAR | Status: DC | PRN
Start: 2017-02-16 — End: 2017-02-16
  Filled 2017-02-16: qty 2

## 2017-02-16 MED ORDER — ACETAMINOPHEN 325 MG PO TABS
650.0000 mg | ORAL_TABLET | Freq: Once | ORAL | Status: AC
  Administered 2017-02-16: 650 mg via ORAL

## 2017-02-16 NOTE — Progress Notes (Signed)
Pt tolerated IVIG well.  VSS.  Pt stayed for observation about 15 minutes following completion of infusion.  Pt stated she felt fine and was ready to go.  IV d/c'd.  Instructed pt to return to ED if changes occur. Pt verbalized understanding.

## 2017-02-16 NOTE — Patient Instructions (Signed)

## 2017-02-17 NOTE — Anesthesia Preprocedure Evaluation (Addendum)
Anesthesia Evaluation  Patient identified by MRN, date of birth, ID band Patient awake    Reviewed: Allergy & Precautions, NPO status , Patient's Chart, lab work & pertinent test results  History of Anesthesia Complications Negative for: history of anesthetic complications  Airway Mallampati: II  TM Distance: >3 FB Neck ROM: Full    Dental  (+) Caps, Implants, Dental Advisory Given   Pulmonary former smoker (quit 1999),    breath sounds clear to auscultation       Cardiovascular (-) anginanegative cardio ROS   Rhythm:Regular Rate:Normal     Neuro/Psych negative neurological ROS     GI/Hepatic Neg liver ROS, Rectal adenoCa: chemo   Endo/Other  negative endocrine ROS  Renal/GU negative Renal ROS     Musculoskeletal   Abdominal   Peds  Hematology Thrombocytopenia: plt 31K: received IVIG 02/16/17   Anesthesia Other Findings   Reproductive/Obstetrics                            Anesthesia Physical Anesthesia Plan  ASA: III  Anesthesia Plan: General   Post-op Pain Management:    Induction: Intravenous  PONV Risk Score and Plan: 4 or greater and Ondansetron, Dexamethasone, Midazolam and Scopolamine patch - Pre-op  Airway Management Planned: Oral ETT  Additional Equipment: Arterial line  Intra-op Plan:   Post-operative Plan: Possible Post-op intubation/ventilation  Informed Consent: I have reviewed the patients History and Physical, chart, labs and discussed the procedure including the risks, benefits and alternatives for the proposed anesthesia with the patient or authorized representative who has indicated his/her understanding and acceptance.   Dental advisory given  Plan Discussed with: CRNA and Surgeon  Anesthesia Plan Comments: (Plan routine monitors, A line, GETA with possible transfusion, possible post op intubation)        Anesthesia Quick Evaluation

## 2017-02-18 ENCOUNTER — Encounter (HOSPITAL_COMMUNITY): Payer: Self-pay | Admitting: *Deleted

## 2017-02-18 ENCOUNTER — Inpatient Hospital Stay (HOSPITAL_COMMUNITY)
Admission: RE | Admit: 2017-02-18 | Discharge: 2017-02-24 | DRG: 330 | Disposition: A | Discharge status: Home / Self Care | Payer: Medicare Other | Source: Ambulatory Visit | Attending: General Surgery | Admitting: General Surgery

## 2017-02-18 ENCOUNTER — Encounter (HOSPITAL_COMMUNITY)
Admission: RE | Disposition: A | Discharge status: Home / Self Care | Payer: Self-pay | Source: Ambulatory Visit | Attending: General Surgery

## 2017-02-18 ENCOUNTER — Inpatient Hospital Stay (HOSPITAL_COMMUNITY): Payer: Medicare Other | Admitting: Anesthesiology

## 2017-02-18 DIAGNOSIS — D6949 Other primary thrombocytopenia: Secondary | ICD-10-CM | POA: Diagnosis not present

## 2017-02-18 DIAGNOSIS — T451X5A Adverse effect of antineoplastic and immunosuppressive drugs, initial encounter: Secondary | ICD-10-CM | POA: Diagnosis not present

## 2017-02-18 DIAGNOSIS — D6959 Other secondary thrombocytopenia: Secondary | ICD-10-CM

## 2017-02-18 DIAGNOSIS — Z923 Personal history of irradiation: Secondary | ICD-10-CM

## 2017-02-18 DIAGNOSIS — C20 Malignant neoplasm of rectum: Secondary | ICD-10-CM | POA: Diagnosis not present

## 2017-02-18 DIAGNOSIS — Z9221 Personal history of antineoplastic chemotherapy: Secondary | ICD-10-CM | POA: Diagnosis not present

## 2017-02-18 DIAGNOSIS — D693 Immune thrombocytopenic purpura: Secondary | ICD-10-CM | POA: Diagnosis present

## 2017-02-18 DIAGNOSIS — Z87891 Personal history of nicotine dependence: Secondary | ICD-10-CM | POA: Diagnosis not present

## 2017-02-18 DIAGNOSIS — D696 Thrombocytopenia, unspecified: Secondary | ICD-10-CM | POA: Diagnosis not present

## 2017-02-18 DIAGNOSIS — K567 Ileus, unspecified: Secondary | ICD-10-CM | POA: Diagnosis not present

## 2017-02-18 HISTORY — PX: INTRAOPERATIVE CHOLANGIOGRAM: SHX5230

## 2017-02-18 HISTORY — PX: XI ROBOTIC ASSISTED LOWER ANTERIOR RESECTION: SHX6558

## 2017-02-18 HISTORY — PX: LAPAROSCOPIC SPLENECTOMY: SHX409

## 2017-02-18 LAB — CBC WITH DIFFERENTIAL/PLATELET
Basophils Absolute: 0 10*3/uL (ref 0.0–0.1)
Basophils Relative: 1 %
EOS ABS: 0.1 10*3/uL (ref 0.0–0.7)
Eosinophils Relative: 2 %
HCT: 38 % (ref 36.0–46.0)
HEMOGLOBIN: 13.1 g/dL (ref 12.0–15.0)
LYMPHS ABS: 0.7 10*3/uL (ref 0.7–4.0)
LYMPHS PCT: 25 %
MCH: 30.8 pg (ref 26.0–34.0)
MCHC: 34.5 g/dL (ref 30.0–36.0)
MCV: 89.4 fL (ref 78.0–100.0)
Monocytes Absolute: 0.4 10*3/uL (ref 0.1–1.0)
Monocytes Relative: 15 %
NEUTROS ABS: 1.6 10*3/uL — AB (ref 1.7–7.7)
NEUTROS PCT: 58 %
Platelets: 59 10*3/uL — ABNORMAL LOW (ref 150–400)
RBC: 4.25 MIL/uL (ref 3.87–5.11)
RDW: 13.8 % (ref 11.5–15.5)
WBC: 2.8 10*3/uL — AB (ref 4.0–10.5)

## 2017-02-18 LAB — CBC
HEMATOCRIT: 33.5 % — AB (ref 36.0–46.0)
HEMOGLOBIN: 11.2 g/dL — AB (ref 12.0–15.0)
MCH: 30.6 pg (ref 26.0–34.0)
MCHC: 33.4 g/dL (ref 30.0–36.0)
MCV: 91.5 fL (ref 78.0–100.0)
Platelets: 58 10*3/uL — ABNORMAL LOW (ref 150–400)
RBC: 3.66 MIL/uL — AB (ref 3.87–5.11)
RDW: 14 % (ref 11.5–15.5)
WBC: 13.3 10*3/uL — AB (ref 4.0–10.5)

## 2017-02-18 LAB — TYPE AND SCREEN
ABO/RH(D): O POS
Antibody Screen: NEGATIVE

## 2017-02-18 SURGERY — RESECTION, RECTUM, LOW ANTERIOR, ROBOT-ASSISTED
Anesthesia: General | Site: Abdomen

## 2017-02-18 MED ORDER — SUFENTANIL CITRATE 50 MCG/ML IV SOLN
INTRAVENOUS | Status: AC
  Filled 2017-02-18: qty 1

## 2017-02-18 MED ORDER — LACTATED RINGERS IV SOLN
INTRAVENOUS | Status: DC | PRN
  Administered 2017-02-18 (×3): via INTRAVENOUS

## 2017-02-18 MED ORDER — SUFENTANIL CITRATE 50 MCG/ML IV SOLN
INTRAVENOUS | Status: DC | PRN
  Administered 2017-02-18 (×4): 10 ug via INTRAVENOUS
  Administered 2017-02-18 (×3): 5 ug via INTRAVENOUS
  Administered 2017-02-18 (×2): 10 ug via INTRAVENOUS
  Administered 2017-02-18: 5 ug via INTRAVENOUS
  Administered 2017-02-18 (×3): 10 ug via INTRAVENOUS

## 2017-02-18 MED ORDER — KCL IN DEXTROSE-NACL 20-5-0.45 MEQ/L-%-% IV SOLN
INTRAVENOUS | Status: DC
  Administered 2017-02-18 – 2017-02-22 (×6): via INTRAVENOUS
  Administered 2017-02-23: 50 mL/h via INTRAVENOUS
  Filled 2017-02-18 (×9): qty 1000

## 2017-02-18 MED ORDER — CEFOTETAN DISODIUM-DEXTROSE 2-2.08 GM-% IV SOLR
2.0000 g | Freq: Two times a day (BID) | INTRAVENOUS | Status: DC
  Administered 2017-02-18: 2 g via INTRAVENOUS
  Filled 2017-02-18: qty 50

## 2017-02-18 MED ORDER — CEFOTETAN DISODIUM-DEXTROSE 2-2.08 GM-% IV SOLR
INTRAVENOUS | Status: AC
  Filled 2017-02-18: qty 50

## 2017-02-18 MED ORDER — ALUM & MAG HYDROXIDE-SIMETH 200-200-20 MG/5ML PO SUSP
30.0000 mL | Freq: Four times a day (QID) | ORAL | Status: DC | PRN
  Administered 2017-02-20 – 2017-02-22 (×3): 30 mL via ORAL
  Filled 2017-02-18 (×4): qty 30

## 2017-02-18 MED ORDER — GABAPENTIN 300 MG PO CAPS
300.0000 mg | ORAL_CAPSULE | ORAL | Status: AC
  Administered 2017-02-18: 300 mg via ORAL
  Filled 2017-02-18: qty 1

## 2017-02-18 MED ORDER — HYDROMORPHONE HCL-NACL 0.5-0.9 MG/ML-% IV SOSY: 0.2500 mg | PREFILLED_SYRINGE | INTRAVENOUS | Status: DC | PRN

## 2017-02-18 MED ORDER — BUPIVACAINE LIPOSOME 1.3 % IJ SUSP
20.0000 mL | INTRAMUSCULAR | Status: DC
  Filled 2017-02-18: qty 20

## 2017-02-18 MED ORDER — HYDROMORPHONE HCL 2 MG/ML IJ SOLN
INTRAMUSCULAR | Status: AC
  Filled 2017-02-18: qty 1

## 2017-02-18 MED ORDER — PROPOFOL 10 MG/ML IV BOLUS
INTRAVENOUS | Status: DC | PRN
Start: 2017-02-18 — End: 2017-02-18
  Administered 2017-02-18: 20 mg via INTRAVENOUS
  Administered 2017-02-18: 120 mg via INTRAVENOUS
  Administered 2017-02-18: 30 mg via INTRAVENOUS

## 2017-02-18 MED ORDER — BUPIVACAINE-EPINEPHRINE 0.25% -1:200000 IJ SOLN
INTRAMUSCULAR | Status: AC
  Filled 2017-02-18: qty 1

## 2017-02-18 MED ORDER — MIDAZOLAM HCL 2 MG/2ML IJ SOLN: 0.5000 mg | Freq: Once | INTRAMUSCULAR | Status: DC | PRN

## 2017-02-18 MED ORDER — BUPIVACAINE LIPOSOME 1.3 % IJ SUSP
INTRAMUSCULAR | Status: DC | PRN
  Administered 2017-02-18: 20 mL

## 2017-02-18 MED ORDER — PROMETHAZINE HCL 25 MG/ML IJ SOLN
6.2500 mg | INTRAMUSCULAR | Status: AC | PRN
  Administered 2017-02-18 (×2): 6.25 mg via INTRAVENOUS

## 2017-02-18 MED ORDER — LACTATED RINGERS IR SOLN
Status: DC | PRN
  Administered 2017-02-18: 1000 mL

## 2017-02-18 MED ORDER — MIDAZOLAM HCL 5 MG/5ML IJ SOLN
INTRAMUSCULAR | Status: DC | PRN
Start: 2017-02-18 — End: 2017-02-18
  Administered 2017-02-18: 2 mg via INTRAVENOUS

## 2017-02-18 MED ORDER — DEXAMETHASONE SODIUM PHOSPHATE 10 MG/ML IJ SOLN
INTRAMUSCULAR | Status: DC | PRN
  Administered 2017-02-18: 5 mg via INTRAVENOUS

## 2017-02-18 MED ORDER — MORPHINE SULFATE (PF) 2 MG/ML IV SOLN
2.0000 mg | INTRAVENOUS | Status: DC | PRN
  Administered 2017-02-20: 2 mg via INTRAVENOUS
  Filled 2017-02-18: qty 1

## 2017-02-18 MED ORDER — ENOXAPARIN SODIUM 40 MG/0.4ML ~~LOC~~ SOLN
40.0000 mg | SUBCUTANEOUS | Status: DC
  Administered 2017-02-19 – 2017-02-23 (×5): 40 mg via SUBCUTANEOUS
  Filled 2017-02-18 (×6): qty 0.4

## 2017-02-18 MED ORDER — LACTATED RINGERS IV SOLN
INTRAVENOUS | Status: DC | PRN
  Administered 2017-02-18 (×2): via INTRAVENOUS

## 2017-02-18 MED ORDER — MEPERIDINE HCL 50 MG/ML IJ SOLN: 6.2500 mg | INTRAMUSCULAR | Status: DC | PRN

## 2017-02-18 MED ORDER — ALVIMOPAN 12 MG PO CAPS
12.0000 mg | ORAL_CAPSULE | ORAL | Status: AC
  Administered 2017-02-18: 12 mg via ORAL
  Filled 2017-02-18: qty 1

## 2017-02-18 MED ORDER — HYDROMORPHONE HCL 1 MG/ML IJ SOLN
INTRAMUSCULAR | Status: DC | PRN
  Administered 2017-02-18 (×2): 0.5 mg via INTRAVENOUS

## 2017-02-18 MED ORDER — EPHEDRINE SULFATE 50 MG/ML IJ SOLN
INTRAMUSCULAR | Status: DC | PRN
  Administered 2017-02-18: 5 mg via INTRAVENOUS
  Administered 2017-02-18: 10 mg via INTRAVENOUS

## 2017-02-18 MED ORDER — ONDANSETRON HCL 4 MG/2ML IJ SOLN
INTRAMUSCULAR | Status: DC | PRN
  Administered 2017-02-18: 4 mg via INTRAVENOUS

## 2017-02-18 MED ORDER — LIDOCAINE HCL (CARDIAC) 20 MG/ML IV SOLN
INTRAVENOUS | Status: DC | PRN
  Administered 2017-02-18: 20 mg via INTRAVENOUS

## 2017-02-18 MED ORDER — SUGAMMADEX SODIUM 200 MG/2ML IV SOLN
INTRAVENOUS | Status: DC | PRN
  Administered 2017-02-18: 100 mg via INTRAVENOUS

## 2017-02-18 MED ORDER — MIDAZOLAM HCL 2 MG/2ML IJ SOLN
INTRAMUSCULAR | Status: AC
  Filled 2017-02-18: qty 2

## 2017-02-18 MED ORDER — PROPOFOL 10 MG/ML IV BOLUS
INTRAVENOUS | Status: AC
  Filled 2017-02-18: qty 40

## 2017-02-18 MED ORDER — PHENYLEPHRINE HCL 10 MG/ML IJ SOLN
INTRAVENOUS | Status: DC | PRN
  Administered 2017-02-18: 50 ug/min via INTRAVENOUS

## 2017-02-18 MED ORDER — PROMETHAZINE HCL 25 MG/ML IJ SOLN
INTRAMUSCULAR | Status: AC
  Administered 2017-02-18: 14:00:00
  Filled 2017-02-18: qty 1

## 2017-02-18 MED ORDER — ONDANSETRON HCL 4 MG/2ML IJ SOLN
4.0000 mg | Freq: Four times a day (QID) | INTRAMUSCULAR | Status: DC | PRN
  Administered 2017-02-20 – 2017-02-22 (×3): 4 mg via INTRAVENOUS
  Filled 2017-02-18 (×3): qty 2

## 2017-02-18 MED ORDER — GABAPENTIN 300 MG PO CAPS
300.0000 mg | ORAL_CAPSULE | Freq: Two times a day (BID) | ORAL | Status: DC
  Administered 2017-02-18 – 2017-02-20 (×4): 300 mg via ORAL
  Filled 2017-02-18 (×4): qty 1

## 2017-02-18 MED ORDER — DEXTROSE 5 % IV SOLN
2.0000 g | INTRAVENOUS | Status: DC
  Filled 2017-02-18: qty 2

## 2017-02-18 MED ORDER — CEFOTETAN DISODIUM-DEXTROSE 2-2.08 GM-% IV SOLR
2.0000 g | Freq: Two times a day (BID) | INTRAVENOUS | Status: AC
  Administered 2017-02-18: 2 g via INTRAVENOUS
  Filled 2017-02-18: qty 50

## 2017-02-18 MED ORDER — ALVIMOPAN 12 MG PO CAPS
12.0000 mg | ORAL_CAPSULE | Freq: Two times a day (BID) | ORAL | Status: DC
  Administered 2017-02-19: 12 mg via ORAL
  Filled 2017-02-18 (×2): qty 1

## 2017-02-18 MED ORDER — DIPHENHYDRAMINE HCL 12.5 MG/5ML PO ELIX: 12.5000 mg | ORAL_SOLUTION | Freq: Four times a day (QID) | ORAL | Status: DC | PRN

## 2017-02-18 MED ORDER — ONDANSETRON HCL 4 MG PO TABS: 4.0000 mg | ORAL_TABLET | Freq: Four times a day (QID) | ORAL | Status: DC | PRN

## 2017-02-18 MED ORDER — 0.9 % SODIUM CHLORIDE (POUR BTL) OPTIME
TOPICAL | Status: DC | PRN
  Administered 2017-02-18: 2000 mL

## 2017-02-18 MED ORDER — SODIUM CHLORIDE 0.9 % IJ SOLN
INTRAMUSCULAR | Status: AC
  Filled 2017-02-18: qty 20

## 2017-02-18 MED ORDER — DIPHENHYDRAMINE HCL 50 MG/ML IJ SOLN: 12.5000 mg | Freq: Four times a day (QID) | INTRAMUSCULAR | Status: DC | PRN

## 2017-02-18 MED ORDER — ACETAMINOPHEN 500 MG PO TABS
1000.0000 mg | ORAL_TABLET | Freq: Four times a day (QID) | ORAL | Status: DC
  Administered 2017-02-18 – 2017-02-21 (×13): 1000 mg via ORAL
  Filled 2017-02-18 (×15): qty 2

## 2017-02-18 MED ORDER — HEPARIN SODIUM (PORCINE) 5000 UNIT/ML IJ SOLN
5000.0000 [IU] | Freq: Once | INTRAMUSCULAR | Status: AC
  Administered 2017-02-18: 5000 [IU] via SUBCUTANEOUS
  Filled 2017-02-18: qty 1

## 2017-02-18 MED ORDER — BUPIVACAINE-EPINEPHRINE 0.25% -1:200000 IJ SOLN
INTRAMUSCULAR | Status: DC | PRN
  Administered 2017-02-18: 45 mL

## 2017-02-18 MED ORDER — ROCURONIUM BROMIDE 100 MG/10ML IV SOLN
INTRAVENOUS | Status: DC | PRN
  Administered 2017-02-18 (×3): 50 mg via INTRAVENOUS

## 2017-02-18 MED ORDER — ACETAMINOPHEN 500 MG PO TABS
1000.0000 mg | ORAL_TABLET | ORAL | Status: AC
  Administered 2017-02-18: 1000 mg via ORAL
  Filled 2017-02-18: qty 2

## 2017-02-18 SURGICAL SUPPLY — 98 items
BLADE EXTENDED COATED 6.5IN (ELECTRODE) ×4 IMPLANT
CANNULA REDUC XI 12-8 STAPL (CANNULA) ×1
CANNULA REDUCER 12-8 DVNC XI (CANNULA) ×3 IMPLANT
CELLS DAT CNTRL 66122 CELL SVR (MISCELLANEOUS) IMPLANT
CLIP LIGATING HEM O LOK PURPLE (MISCELLANEOUS) IMPLANT
CLIP LIGATING HEMOLOK MED (MISCELLANEOUS) IMPLANT
COUNTER NEEDLE 20 DBL MAG RED (NEEDLE) ×4 IMPLANT
COVER MAYO STAND STRL (DRAPES) ×8 IMPLANT
COVER SURGICAL LIGHT HANDLE (MISCELLANEOUS) ×4 IMPLANT
COVER TIP SHEARS 8 DVNC (MISCELLANEOUS) ×3 IMPLANT
COVER TIP SHEARS 8MM DA VINCI (MISCELLANEOUS) ×1
DECANTER SPIKE VIAL GLASS SM (MISCELLANEOUS) ×4 IMPLANT
DERMABOND ADVANCED (GAUZE/BANDAGES/DRESSINGS) ×1
DERMABOND ADVANCED .7 DNX12 (GAUZE/BANDAGES/DRESSINGS) ×3 IMPLANT
DEVICE PMI PUNCTURE CLOSURE (MISCELLANEOUS) ×4 IMPLANT
DEVICE TROCAR PUNCTURE CLOSURE (ENDOMECHANICALS) IMPLANT
DRAIN CHANNEL 19F RND (DRAIN) ×4 IMPLANT
DRAPE ARM DVNC X/XI (DISPOSABLE) ×12 IMPLANT
DRAPE COLUMN DVNC XI (DISPOSABLE) ×3 IMPLANT
DRAPE DA VINCI XI ARM (DISPOSABLE) ×4
DRAPE DA VINCI XI COLUMN (DISPOSABLE) ×1
DRAPE SURG IRRIG POUCH 19X23 (DRAPES) ×4 IMPLANT
DRSG OPSITE POSTOP 4X10 (GAUZE/BANDAGES/DRESSINGS) IMPLANT
DRSG OPSITE POSTOP 4X6 (GAUZE/BANDAGES/DRESSINGS) ×4 IMPLANT
DRSG OPSITE POSTOP 4X8 (GAUZE/BANDAGES/DRESSINGS) IMPLANT
ELECT PENCIL ROCKER SW 15FT (MISCELLANEOUS) ×8 IMPLANT
ELECT REM PT RETURN 15FT ADLT (MISCELLANEOUS) ×4 IMPLANT
ENDOLOOP SUT PDS II  0 18 (SUTURE)
ENDOLOOP SUT PDS II 0 18 (SUTURE) IMPLANT
EVACUATOR SILICONE 100CC (DRAIN) ×4 IMPLANT
GAUZE SPONGE 4X4 12PLY STRL (GAUZE/BANDAGES/DRESSINGS) ×4 IMPLANT
GLOVE BIO SURGEON STRL SZ 6.5 (GLOVE) ×12 IMPLANT
GLOVE BIOGEL M STRL SZ7.5 (GLOVE) IMPLANT
GLOVE BIOGEL PI IND STRL 7.0 (GLOVE) ×9 IMPLANT
GLOVE BIOGEL PI INDICATOR 7.0 (GLOVE) ×3
GOWN STRL REUS W/TWL 2XL LVL3 (GOWN DISPOSABLE) ×12 IMPLANT
GOWN STRL REUS W/TWL LRG LVL3 (GOWN DISPOSABLE) ×4 IMPLANT
GOWN STRL REUS W/TWL XL LVL3 (GOWN DISPOSABLE) ×32 IMPLANT
GRASPER ENDOPATH ANVIL 10MM (MISCELLANEOUS) IMPLANT
HOLDER FOLEY CATH W/STRAP (MISCELLANEOUS) ×4 IMPLANT
IRRIG SUCT STRYKERFLOW 2 WTIP (MISCELLANEOUS) ×4
IRRIGATION SUCT STRKRFLW 2 WTP (MISCELLANEOUS) ×3 IMPLANT
NEEDLE INSUFFLATION 14GA 120MM (NEEDLE) ×4 IMPLANT
PACK CARDIOVASCULAR III (CUSTOM PROCEDURE TRAY) ×4 IMPLANT
PACK COLON (CUSTOM PROCEDURE TRAY) ×4 IMPLANT
PORT LAP GEL ALEXIS MED 5-9CM (MISCELLANEOUS) ×4 IMPLANT
POUCH ENDO CATCH II 15MM (MISCELLANEOUS) ×4 IMPLANT
RTRCTR WOUND ALEXIS 18CM MED (MISCELLANEOUS)
SCISSORS LAP 5X35 DISP (ENDOMECHANICALS) ×4 IMPLANT
SEAL CANN UNIV 5-8 DVNC XI (MISCELLANEOUS) ×9 IMPLANT
SEAL XI 5MM-8MM UNIVERSAL (MISCELLANEOUS) ×3
SEALER VESSEL DA VINCI XI (MISCELLANEOUS) ×1
SEALER VESSEL EXT DVNC XI (MISCELLANEOUS) ×3 IMPLANT
SLEEVE ADV FIXATION 5X100MM (TROCAR) IMPLANT
SOLUTION ELECTROLUBE (MISCELLANEOUS) ×4 IMPLANT
SPONGE DRAIN TRACH 4X4 STRL 2S (GAUZE/BANDAGES/DRESSINGS) ×4 IMPLANT
STAPLER 45 BLU RELOAD XI (STAPLE) IMPLANT
STAPLER 45 BLUE RELOAD XI (STAPLE)
STAPLER 45 GREEN RELOAD XI (STAPLE) ×1
STAPLER 45 GRN RELOAD XI (STAPLE) ×3 IMPLANT
STAPLER 45 WHITE RELOAD XI (STAPLE) ×2
STAPLER 45 WHT RELOAD XI (STAPLE) ×6 IMPLANT
STAPLER CANNULA SEAL DVNC XI (STAPLE) ×3 IMPLANT
STAPLER CANNULA SEAL XI (STAPLE) ×1
STAPLER CIRC CVD 29MM 37CM (STAPLE) ×4 IMPLANT
STAPLER SHEATH (SHEATH) ×1
STAPLER SHEATH ENDOWRIST DVNC (SHEATH) ×3 IMPLANT
STAPLER VISISTAT 35W (STAPLE) ×4 IMPLANT
SUT ETHILON 2 0 PS N (SUTURE) ×4 IMPLANT
SUT NOVA NAB GS-21 0 18 T12 DT (SUTURE) ×8 IMPLANT
SUT NOVA NAB GS-21 1 T12 (SUTURE) ×4 IMPLANT
SUT PDS AB 1 CTX 36 (SUTURE) IMPLANT
SUT PDS AB 1 TP1 96 (SUTURE) IMPLANT
SUT PROLENE 2 0 KS (SUTURE) ×4 IMPLANT
SUT SILK 2 0 (SUTURE) ×1
SUT SILK 2 0 SH CR/8 (SUTURE) ×4 IMPLANT
SUT SILK 2-0 18XBRD TIE 12 (SUTURE) ×3 IMPLANT
SUT SILK 3 0 (SUTURE) ×1
SUT SILK 3 0 SH CR/8 (SUTURE) ×4 IMPLANT
SUT SILK 3-0 18XBRD TIE 12 (SUTURE) ×3 IMPLANT
SUT V-LOC BARB 180 2/0GR6 GS22 (SUTURE)
SUT VIC AB 2-0 SH 18 (SUTURE) ×4 IMPLANT
SUT VIC AB 2-0 SH 27 (SUTURE) ×1
SUT VIC AB 2-0 SH 27X BRD (SUTURE) ×3 IMPLANT
SUT VIC AB 3-0 SH 18 (SUTURE) ×4 IMPLANT
SUT VIC AB 4-0 PS2 18 (SUTURE) ×4 IMPLANT
SUT VIC AB 4-0 PS2 27 (SUTURE) ×8 IMPLANT
SUT VICRYL 0 UR6 27IN ABS (SUTURE) ×4 IMPLANT
SUTURE V-LC BRB 180 2/0GR6GS22 (SUTURE) IMPLANT
SYR 10ML LL (SYRINGE) ×4 IMPLANT
SYS LAPSCP GELPORT 120MM (MISCELLANEOUS)
SYSTEM LAPSCP GELPORT 120MM (MISCELLANEOUS) IMPLANT
TOWEL OR 17X26 10 PK STRL BLUE (TOWEL DISPOSABLE) ×4 IMPLANT
TOWEL OR NON WOVEN STRL DISP B (DISPOSABLE) ×4 IMPLANT
TRAY FOLEY W/METER SILVER 16FR (SET/KITS/TRAYS/PACK) ×4 IMPLANT
TROCAR ADV FIXATION 5X100MM (TROCAR) ×4 IMPLANT
TUBING CONNECTING 10 (TUBING) IMPLANT
TUBING INSUFFLATION 10FT LAP (TUBING) ×4 IMPLANT

## 2017-02-18 NOTE — Op Note (Signed)
02/18/2017  12:36 PM  PATIENT:  Allison Dunlap  66 y.o. female  Patient Care Team: Patient, No Pcp Per as PCP - General ([place]) Pyrtle, Lajuan Lines, MD as Consulting Physician (Gastroenterology) Leighton Ruff, MD as Consulting Physician (General Surgery) Kyung Rudd, MD as Consulting Physician (Radiation Oncology) Truitt Merle, MD as Consulting Physician (Hematology)  PRE-OPERATIVE DIAGNOSIS:  Rectal cancer, ITP  POST-OPERATIVE DIAGNOSIS:  Rectal cancer, ITP  PROCEDURE:   XI ROBOTIC ASSISTED LOWER ANTERIOR RESECTION WITH SPLENIC FLEXURE MOBILIZATION XI ROBOTIC ASSISTED SPLENECTOMY (by Dr Johney Maine) INTRAOPERATIVE ASSESSMENT OF VASCULAR PERFUSION    Surgeon(s): Leighton Ruff, MD Michael Boston, MD  ASSISTANT: Dr Johney Maine   ANESTHESIA:   local and general  EBL: 124m  Total I/O In: 3000 [I.V.:3000] Out: 605 [Urine:455; Blood:150]  Delay start of Pharmacological VTE agent (>24hrs) due to surgical blood loss or risk of bleeding:  no  DRAINS: (60F) Jackson-Pratt drain(s) with closed bulb suction in the pelvis   SPECIMEN:  Source of Specimen:  Spleen, rectosigmoid, distal rectal margin  DISPOSITION OF SPECIMEN:  PATHOLOGY  COUNTS:  YES  PLAN OF CARE: Admit to inpatient   PATIENT DISPOSITION:  PACU - hemodynamically stable.  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   I recommended segmental resection:  The anatomy & physiology of the digestive tract was discussed.  The pathophysiology was discussed.  Natural history risks without surgery was discussed.   I worked to give an overview of the disease and the frequent need to have multispecialty involvement.  I feel the risks of no intervention will lead to serious problems that outweigh the operative risks; therefore, I recommended a partial colectomy to remove the pathology.  Laparoscopic & open techniques were discussed.   Risks such as bleeding, infection, abscess, leak, reoperation, possible ostomy, hernia,  heart attack, death, and other risks were discussed.  I noted a good likelihood this will help address the problem.   Goals of post-operative recovery were discussed as well.    The patient expressed understanding & wished to proceed with surgery.  OR FINDINGS:   Patient had a mass at ~10 cm from the anal verge.  No obvious metastatic disease on visceral parietal peritoneum or liver.  The anastomosis rests ~7 cm from the anal verge by rigid proctoscopy.  DESCRIPTION:   Informed consent was confirmed.  The patient underwent general anaesthesia without difficulty.  The patient was positioned appropriately.  VTE prevention in place.  The patient's abdomen was clipped, prepped, & draped in a sterile fashion.  Surgical timeout confirmed our plan.  The patient was positioned in reverse Trendelenburg.  Abdominal entry was gained using a Varies needle in the LUQ.  Entry was clean.  I induced carbon dioxide insufflation.  A RUQ port was placed.  Camera inspection revealed no injury.  Extra ports were carefully placed under direct laparoscopic visualization.  Dr GJohney Mainethen proceeded with the splenectomy portion of the case.  This will be dictated separately.    After he completed this, I robotically took down the splenic flexure using the robotic vessel sealer.  Once this was completed, The patient was placed in Trendelenburg position and the robot was re-docked and the greater omentum and the the small bowel were reflected into the upper abdomen.  I scored the base of peritoneum of the right side of the mesentery of the left colon from the ligament of Treitz to the peritoneal reflection of the mid rectum.  The patient  had tattoo located in the upper rectum approximately 5 cm from the peritoneal reflection.  I elevated the sigmoid mesentery and enetered into the retro-mesenteric plane. We were able to identify the left ureter and gonadal vessels. We kept those posterior within the retroperitoneum and  elevated the left colon mesentery off that. I did isolated IMA pedicle but did not ligate it yet.  I continued distally and got into the avascular plane posterior to the mesorectum.  I developed this plane posteriorly using blunt dissection and the robotic vessel sealer for hemostasis. I continued down to the level of the coccyx. I then bluntly mobilized the lateral mesenteric edges. I divided the peritoneum down to the level of the peritoneal reflection. I could see the right and left ureters and stayed away from them. I then evaluated the rectum using the rigid sigmoidoscope. The tumor was identified on the right lateral side of the mid rectum approximately 10 cm from anal verge. I decided to proceed with a resection just distal to this.   I skeletonized the inferior mesenteric artery pedicle.  I went down to its takeoff from the aorta.  After confirming the left ureter was out of the way, I went ahead and ligated the inferior mesenteric artery pedicle with robotic vessel sealer  ~2cm above its takeoff from the aorta.    We ensured hemostasis. I skeletonized the mesorectum at the previously marked mid rectum using blunt dissection & bipolar robotic vessel sealer.  I divided the rectum using 2 green load robotic staplers. I mobilized the left colon in a lateral to medial fashion off the line of Toldt up towards the splenic flexure to ensure good mobilization of the left colon to reach into the pelvis.  We then made a small Pfannenstiel incision. An [place] wound protector was placed.  The distal rectum was brought out through the [facility] wound protector. Identified the tumor and it appeared to be close to the distal margin. I resected the previously dissected colon across a pursestring device.  A 29 mm EEA anvil was placed and the pursestring was tied tightly around this. This was then placed back into the abdomen. The spleen was removed at this point. I decided to take an additional distal margin. The robot was  re-docked. I used the vessel sealer to dissect the mesentery approximately 1 cm distal.  Once this was cleared, 2 more green load staplers were used to transect the rectum. This was brought out of the abdomen and a stitch marked the distal portion of the margin. It was sent to pathology for further examination with the specimen. A 19 [place] Blake drain was placed into the pelvis. The EEA stapler was introduced into the rectum and an anastomosis was created under robotic visualization. The donuts were intact. There was no tension on the anastomosis. There was no leak when tested with insufflation under water. I inspected the remainder of the abdomen. There was no hemostatic difficulties. The omentum was then brought down over the abdominal contents and the ports an [place] were removed.  We then switched to clean gowns, gloves, instruments and drapes. The 11 mm port site was closed using a 0 Vicryl suture. The peritoneum of the Pfannenstiel incision was closed using a running 2-0 Vicryl suture. The fascia was closed using interrupted Novafil sutures. The subcutaneous tissue was closed using a running 0 Vicryl suture. The skin of the Pfannenstiel incision was closed using a 4-0 Vicryl running subcuticular suture. 4-0 Vicryl suture was also used to  close the port sites. Dermabond was placed over the port sites and a sterile dressing was placed over the extraction site. The drain was sutured into place with a 2-0 nylon suture. The patient was then awakened from anesthesia and sent to the postanesthesia care unit in stable condition. All counts were correct per operating room staff.

## 2017-02-18 NOTE — Anesthesia Postprocedure Evaluation (Signed)
Anesthesia Post Note  Patient: Allison Dunlap  Procedure(s) Performed: Procedure(s) (LRB): XI ROBOTIC ASSISTED LOWER ANTERIOR RESECTION WITH SPLENIC FLEXURE MOBILIZATION (N/A) XI ROBOTIC ASSISTED SPLENECTOMY INTRAOPERATIVE ASSESSMENT OF VASCULAR PERFUSION     Patient location during evaluation: PACU Anesthesia Type: General Level of consciousness: awake and alert, patient cooperative and oriented Pain management: pain level controlled Vital Signs Assessment: post-procedure vital signs reviewed and stable Respiratory status: spontaneous breathing, nonlabored ventilation, respiratory function stable and patient connected to nasal cannula oxygen Cardiovascular status: blood pressure returned to baseline and stable Postop Assessment: no signs of nausea or vomiting Anesthetic complications: no    Last Vitals:  Vitals:   02/18/17 1431 02/18/17 1447  BP: 116/70 126/77  Pulse: 69   Resp: 12   Temp: (!) 36.4 C 36.8 C    Last Pain:  Vitals:   02/18/17 1445  TempSrc:   PainSc: 2                  Danon Lograsso,E. Marcayla Budge

## 2017-02-18 NOTE — H&P (Signed)
The patient is a 66 year old female who presents with colorectal cancer. 66 year old female with recent change in bowel habits as well as some rectal bleeding who underwent a colonoscopy by Dr. Hilarie Fredrickson. This showed a proximal rectal mass from approximately 10-15 cm from anal verge. Biopsies show invasive adenocarcinoma. CT scan show no signs of metastatic disease but enlarged perirectal lymph nodes. She has completed radiation but did have trouble with bleeding the chemotherapy portion of her treatment due to thrombocytopenia. She has tolerated radiation rather well with minimal symptoms. We were asked to evaluate for possible splenectomy as well to help her platelets come up to a level that she can receive adjuvant chemotherapy.   Problem List/Past Medical Leighton Ruff, MD; 07/28/5724 3:24 PM) RECTAL CANCER (C20)   Past Surgical History Leighton Ruff, MD; 2/0/3559 3:24 PM) No pertinent past surgical history   Diagnostic Studies History Leighton Ruff, MD; 01/28/1637 3:24 PM) Colonoscopy  within last year Mammogram  1-3 years ago Pap Smear  1-5 years ago  Allergies Jerrye Bushy, [place]; 12/26/2016 2:26 PM) No Known Drug Allergies 11/11/2016 Allergies Reconciled   Medication History Leighton Ruff, MD; 10/30/3644 3:24 PM) Vitamin D (1000UNIT Tablet, Oral) Active. Cyanocobalamin (1000MCG Tablet, Oral) Active. Multivitamin Adult (Oral) Active. Medications Reconciled Neomycin Sulfate (500MG Tablet, 2 (two) Tablet Oral SEE NOTE, Taken starting 12/26/2016) Active. (TAKE TWO TABLETS AT 2 PM, 3 PM, AND 10 PM THE DAY PRIOR TO SURGERY) Flagyl (500MG Tablet, 2 (two) Tablet Oral SEE NOTE, Taken starting 12/26/2016) Active. (Take at 2pm, 3pm, and 10pm the day prior to your colon operation)  Social History Leighton Ruff, MD; 8/0/3212 3:24 PM) Alcohol use  Moderate alcohol use. Caffeine use  Coffee. Illicit drug use  Remotely quit drug use. Tobacco use  Former  smoker.  Family History Leighton Ruff, MD; 08/31/8248 3:24 PM) Hypertension  Sister.  Pregnancy / Birth History Leighton Ruff, MD; 0/09/[birth year]46 3:24 PM) Age at menarche  78 years. Age of menopause  75-55 Gravida  0 Irregular periods  Para  0  Other Problems Leighton Ruff, MD; 03/04/9168 3:24 PM) Ulcerative Colitis     Review of Systems General Not Present- Appetite Loss, Chills, Fatigue, Fever, Night Sweats, Weight Gain and Weight Loss. Skin Not Present- Change in Wart/Mole, Dryness, Hives, Jaundice, New Lesions, Non-Healing Wounds, Rash and Ulcer. HEENT Present- Wears glasses/contact lenses. Not Present- Earache, Hearing Loss, Hoarseness, Nose Bleed, Oral Ulcers, Ringing in the Ears, Seasonal Allergies, Sinus Pain, Sore Throat, Visual Disturbances and Yellow Eyes. Respiratory Not Present- Bloody sputum, Chronic Cough, Difficulty Breathing, Snoring and Wheezing. Cardiovascular Not Present- Chest Pain, Difficulty Breathing Lying Down, Leg Cramps, Palpitations, Rapid Heart Rate, Shortness of Breath and Swelling of Extremities. Gastrointestinal Present- Bloody Stool and Change in Bowel Habits. Not Present- Abdominal Pain, Bloating, Chronic diarrhea, Constipation, Difficulty Swallowing, Excessive gas, Gets full quickly at meals, Hemorrhoids, Indigestion, Nausea, Rectal Pain and Vomiting. Female Genitourinary Not Present- Frequency, Nocturia, Painful Urination, Pelvic Pain and Urgency. Musculoskeletal Not Present- Back Pain, Joint Pain, Joint Stiffness, Muscle Pain, Muscle Weakness and Swelling of Extremities. Neurological Not Present- Decreased Memory, Fainting, Headaches, Numbness, Seizures, Tingling, Tremor, Trouble walking and Weakness. Psychiatric Not Present- Anxiety, Bipolar, Change in Sleep Pattern, Depression, Fearful and Frequent crying. Endocrine Present- Hair Changes. Not Present- Cold Intolerance, Excessive Hunger, Heat Intolerance, Hot flashes and New  Diabetes. Hematology Not Present- Blood Thinners, Easy Bruising, Excessive bleeding, Gland problems, HIV and Persistent Infections.  BP 133/75   Pulse 80   Temp 97.6 F (36.4 C) (Oral)  Resp 16   Ht 4' 11"  (1.499 m)   Wt 53.3 kg (117 lb 9.6 oz)   SpO2 99%   BMI 23.75 kg/m     Physical Exam General Mental Status-Alert. General Appearance-Not in acute distress. Build & Nutrition-Well nourished. Posture-Normal posture. Gait-Normal.  Head and Neck Head-normocephalic, atraumatic with no lesions or palpable masses. Trachea-midline.  Chest and Lung Exam Chest and lung exam reveals -on auscultation, normal breath sounds, no adventitious sounds and normal vocal resonance.  Cardiovascular Cardiovascular examination reveals -normal heart sounds, regular rate and rhythm with no murmurs and no digital clubbing, cyanosis, edema, increased warmth or tenderness.  Abdomen Inspection Inspection of the abdomen reveals - No Hernias. Palpation/Percussion Palpation and Percussion of the abdomen reveal - Soft, Non Tender, No Rigidity (guarding), No hepatosplenomegaly and No Palpable abdominal masses.  Rectal Note: Palpable mass at least 8 cm   Neurologic Neurologic evaluation reveals -alert and oriented x 3 with no impairment of recent or remote memory, normal attention span and ability to concentrate, normal sensation and normal coordination.  Musculoskeletal Normal Exam - Bilateral-Upper Extremity Strength Normal and Lower Extremity Strength Normal.    Assessment & Plan Leighton Ruff MD;  RECTAL CANCER (C20) Impression: 66 year old female with advanced stage mid rectal cancer status post radiation and chemotherapy. We will plan on doing her low anterior resection in late July. We discussed the possibility of needing a diverting ostomy but I think it is likely that we will be able to avoid this and save the distal portion of her rectum. Her  oncologist is asked [place] to discuss splenectomy with her. I will have her see my partner Dr. Johney Maine to determine if this is something she would like to pursue to assist with getting adjuvant chemotherapy postoperatively. The surgery and anatomy were described to the patient as well as the risks of surgery and the possible complications. These include: Bleeding, deep abdominal infections and possible wound complications such as hernia and infection, damage to adjacent structures, leak of surgical connections, which can lead to other surgeries and possibly an ostomy, possible need for other procedures, such as abscess drains in radiology, possible prolonged hospital stay, possible diarrhea from removal of part of the colon, possible constipation from narcotics, possible bowel, bladder or sexual dysfunction if having rectal surgery, prolonged fatigue/weakness or appetite loss, possible early recurrence of of disease, possible complications of their medical problems such as heart disease or arrhythmias or lung problems, death (less than 1%). I believe the patient understands and wishes to proceed with the surgery.

## 2017-02-18 NOTE — Anesthesia Procedure Notes (Signed)
Arterial Line Insertion Start/End7/25/2018 7:45 AM Performed by: Annye Asa, Wiletta Bermingham E, CRNA  Preanesthetic checklist: patient identified, IV checked, site marked, risks and benefits discussed, surgical consent, monitors and equipment checked, pre-op evaluation, timeout performed and anesthesia consent Lidocaine 1% used for infiltration and patient sedated Right, radial was placed Catheter size: 20 G Hand hygiene performed  and Seldinger technique used  Attempts: 2 (First attempt by SRNA unsuccessful) Procedure performed without using ultrasound guided technique. Ultrasound Notes:anatomy identified, needle tip was noted to be adjacent to the nerve/plexus identified and no ultrasound evidence of intravascular and/or intraneural injection Following insertion, dressing applied and Biopatch. Post procedure assessment: normal  Patient tolerated the procedure well with no immediate complications.

## 2017-02-18 NOTE — Anesthesia Procedure Notes (Signed)
Procedure Name: Intubation Date/Time: 02/18/2017 8:41 AM Performed by: Lissa Morales Pre-anesthesia Checklist: Patient identified, Emergency Drugs available, Suction available and Patient being monitored Patient Re-evaluated:Patient Re-evaluated prior to induction Oxygen Delivery Method: Circle system utilized Preoxygenation: Pre-oxygenation with 100% oxygen Induction Type: IV induction Ventilation: Mask ventilation without difficulty Laryngoscope Size: 3 and Glidescope Grade View: Grade III Tube type: Oral Tube size: 7.0 mm Number of attempts: 2 Airway Equipment and Method: Stylet and Video-laryngoscopy Placement Confirmation: ETT inserted through vocal cords under direct vision,  positive ETCO2 and breath sounds checked- equal and bilateral Secured at: 22 cm Tube secured with: Tape Dental Injury: Teeth and Oropharynx as per pre-operative assessment  Comments: Difficult intubation.  Grade III view only of epiglottis by SRNA, confirmed by attending.

## 2017-02-18 NOTE — Progress Notes (Signed)
Patient arrived to 1529 via stretcher from PACU.  Honeycomb with 3 lap sites and JP drain to R side noted.  Foley secured to left thigh.  Patient states pain is 2/10 from movement. Family to be called.

## 2017-02-18 NOTE — Transfer of Care (Signed)
Immediate Anesthesia Transfer of Care Note  Patient: Allison Dunlap  Procedure(s) Performed: Procedure(s): XI ROBOTIC ASSISTED LOWER ANTERIOR RESECTION WITH SPLENIC FLEXURE MOBILIZATION (N/A) XI ROBOTIC ASSISTED SPLENECTOMY INTRAOPERATIVE ASSESSMENT OF VASCULAR PERFUSION  Patient Location: PACU  Anesthesia Type:General  Level of Consciousness: awake, alert , oriented and patient cooperative  Airway & Oxygen Therapy: Patient Spontanous Breathing and Patient connected to face mask oxygen  Post-op Assessment: Report given to RN, Post -op Vital signs reviewed and stable and Patient moving all extremities X 4  Post vital signs: stable  Last Vitals:  Vitals:   02/18/17 0524  BP: 133/75  Pulse: 80  Resp: 16  Temp: 36.4 C    Last Pain:  Vitals:   02/18/17 0524  TempSrc: Oral      Patients Stated Pain Goal: 3 (29/29/09 0301)  Complications: No apparent anesthesia complications

## 2017-02-18 NOTE — Op Note (Signed)
02/18/2017  PATIENT:  Allison Dunlap  66 y.o. female  Patient Care Team: Patient, No Pcp Per as PCP - General (General Practice) Pyrtle, Lajuan Lines, MD as Consulting Physician (Gastroenterology) Leighton Ruff, MD as Consulting Physician (General Surgery) Kyung Rudd, MD as Consulting Physician (Radiation Oncology) Truitt Merle, MD as Consulting Physician (Hematology)  PRE-OPERATIVE DIAGNOSIS:     ITP with recurrent thrombocytopenia.  Steroid resistant.  Rectal cancer with need of chemotherapy.  Thrombocytopenia on chemotherapy.  POST-OPERATIVE DIAGNOSIS:   ITP with recurrent thrombocytopenia.  Steroid resistant.  Rectal cancer with need of chemotherapy.  Thrombocytopenia on chemotherapy.  PROCEDURE:  ROBOTIC SPLENECTOMY  SURGEON:  Adin Hector, MD, FACS.  ASSISTANT: Leighton Ruff, MD, FACS.   ANESTHESIA:    General with endotracheal intubation Local anesthetic as a field block  EBL:  (See Anesthesia Intraoperative Record) Total I/O In: 3000 [I.V.:3000] Out: 605 [Urine:455; Blood:150]  Delay start of Pharmacological VTE agent (>24hrs) due to surgical blood loss or risk of bleeding:  no  DRAINS: 19 Fr Blake closed bulb drain in the pelvis  SPECIMEN: SPLEEN    DISPOSITION OF SPECIMEN:  PATHOLOGY  COUNTS:  YES  PLAN OF CARE: Admit to inpatient   PATIENT DISPOSITION:  PACU - hemodynamically stable.  INDICATION:  Pleasant patient with ITP.  Mild/moderate thrombocytopenia.  Has rectal cancer.  Received neoadjuvant chemotherapy.  Had severe thrombocytopenia.  Steroid resistant.  Has required immunoglobulin numerous times.  Strong suspicion for need for post adjuvant chemotherapy.  Strongly recommended by Dunlap hematologist/oncologist to consider splenectomy to avoid delaying or not tolerating chemotherapy.  Long discussion with patient and colleagues.  Preoperative immunizations done for meningococcus, pneumococcus, Haemophilus influenza.  She is ready to proceed.  Plan to do a  conjunction with Dunlap low anterior rectal cancer resection.  See Dr. Manon Hilding note for separate robotic resection done immediately after splenectomy  The anatomy and physiology of the spleen was discussed.  Pathophysiology of the disease was discussed.  Options were discussed, and I made a recommendation to remove the spleen to help treat the pathology.  Minimally invasive & open techniques discussed.  Risks of bleeding, infection, injury to other organs, reoperation, death, and other risks were discussed.   I noted a good likelihood this will help address the problem.  While there are risks, I feel the risks of nonoperative management are greater; therefore, I feel surgery offers the best option. Educational material was available.  We will work to minimize complications.    OR FINDINGS: Average size spleen.  Evidence of any accessory spleens or splenules.  DESCRIPTION:   The patient was identified & brought in the operating room. The patient was positioned supine with arms tucked. SCDs were active during the entire case. The patient underwent general anesthesia without any difficulty.  The abdomen was prepped and draped in a sterile fashion. A Surgical Timeout confirmed our plan. Dr. Marcello Moores placed robotic ports in anticipation of the robotic low anterior resection.  Please see Dunlap note for details.  Patient positioned in reverse Trendelenburg.  The robot carefully docked.  I proceeded with splenectomy first.  I turned attention to the left upper quadrant.  I went into the lesser sac along the proximal greater curvature the stomach and moved proximally to transect the short gastric vessels off the spleen.  I freed the attachments between colon and spleen and a superior to inferior fashion.  I mobilized the spleen from an inferior to spare fashion and somewhat lateral to medial.  I came  towards an inferior splenic arterial pedicle.  I dissect around this.  After getting a critical view I ligated and  transected that with a vessel sealer.  I continued dissection more proximally.  Came to the more dominant central splenic vascular pedicle.  Able to elevate the spleen anteriorly.  We carefully dissected around this dominant pedicle after further mobilization of the superior pole of the spleen.  End up taking the main splenic vascular pedicles with robotic firing staple ease with white load vascular staplers 2 firings.  Freed off the remaining attachments laterally and superiorly.  Spleen was placed into a large Endo Catch sac.  Patient was repositioned and redocked for robotic low anterior resection done by Dr. Marcello Moores while I assisted.  I transitioned to assisting Dunlap for the mobilization of the splenic flexure, colon, low anterior rectosigmoid resection, rectal stump resection for better distal margin, rigid proctoscopy, closure.  Please note that at the time of rectosigmoid extraction, the spleen was removed intact inside the back near the end of the case.  See Dr. Marcello Moores' separate operative report for details.     Adin Hector, M.D., F.A.C.S. Gastrointestinal and Minimally Invasive Surgery [facility] [address], [place], [place] [address] [phone] Main / Paging  02/18/2017 12:42 PM

## 2017-02-19 ENCOUNTER — Encounter (HOSPITAL_COMMUNITY): Payer: Self-pay | Admitting: General Surgery

## 2017-02-19 LAB — CBC
HCT: 31.4 % — ABNORMAL LOW (ref 36.0–46.0)
HEMOGLOBIN: 10.8 g/dL — AB (ref 12.0–15.0)
MCH: 30.7 pg (ref 26.0–34.0)
MCHC: 34.4 g/dL (ref 30.0–36.0)
MCV: 89.2 fL (ref 78.0–100.0)
Platelets: 105 10*3/uL — ABNORMAL LOW (ref 150–400)
RBC: 3.52 MIL/uL — AB (ref 3.87–5.11)
RDW: 14 % (ref 11.5–15.5)
WBC: 7 10*3/uL (ref 4.0–10.5)

## 2017-02-19 LAB — BASIC METABOLIC PANEL
Anion gap: 6 (ref 5–15)
BUN: 6 mg/dL (ref 6–20)
CO2: 24 mmol/L (ref 22–32)
Calcium: 8.3 mg/dL — ABNORMAL LOW (ref 8.9–10.3)
Chloride: 108 mmol/L (ref 101–111)
Creatinine, Ser: 0.59 mg/dL (ref 0.44–1.00)
GFR calc Af Amer: >60 mL/min (ref >60)
GFR calc non Af Amer: >60 mL/min (ref >60)
Glucose, Bld: 116 mg/dL — ABNORMAL HIGH (ref 65–99)
Potassium: 3.9 mmol/L (ref 3.5–5.1)
Sodium: 138 mmol/L (ref 135–145)

## 2017-02-19 LAB — BPAM PLATELET PHERESIS
BLOOD PRODUCT EXPIRATION DATE: 201807272359
Blood Product Expiration Date: 201807272359
ISSUE DATE / TIME: 201807250733
UNIT TYPE AND RH: 8400
Unit Type and Rh: 9500

## 2017-02-19 LAB — PREPARE PLATELET PHERESIS
UNIT DIVISION: 0
Unit division: 0

## 2017-02-19 LAB — HEMOGLOBIN AND HEMATOCRIT, BLOOD
HCT: 35.7 % — ABNORMAL LOW (ref 36.0–46.0)
HEMOGLOBIN: 11.8 g/dL — AB (ref 12.0–15.0)

## 2017-02-19 NOTE — Evaluation (Signed)
Physical Therapy Evaluation-1x Patient Details Name: Allison Dunlap MRN: [id number] DOB: May 18, 1951 Today's Date: 02/19/2017   History of Present Illness  66 yo female with colorectal cancer. S/P lower abdominal resection, splenectomy 7/25.   Clinical Impression  On eval, pt was sup-mod ind with mobility. She walked ~500 feet around unit. No acute PT needs. 1x eval. Will sign off. Encouraged pt to ambulate often at her leisure.     Follow Up Recommendations No PT follow up    Equipment Recommendations  None recommended by PT    Recommendations for Other Services       Precautions / Restrictions Precautions Precautions: None Precaution Comments: abd surgery, jp drain Restrictions Weight Bearing Restrictions: No      Mobility  Bed Mobility Overal bed mobility: Modified Independent Bed Mobility: Supine to Sit;Sit to Supine     Supine to sit: HOB elevated;Modified independent (Device/Increase time) Sit to supine: HOB elevated;Modified independent (Device/Increase time)      Transfers Overall transfer level: Modified independent                  Ambulation/Gait Ambulation/Gait assistance: Supervision Ambulation Distance (Feet): 500 Feet Assistive device: None Gait Pattern/deviations: WFL(Within Functional Limits)     General Gait Details: good gait speed.   Stairs            Wheelchair Mobility    Modified Rankin (Stroke Patients Only)       Balance                                             Pertinent Vitals/Pain Pain Assessment: 0-10 Pain Score: 2  Pain Location: abdomen Pain Descriptors / Indicators: Sore Pain Intervention(s): Monitored during session;Ice applied    Home Living Family/patient expects to be discharged to:: Private residence Living Arrangements: Alone Available Help at Discharge: Family;Friend(s) Type of Home: House Home Access: Stairs to enter Entrance Stairs-Rails: Right Entrance Stairs-Number  of Steps: 7 Home Layout: One level Home Equipment: None      Prior Function Level of Independence: Independent               Hand Dominance        Extremity/Trunk Assessment   Upper Extremity Assessment Upper Extremity Assessment: Overall WFL for tasks assessed    Lower Extremity Assessment Lower Extremity Assessment: Overall WFL for tasks assessed    Cervical / Trunk Assessment Cervical / Trunk Assessment: Normal  Communication   Communication: No difficulties  Cognition Arousal/Alertness: Awake/alert Behavior During Therapy: WFL for tasks assessed/performed Overall Cognitive Status: Within Functional Limits for tasks assessed                                        General Comments      Exercises     Assessment/Plan    PT Assessment Patent does not need any further PT services  PT Problem List         PT Treatment Interventions      PT Goals (Current goals can be found in the Care Plan section)  Acute Rehab PT Goals Patient Stated Goal: home soon. regain plof. PT Goal Formulation: All assessment and education complete, DC therapy    Frequency     Barriers to discharge  Co-evaluation               AM-PAC PT "6 Clicks" Daily Activity  Outcome Measure Difficulty turning over in bed (including adjusting bedclothes, sheets and blankets)?: None Difficulty moving from lying on back to sitting on the side of the bed? : None Difficulty sitting down on and standing up from a chair with arms (e.g., wheelchair, bedside commode, etc,.)?: None Help needed moving to and from a bed to chair (including a wheelchair)?: None Help needed walking in hospital room?: None Help needed climbing 3-5 steps with a railing? : None 6 Click Score: 24    End of Session   Activity Tolerance: Patient tolerated treatment well Patient left: in bed;with call bell/phone within reach;with family/visitor present   PT Visit Diagnosis: Difficulty in  walking, not elsewhere classified (R26.2)    Time: 0350-0938 PT Time Calculation (min) (ACUTE ONLY): 12 min   Charges:   PT Evaluation $PT Eval Low Complexity: 1 Procedure     PT G Codes:          Weston Anna, MPT Pager: [phone]

## 2017-02-19 NOTE — Progress Notes (Signed)
Checked freq. Rested quietly at long intervals with eyes closed, resp even and unlabored. Awakens easily to verbal stimuli. JP drain patent with 40 ml emptied at this time. Foley catheter remains patent with  700 ml clear, yellow urine discarded.

## 2017-02-19 NOTE — Progress Notes (Signed)
1 Day Post-Op Robotic splenectomy and LAR Subjective: Doing well.  No nausea.  Pain controlled  Objective: Vital signs in last 24 hours: Temp:  [97.5 F (36.4 C)-98.5 F (36.9 C)] 98.5 F (36.9 C) (07/26 0535) Pulse Rate:  [60-81] 63 (07/26 0535) Resp:  [10-16] 16 (07/26 0535) BP: (105-146)/(64-86) 110/72 (07/26 0535) SpO2:  [98 %-100 %] 100 % (07/26 0535) Arterial Line BP: (119-141)/(60-81) 119/61 (07/25 1400)   Intake/Output from previous day: 07/25 0701 - 07/26 0700 In: 5640 [P.O.:120; I.V.:4950] Out: 4128 [NOMVE:7209; Drains:450; Blood:150] Intake/Output this shift: No intake/output data recorded.   General appearance: alert and cooperative GI: normal findings: soft, non-tender Non-distended Incision: no significant drainage  Lab Results:   Recent Labs  02/18/17 1327 02/19/17 0536  WBC 13.3* 7.0  HGB 11.2* 10.8*  HCT 33.5* 31.4*  PLT 58* 105*   BMET  Recent Labs  02/19/17 0536  NA 138  K 3.9  CL 108  CO2 24  GLUCOSE 116*  BUN 6  CREATININE 0.59  CALCIUM 8.3*   PT/INR No results for input(s): LABPROT, INR in the last 72 hours. ABG No results for input(s): PHART, HCO3 in the last 72 hours.  Invalid input(s): PCO2, PO2  MEDS, Scheduled . acetaminophen  1,000 mg Oral Q6H  . alvimopan  12 mg Oral BID  . enoxaparin (LOVENOX) injection  40 mg Subcutaneous Q24H  . gabapentin  300 mg Oral BID    Studies/Results: No results found.  Assessment: s/p Procedure(s): XI ROBOTIC ASSISTED LOWER ANTERIOR RESECTION WITH SPLENIC FLEXURE MOBILIZATION XI ROBOTIC ASSISTED SPLENECTOMY INTRAOPERATIVE ASSESSMENT OF VASCULAR PERFUSION Patient Active Problem List   Diagnosis Date Noted  . Thrombocytopenia due to ITP & chemotherapy s/p splenectomy 02/18/2017 02/18/2017  . Chronic ITP (idiopathic thrombocytopenia) (HCC) 12/15/2016  . Rectal cancer ([facility]) 10/21/2016    Expected post op course  Plan: Advance diet to clears and then FLD as  tolerated Ambulate Decrease MIV    LOS: 1 day     .Rosario Adie, [facility], [place]   02/19/2017 7:23 AM

## 2017-02-19 NOTE — Progress Notes (Signed)
Dr. Marcello Moores aware via phone that pt walked recently and toward end of walk had large, loose BM. Pt reported feeling lightheaded with dry heaves at the time. Currently resting in bed. VSS. Denies nausea. Pt and sister reassured. NT reported emptying 150 ml of drainage from JP while pt up earlier. Order received to obtain H&H now.

## 2017-02-19 NOTE — Care Management Note (Signed)
Case Management Note  Patient Details  Name: Destony Prevost MRN: [id number] Date of Birth: 05-18-1951  Subjective/Objective:      Rectal ca rectal resection             Action/Plan: Date:  February 19 2017  Chart reviewed for concurrent status and case management needs.  Will continue to follow patient progress.  Discharge Planning: following for needs  Expected discharge date: [id number]  Velva Harman, BSN, [place], [place]   Expected Discharge Date:                  Expected Discharge Plan:  Home/Self Care  In-House Referral:     Discharge planning Services  CM Consult  Post Acute Care Choice:    Choice offered to:     DME Arranged:    DME Agency:     HH Arranged:    HH Agency:     Status of Service:  In process, will continue to follow  If discussed at Long Length of Stay Meetings, dates discussed:    Additional Comments:  Leeroy Cha, RN 02/19/2017, 9:44 AM

## 2017-02-20 LAB — BASIC METABOLIC PANEL
Anion gap: 6 (ref 5–15)
BUN: 6 mg/dL (ref 6–20)
CO2: 25 mmol/L (ref 22–32)
Calcium: 8.4 mg/dL — ABNORMAL LOW (ref 8.9–10.3)
Chloride: 109 mmol/L (ref 101–111)
Creatinine, Ser: 0.58 mg/dL (ref 0.44–1.00)
Glucose, Bld: 105 mg/dL — ABNORMAL HIGH (ref 65–99)
POTASSIUM: 3.8 mmol/L (ref 3.5–5.1)
SODIUM: 140 mmol/L (ref 135–145)

## 2017-02-20 LAB — CBC
HCT: 35.3 % — ABNORMAL LOW (ref 36.0–46.0)
HEMOGLOBIN: 11.7 g/dL — AB (ref 12.0–15.0)
MCH: 30.9 pg (ref 26.0–34.0)
MCHC: 33.1 g/dL (ref 30.0–36.0)
MCV: 93.1 fL (ref 78.0–100.0)
Platelets: 200 10*3/uL (ref 150–400)
RBC: 3.79 MIL/uL — AB (ref 3.87–5.11)
RDW: 14.4 % (ref 11.5–15.5)
WBC: 10.7 10*3/uL — ABNORMAL HIGH (ref 4.0–10.5)

## 2017-02-20 NOTE — Progress Notes (Signed)
2 Days Post-Op Robotic splenectomy and LAR Subjective: Having more nausea this morning.  Vomited last night.  Having loose stools  Objective: Vital signs in last 24 hours: Temp:  [97.8 F (36.6 C)-99.1 F (37.3 C)] 98.3 F (36.8 C) (07/27 0528) Pulse Rate:  [63-110] 66 (07/27 0528) Resp:  [14-16] 14 (07/27 0528) BP: (97-126)/(62-78) 126/68 (07/27 0528) SpO2:  [98 %-100 %] 99 % (07/27 0528)   Intake/Output from previous day: 07/26 0701 - 07/27 0700 In: 1367.9 [P.O.:360; I.V.:1007.9] Out: 3150 [Urine:2300; Drains:850] Intake/Output this shift: Total I/O In: 60 [P.O.:60] Out: -    General appearance: alert and cooperative GI: normal findings: soft, non-tender Non-distended Incision: no significant drainage  Lab Results:   Recent Labs  02/19/17 0536 02/19/17 1539 02/20/17 0457  WBC 7.0  --  10.7*  HGB 10.8* 11.8* 11.7*  HCT 31.4* 35.7* 35.3*  PLT 105*  --  200   BMET  Recent Labs  02/19/17 0536 02/20/17 0457  NA 138 140  K 3.9 3.8  CL 108 109  CO2 24 25  GLUCOSE 116* 105*  BUN 6 6  CREATININE 0.59 0.58  CALCIUM 8.3* 8.4*   PT/INR No results for input(s): LABPROT, INR in the last 72 hours. ABG No results for input(s): PHART, HCO3 in the last 72 hours.  Invalid input(s): PCO2, PO2  MEDS, Scheduled . acetaminophen  1,000 mg Oral Q6H  . enoxaparin (LOVENOX) injection  40 mg Subcutaneous Q24H    Studies/Results: No results found.  Assessment: s/p Procedure(s): XI ROBOTIC ASSISTED LOWER ANTERIOR RESECTION WITH SPLENIC FLEXURE MOBILIZATION XI ROBOTIC ASSISTED SPLENECTOMY INTRAOPERATIVE ASSESSMENT OF VASCULAR PERFUSION Patient Active Problem List   Diagnosis Date Noted  . Thrombocytopenia due to ITP & chemotherapy s/p splenectomy 02/18/2017 02/18/2017  . Chronic ITP (idiopathic thrombocytopenia) (HCC) 12/15/2016  . Rectal cancer ([facility]) 10/21/2016      Plan: Min PO clears today until nausea better Ambulate Cont MIV    LOS: 2 days      .Rosario Adie, [facility], [place]   02/20/2017 10:40 AM

## 2017-02-20 NOTE — Progress Notes (Signed)
Pharmacy Brief Note - Alvimopan (Entereg)  The standing order set for alvimopan (Entereg) now includes an automatic order to discontinue the drug after the patient has had a bowel movement. The change was approved by the [place] and the Medical Executive Committee.   This patient has had bowel movements documented by nursing. Therefore, alvimopan has been discontinued. If there are questions, please contact the pharmacy at [phone].   Thank you-  Dia Sitter, PharmD, BCPS 02/20/2017 10:36 AM

## 2017-02-21 LAB — CBC
HCT: 35.4 % — ABNORMAL LOW (ref 36.0–46.0)
HEMOGLOBIN: 11.8 g/dL — AB (ref 12.0–15.0)
MCH: 31 pg (ref 26.0–34.0)
MCHC: 33.3 g/dL (ref 30.0–36.0)
MCV: 92.9 fL (ref 78.0–100.0)
PLATELETS: 295 10*3/uL (ref 150–400)
RBC: 3.81 MIL/uL — AB (ref 3.87–5.11)
RDW: 14.3 % (ref 11.5–15.5)
WBC: 8.6 10*3/uL (ref 4.0–10.5)

## 2017-02-21 LAB — BASIC METABOLIC PANEL
ANION GAP: 7 (ref 5–15)
BUN: 7 mg/dL (ref 6–20)
CALCIUM: 8.4 mg/dL — AB (ref 8.9–10.3)
CO2: 26 mmol/L (ref 22–32)
CREATININE: 0.64 mg/dL (ref 0.44–1.00)
Chloride: 106 mmol/L (ref 101–111)
Glucose, Bld: 105 mg/dL — ABNORMAL HIGH (ref 65–99)
Potassium: 3.9 mmol/L (ref 3.5–5.1)
Sodium: 139 mmol/L (ref 135–145)

## 2017-02-21 NOTE — Progress Notes (Signed)
Checked frequently, rested quietly at long intervals with  Eyes closed, resp even and unlabored. No acute distress noted. Uneventful night.

## 2017-02-21 NOTE — Progress Notes (Signed)
3 Days Post-Op   Subjective/Chief Complaint: No complaints. Feels better   Objective: Vital signs in last 24 hours: Temp:  [98.3 F (36.8 C)-99.2 F (37.3 C)] 98.5 F (36.9 C) (07/28 0542) Pulse Rate:  [67-71] 67 (07/28 0542) Resp:  [16] 16 (07/28 0542) BP: (119-130)/(70-80) 122/77 (07/28 0542) SpO2:  [99 %-100 %] 99 % (07/28 0542)    Intake/Output from previous day: 07/27 0701 - 07/28 0700 In: 1510.8 [P.O.:100; I.V.:1030.8] Out: 920 [Urine:435; Drains:485] Intake/Output this shift: No intake/output data recorded.  General appearance: alert and cooperative Resp: clear to auscultation bilaterally Cardio: regular rate and rhythm GI: soft, minimal tenderness. good bs  Lab Results:   Recent Labs  02/20/17 0457 02/21/17 0521  WBC 10.7* 8.6  HGB 11.7* 11.8*  HCT 35.3* 35.4*  PLT 200 295   BMET  Recent Labs  02/20/17 0457 02/21/17 0521  NA 140 139  K 3.8 3.9  CL 109 106  CO2 25 26  GLUCOSE 105* 105*  BUN 6 7  CREATININE 0.58 0.64  CALCIUM 8.4* 8.4*   PT/INR No results for input(s): LABPROT, INR in the last 72 hours. ABG No results for input(s): PHART, HCO3 in the last 72 hours.  Invalid input(s): PCO2, PO2  Studies/Results: No results found.  Anti-infectives: Anti-infectives    Start     Dose/Rate Route Frequency Ordered Stop   02/18/17 2100  cefoTEtan in Dextrose 5% (CEFOTAN) IVPB 2 g     2 g Intravenous Every 12 hours 02/18/17 1511 02/18/17 2115   02/18/17 1000  cefoTEtan in Dextrose 5% (CEFOTAN) IVPB 2 g  Status:  Discontinued     2 g Intravenous Every 12 hours 02/18/17 0541 02/18/17 1511   02/18/17 0702  cefoTEtan in Dextrose 5% (CEFOTAN) 2-2.08 GM-% IVPB    Comments:  Harle Stanford   : cabinet override      02/18/17 0702 02/18/17 0847   02/18/17 0532  cefoTEtan (CEFOTAN) 2 g in dextrose 5 % 50 mL IVPB  Status:  Discontinued     2 g 100 mL/hr over 30 Minutes Intravenous On call to O.R. 02/18/17 0532 02/18/17 0541       Assessment/Plan: s/p Procedure(s): XI ROBOTIC ASSISTED LOWER ANTERIOR RESECTION WITH SPLENIC FLEXURE MOBILIZATION (N/A) XI ROBOTIC ASSISTED SPLENECTOMY INTRAOPERATIVE ASSESSMENT OF VASCULAR PERFUSION Advance diet. Start fulls today Ambulate Ileus improving POD3  LOS: 3 days    TOTH III,Shoshanah Dapper S 02/21/2017

## 2017-02-21 NOTE — Progress Notes (Signed)
Pt reported not feeling well, still feeling feverish and chills. Vitals taken; all WNL. Denies lightheadedness, dizziness, chest pain, SOB.  Pt states room temp is comfortable for now. Encouraged IS, rest, and fluids. Will continue to monitor. Pt agreed to contact nurse if symptoms worsen or change.

## 2017-02-22 MED ORDER — IBUPROFEN 200 MG PO TABS
600.0000 mg | ORAL_TABLET | Freq: Four times a day (QID) | ORAL | Status: DC | PRN
  Administered 2017-02-22 – 2017-02-24 (×5): 600 mg via ORAL
  Filled 2017-02-22 (×6): qty 3

## 2017-02-22 NOTE — Progress Notes (Signed)
4 Days Post-Op   Subjective/Chief Complaint: Doesn't feel as well today. Had some nausea and low grade fever overnight. No vomiting. Did have a large bm during the night as well   Objective: Vital signs in last 24 hours: Temp:  [97.9 F (36.6 C)-100.3 F (37.9 C)] 98.2 F (36.8 C) (07/29 3709) Pulse Rate:  [68-78] 74 (07/29 0608) Resp:  [16] 16 (07/29 0608) BP: (111-137)/(64-91) 134/91 (07/29 0608) SpO2:  [97 %-100 %] 97 % (07/29 0608) Last BM Date: 02/22/17  Intake/Output from previous day: 07/28 0701 - 07/29 0700 In: 2560.8 [P.O.:900; I.V.:1660.8] Out: 3430 [Urine:2900; Drains:530] Intake/Output this shift: Total I/O In: 0  Out: 460 [Urine:400; Drains:60]  General appearance: alert and cooperative Resp: clear to auscultation bilaterally Cardio: regular rate and rhythm GI: soft, minimal tenderness. good bs. incisions ok. drain output serosanguinous  Lab Results:   Recent Labs  02/20/17 0457 02/21/17 0521  WBC 10.7* 8.6  HGB 11.7* 11.8*  HCT 35.3* 35.4*  PLT 200 295   BMET  Recent Labs  02/20/17 0457 02/21/17 0521  NA 140 139  K 3.8 3.9  CL 109 106  CO2 25 26  GLUCOSE 105* 105*  BUN 6 7  CREATININE 0.58 0.64  CALCIUM 8.4* 8.4*   PT/INR No results for input(s): LABPROT, INR in the last 72 hours. ABG No results for input(s): PHART, HCO3 in the last 72 hours.  Invalid input(s): PCO2, PO2  Studies/Results: No results found.  Anti-infectives: Anti-infectives    Start     Dose/Rate Route Frequency Ordered Stop   02/18/17 2100  cefoTEtan in Dextrose 5% (CEFOTAN) IVPB 2 g     2 g Intravenous Every 12 hours 02/18/17 1511 02/18/17 2115   02/18/17 1000  cefoTEtan in Dextrose 5% (CEFOTAN) IVPB 2 g  Status:  Discontinued     2 g Intravenous Every 12 hours 02/18/17 0541 02/18/17 1511   02/18/17 0702  cefoTEtan in Dextrose 5% (CEFOTAN) 2-2.08 GM-% IVPB    Comments:  Harle Stanford   : cabinet override      02/18/17 0702 02/18/17 0847   02/18/17 0532   cefoTEtan (CEFOTAN) 2 g in dextrose 5 % 50 mL IVPB  Status:  Discontinued     2 g 100 mL/hr over 30 Minutes Intravenous On call to O.R. 02/18/17 0532 02/18/17 0541      Assessment/Plan: s/p Procedure(s): XI ROBOTIC ASSISTED LOWER ANTERIOR RESECTION WITH SPLENIC FLEXURE MOBILIZATION (N/A) XI ROBOTIC ASSISTED SPLENECTOMY INTRAOPERATIVE ASSESSMENT OF VASCULAR PERFUSION Stay on full liquids today until bowel function returns better  Ambulate POD 4  LOS: 4 days    TOTH III,Tyler Cubit S 02/22/2017

## 2017-02-22 NOTE — Progress Notes (Signed)
Late entry. Pt requesting ibuprofen to help manage pain. Paged Dr. Marlou Starks.  Received callback from Dr. Marlou Starks. Relayed above. VORB for 600 mg ibuprofen PO prn q6 hours for pain. Order placed.   Pt notified of plan and agreed.

## 2017-02-23 MED ORDER — TRAMADOL HCL 50 MG PO TABS: 50.0000 mg | ORAL_TABLET | Freq: Four times a day (QID) | ORAL | Status: DC | PRN

## 2017-02-23 NOTE — Care Management Important Message (Signed)
Important Message  Patient Details  Name: Tobey Lippard MRN: [id number] Date of Birth: 11-15-1950   Medicare Important Message Given:  Yes    Kerin Salen 02/23/2017, 10:40 AMImportant Message  Patient Details  Name: Elonna Mcfarlane MRN: [id number] Date of Birth: 1951/07/24   Medicare Important Message Given:  Yes    Kerin Salen 02/23/2017, 10:39 AM

## 2017-02-23 NOTE — Progress Notes (Signed)
5 Days Post-Op Robotic splenectomy and LAR Subjective: Feeling better this morning.  Having loose stools  Objective: Vital signs in last 24 hours: Temp:  [98.5 F (36.9 C)-99.2 F (37.3 C)] 98.5 F (36.9 C) (07/30 0558) Pulse Rate:  [67-75] 67 (07/30 0558) Resp:  [16] 16 (07/30 0558) BP: (124-129)/(72-89) 124/76 (07/30 0558) SpO2:  [98 %-99 %] 99 % (07/30 0558)   Intake/Output from previous day: 07/29 0701 - 07/30 0700 In: 1920 [P.O.:720; I.V.:1200] Out: 3582 [Urine:1830; Drains:575] Intake/Output this shift: Total I/O In: -  Out: 170 [Urine:100; Drains:70]   General appearance: alert and cooperative GI: normal findings: soft, non-tender mildly-distended Incision: no significant drainage  Lab Results:   Recent Labs  02/21/17 0521  WBC 8.6  HGB 11.8*  HCT 35.4*  PLT 295   BMET  Recent Labs  02/21/17 0521  NA 139  K 3.9  CL 106  CO2 26  GLUCOSE 105*  BUN 7  CREATININE 0.64  CALCIUM 8.4*   PT/INR No results for input(s): LABPROT, INR in the last 72 hours. ABG No results for input(s): PHART, HCO3 in the last 72 hours.  Invalid input(s): PCO2, PO2  MEDS, Scheduled . acetaminophen  1,000 mg Oral Q6H  . enoxaparin (LOVENOX) injection  40 mg Subcutaneous Q24H    Studies/Results: No results found.  Assessment: s/p Procedure(s): XI ROBOTIC ASSISTED LOWER ANTERIOR RESECTION WITH SPLENIC FLEXURE MOBILIZATION XI ROBOTIC ASSISTED SPLENECTOMY INTRAOPERATIVE ASSESSMENT OF VASCULAR PERFUSION Patient Active Problem List   Diagnosis Date Noted  . Thrombocytopenia due to ITP & chemotherapy s/p splenectomy 02/18/2017 02/18/2017  . Chronic ITP (idiopathic thrombocytopenia) (HCC) 12/15/2016  . Rectal cancer ([place]) 10/21/2016      Plan: Soft diet PO pain meds Ambulate SL MIV D/C JP Anticipate d/c in AM    LOS: 5 days     .Rosario Adie, [facility], [place]   02/23/2017 8:17 AM

## 2017-02-24 MED ORDER — IBUPROFEN 200 MG PO TABS: 200.0000 mg | ORAL_TABLET | Freq: Four times a day (QID) | ORAL | Status: DC | PRN

## 2017-02-24 NOTE — Discharge Instructions (Signed)

## 2017-02-24 NOTE — Discharge Summary (Signed)
Physician Discharge Summary  Patient ID: Allison Dunlap MRN: [id number] DOB/AGE: 12-08-1950 66 y.o.  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  SHe developed an ileus.  Once this resolved, her diet was advanced.  By POD 6 she was tolerating a diet and having bowel function.  Her pain was controlled with Ibf.  Consults: None  Significant Diagnostic Studies: labs: cbc, chemistry   Treatments: IV hydration, analgesia: acetaminophen w/ codeine and surgery: Robotic LAR, splenectomy  Discharge Exam: Blood pressure 120/83, pulse 71, temperature 98.2 F (36.8 C), temperature source Oral, resp. rate 16, height 4' 11"  (1.499 m), weight 53.3 kg (117 lb 9.6 oz), SpO2 96 %. General appearance: alert and cooperative GI: normal findings: soft, non-tender Incision/Wound:clean, dry, intact  Disposition: Final discharge disposition not confirmed   Allergies as of 02/24/2017   No Known Allergies     Medication List    TAKE these medications   cholecalciferol 1000 units tablet Commonly known as:  VITAMIN D Take 1 tablet by mouth daily.   ibuprofen 200 MG tablet Commonly known as:  ADVIL,MOTRIN Take 1-2 tablets (200-400 mg total) by mouth every 6 (six) hours as needed (for pain).   OCUVITE PO Take 1 tablet by mouth daily.   vitamin B-12 1000 MCG tablet Commonly known as:  CYANOCOBALAMIN Take 1 tablet by mouth daily.      Follow-up Information    Leighton Ruff, MD. Schedule an appointment as soon as possible for a visit in 2 week(s).   Specialty:  General Surgery Contact information: [address] [place] [address] [phone]            Signed: Rosario Adie 3/66/2947, 6:54 AM

## 2017-02-24 NOTE — Progress Notes (Signed)
Discharge and medication instructions reviewed with patient. Questions answered and patient denies further questions. No prescriptions given to patient. Patient's sister is coming to her patient home. Donne Hazel, RN

## 2017-03-03 ENCOUNTER — Ambulatory Visit: Payer: Self-pay | Admitting: Radiation Oncology

## 2017-03-03 NOTE — Progress Notes (Signed)
[place]  [phone] [phone]  Clinic Follow Up Note   Patient Care Team: Patient, No Pcp Per as PCP - General (General Practice) Pyrtle, Lajuan Lines, MD as Consulting Physician (Gastroenterology) Leighton Ruff, MD as Consulting Physician (General Surgery) Kyung Rudd, MD as Consulting Physician (Radiation Oncology) Truitt Merle, MD as Consulting Physician (Hematology) Michael Boston, MD as Consulting Physician (General Surgery) 03/04/2017   CHIEF COMPLAINTS/PURPOSE OF CONSULTATION:  Follow up of diagnosed rectosigmoid cancer   Oncology History   Cancer Staging Rectal cancer [facility]) Staging form: Colon and Rectum, AJCC 8th Edition - Clinical stage from 10/14/2016: Stage IIIB (cT3, cN1, cM0) - Signed by Truitt Merle, MD on 10/24/2016       Rectal cancer ([place])   10/14/2016 Initial Diagnosis    Rectal cancer ([place])      10/14/2016 Procedure    Colonoscopy by Dr. Hilarie Fredrickson showed a fungating partially upchucking large mass in the rectosigmoid colon, the mass begins about 10 cm from the dentate line and measured 5 cm in length. Biopsy was obtained. Multiple small and largemouth diverticula were found in the sigmoid colon and the ascending colon.      10/14/2016 Initial Biopsy    Rectosigmoid mass biopsy showed adenocarcinoma       10/15/2016 Imaging    CT of chest, abdomen and pelvis with contrast showed a 4 cm annular colonic soft tissue mass near the rectosigmoid junction, consistent with known primary carcinoma. Several small her chronic lymph nodes measuring up to 40m, suspicious for local lymph node metastasis. No evidence of distant metastasis.      11/17/2016 - 12/25/2016 Radiation Therapy     Radiation Therapy by Dr. MLisbeth Renshaw     11/17/2016 - 12/08/2016 Chemotherapy    Concurrent chemo Xeloda (15071min am and 100011mn evening) to go along with radiation.   Hold chemo due to platelet count 12/08/16      02/18/2017 Surgery    XI ROBOTIC ASSISTED  LOWER ANTERIOR RESECTION WITH SPLENIC FLEXURE MOBILIZATION and INTRAOPERATIVE ASSESSMENT OF VASCULAR PERFUSION by Dr. ThoMarcello Moores   02/18/2017 Pathology Results    Diagnosis 02/18/17 1. Spleen - BENIGN SPLEEN (152 G) - NO MALIGNANCY IDENTIFIED 2. Colon, segmental resection for tumor, rectosigmoid - RESIDUAL ADENOCARCINOMA, MODERATELY DIFFERENTIATED - MARGINS UNINVOLVED BY CARCINOMA - NO CARCINOMA IDENTIFIED IN TEN LYMPH NODES (0/10) - SEE ONCOLOGY TABLE AND COMMENT BELOW 3. Rectum, resection, distal stump - BENIGN COLONIC MUCOSA - NO CARCINOMA IDENTIFIED IN ONE LYMPH NODE (0/1) 4. Colon, resection margin (donut), final distal - BENIGN COLONIC MUCOSA - NO CARCINOMA IDENTIFIED        Chemotherapy    Xeloda 6 pills once a day, 2 weeks on and 1 week off. Starting 03/18/17        HISTORY OF PRESENTING ILLNESS:  Allison Dunlap 66o. female is here because of Her recently diagnosed rectosigmoid colon cancer. She was referred by her gastroenterologist Dr. PyrHilarie Fredricksonhe presents to my clinic by herself today.  She has been having constipationa and diarrhea for 6 months, and daily hematochezia, a tea spoon each time, no rectal pain, no bloating, nause or other symptoms, no change of appetie and weight. She does not have a primary care physician, and [facility] She was seen by Dr. PyrHilarie Fredricksonhe underwent colonoscopy, which showed a large mass in the rectosigmoid junction. Biopsy showed adenocarcinoma.  She was diagnosed with ulcerative colitis in her 20's, resolved and now.  She  has had thrombocytopenia since 2013, she has bone marrow biopsy, which was normal per pt, never required any treatment    She is widowed, no children, has one brother in [place], she is not working   CURRENT THERAPY: Xeloda 2 weeks on and 1 weeks off, starting 03/18/17  INTERVAL HISTORY:  Allison Dunlap returns today for follow up. She presents to the clinic today post surgery  reporting it went well. She says she is getting better after treatment. The incision seem to have healed well. She will see Dr. Marcello Moores next week. Her BM is loose and it occurs 10-12 times a day.  She tries to be carefull with what she eats.  She plans to get back to her active lifestyle.     MEDICAL HISTORY:  Past Medical History:  Diagnosis Date  . Cancer ([place]) 10/14/2016   rectal cancer-adenocarcinoma  . Clotting disorder ([place])   . Temporary low platelet count ([place])     SURGICAL HISTORY: Past Surgical History:  Procedure Laterality Date  . COLONOSCOPY     with propofol   . INTRAOPERATIVE CHOLANGIOGRAM  02/18/2017   Procedure: INTRAOPERATIVE ASSESSMENT OF VASCULAR PERFUSION;  Surgeon: Leighton Ruff, MD;  Location: [facility];  Service: General;;  . LAPAROSCOPIC SPLENECTOMY  02/18/2017   Procedure: XI ROBOTIC ASSISTED SPLENECTOMY;  Surgeon: Michael Boston, MD;  Location: [facility];  Service: General;;  . NO PAST SURGERIES    . XI ROBOTIC ASSISTED LOWER ANTERIOR RESECTION N/A 02/18/2017   Procedure: XI ROBOTIC ASSISTED LOWER ANTERIOR RESECTION WITH SPLENIC FLEXURE MOBILIZATION;  Surgeon: Leighton Ruff, MD;  Location: [facility];  Service: General;  Laterality: N/A;    SOCIAL HISTORY: Social History   Social History  . Marital status: Widowed    Spouse name: N/A  . Number of children: 0  . Years of education: N/A   Occupational History  . retired    Social History Main Topics  . Smoking status: Former Smoker    Packs/day: 0.50    Years: 30.00    Quit date: 07/28/1997  . Smokeless tobacco: Never Used  . Alcohol use 4.2 oz/week    7 Glasses of wine per week     Comment: wine and beer  . Drug use: No  . Sexual activity: Not on file   Other Topics Concern  . Not on file   Social History Narrative  . No narrative on file    FAMILY HISTORY: Family History  Problem Relation Age of Onset  . Alzheimer's disease Mother   . Liver disease Father 25       failure  . Breast cancer  Sister   . Cancer Sister 47       breast cancer   . Heart disease Maternal Grandfather   . Cancer Paternal Grandmother        type unknown    ALLERGIES:  has No Known Allergies.  MEDICATIONS:  Current Outpatient Prescriptions  Medication Sig Dispense Refill  . cholecalciferol (VITAMIN D) 1000 units tablet Take 1 tablet by mouth daily.    Marland Kitchen ibuprofen (ADVIL,MOTRIN) 200 MG tablet Take 1-2 tablets (200-400 mg total) by mouth every 6 (six) hours as needed (for pain).    . Multiple Vitamins-Minerals (OCUVITE PO) Take 1 tablet by mouth daily.    . vitamin B-12 (CYANOCOBALAMIN) 1000 MCG tablet Take 1 tablet by mouth daily.     No current facility-administered medications for this visit.     REVIEW OF SYSTEMS:   Constitutional: Denies fevers, chills  or abnormal night sweats Eyes: Denies blurriness of vision, double vision or watery eyes Ears, nose, mouth, throat, and face: Denies mucositis or sore throat Respiratory: Denies cough, dyspnea or wheezes Cardiovascular: Denies palpitation, chest discomfort or lower extremity swelling Gastrointestinal:  Denies nausea, heartburn (+) frequent loose BM.  Skin: Lymphatics: Denies new lymphadenopathy or easy bruising Neurological:Denies numbness, tingling or new weaknesses Behavioral/Psych: Mood is stable, no new changes  All other systems were reviewed with the patient and are negative.  PHYSICAL EXAMINATION:  ECOG PERFORMANCE STATUS: 0  Vitals:   03/04/17 1054  BP: 133/76  Pulse: 68  Resp: 18  Temp: 98.4 F (36.9 C)   Filed Weights   03/04/17 1054  Weight: 116 lb 9.6 oz (52.9 kg)    GENERAL:alert, no distress and comfortable SKIN: skin color, texture, turgor are normal,  or significant lesions  EYES: normal, conjunctiva are pink and non-injected, sclera clear OROPHARYNX:no exudate, no erythema and lips, buccal mucosa, and tongue normal  NECK: supple, thyroid normal size, non-tender, without nodularity LYMPH:  no palpable  lymphadenopathy in the cervical, axillary or inguinal LUNGS: clear to auscultation and percussion with normal breathing effort HEART: regular rate & rhythm and no murmurs and no lower extremity edema ABDOMEN:abdomen soft, non-tender and normal bowel sounds (+) multiple small incision in abdomen and 3 cm incision below umbilical, all healing well.  Musculoskeletal:no cyanosis of digits and no clubbing  PSYCH: alert & oriented x 3 with fluent speech NEURO: no focal motor/sensory deficits  LABORATORY DATA:  I have reviewed the data as listed CBC Latest Ref Rng & Units 03/04/2017 02/21/2017 02/20/2017  WBC 3.9 - 10.3 10e3/uL 7.6 8.6 10.7(H)  Hemoglobin 11.6 - 15.9 g/dL 11.9 11.8(L) 11.7(L)  Hematocrit 34.8 - 46.6 % 36.5 35.4(L) 35.3(L)  Platelets 145 - 400 10e3/uL 629(H) 295 200   CMP Latest Ref Rng & Units 03/04/2017 02/21/2017 02/20/2017  Glucose 70 - 140 mg/dl 90 105(H) 105(H)  BUN 7.0 - 26.0 mg/dL 10.3 7 6   Creatinine 0.6 - 1.1 mg/dL 0.6 0.64 0.58  Sodium 136 - 145 mEq/L 141 139 140  Potassium 3.5 - 5.1 mEq/L 4.0 3.9 3.8  Chloride 101 - 111 mmol/L - 106 109  CO2 22 - 29 mEq/L 25 26 25   Calcium 8.4 - 10.4 mg/dL 9.1 8.4(L) 8.4(L)  Total Protein 6.4 - 8.3 g/dL 6.4 - -  Total Bilirubin 0.20 - 1.20 mg/dL 0.27 - -  Alkaline Phos 40 - 150 U/L 80 - -  AST 5 - 34 U/L 22 - -  ALT 0 - 55 U/L 28 - -   PATHOLOGY REPORT   Diagnosis 02/18/17 1. Spleen - BENIGN SPLEEN (152 G) - NO MALIGNANCY IDENTIFIED 2. Colon, segmental resection for tumor, rectosigmoid - RESIDUAL ADENOCARCINOMA, MODERATELY DIFFERENTIATED - MARGINS UNINVOLVED BY CARCINOMA - NO CARCINOMA IDENTIFIED IN TEN LYMPH NODES (0/10) - SEE ONCOLOGY TABLE AND COMMENT BELOW 3. Rectum, resection, distal stump - BENIGN COLONIC MUCOSA - NO CARCINOMA IDENTIFIED IN ONE LYMPH NODE (0/1) 4. Colon, resection margin (donut), final distal - BENIGN COLONIC MUCOSA - NO CARCINOMA IDENTIFIED Microscopic Comment 2. COLON AND RECTUM (INCLUDING  TRANS-ANAL RESECTION): Specimen: Rectosigmoid colon Procedure: Low anterior resection Tumor site: Rectum Specimen integrity: Intact Macroscopic intactness of mesorectum: Complete Macroscopic tumor perforation: Not identified Invasive tumor: Maximum size: 0.4 cm Histologic type(s): Adenocarcinoma Histologic grade and differentiation: G2 (moderately differentiated/low grade) Microscopic extension of invasive tumor: Tumor invades muscularis propria Lymph-Vascular invasion: Not identified Peri-neural invasion: Not identified Microscopic Comment(continued) Tumor deposit(s):  Not identified Resection margins: Uninvolved by carcinoma Distance closest margin: Distal (2 cm) Treatment effect: Single cells or rare small groups of cancer cells (near complete response, score 1) Lymph nodes: number examined 11; number positive: 0 (See also Part 3) Pathologic Staging: ypT2, ypN0 (see comment) Ancillary studies: Available upon request (Block 2C) Comment: There is a small fibrotic nodule with necrosis, calcifications and foreign body giant cell reaction which may represent treatment effect within a tumor nodule or lymph node.   Diagnosis 10/14/2016 Colon, biopsy, rectosigmoid tumor - ADENOCARCINOMA.  RADIOGRAPHIC STUDIES: I have personally reviewed the radiological images as listed and agreed with the findings in the report. No results found.   CT CAP W Contrast: 10/15/16 IMPRESSION: 4 cm annular colonic soft tissue mass near the rectosigmoid junction, consistent with known primary carcinoma. Several small pericolonic lymph nodes measuring up to 10 mm, suspicious for local lymph node metastases. No evidence of distant metastatic disease. Proximal sigmoid diverticulosis.  No evidence of diverticulitis.  Colonoscopy 10/14/2016 - The perianal exam findings include a perianal fungal rash. - The digital rectal exam was normal. - The terminal ileum appeared normal. - A fungating partially  obstructing large mass was found in the recto-sigmoid colon. The mass was circumferential. The mass begins about 10 cm from the dentate line and measured five cm in length. Oozing was present. Multiple biopsies were obtained with cold forceps for histology in a targeted manner. Area proximal to the tumor was tattooed on opposite walls with an injection of 3 mL total of Spot (carbon black). - Multiple small and large-mouthed diverticula were found in the sigmoid colon and descending colon. - The retroflexed view of the distal rectum and anal verge was normal and showed no anal or rectal abnormalities.  ASSESSMENT & PLAN: 66 y.o.  Caucasian female, with past medical history of ulcerative colitis, thrombocytopenia, presented with diarrhea, constipation, and persistent hematochezia.  1. Rectosigmoid adenocarcinoma, cT3N1M0, stage IIIB, ypT2N0M0, MSI-pending  -I previously reviewed his CT scan findings, colonoscopy and biopsy pathology results in great details with patient. -The tumor is located in the proximal rectum and rectosigmoid junction. -Prior CT scan showed several perirectal lymph nodes, suspicious for node metastasis. She likely has locally advanced disease. -I previously presented her case in our GI tumor Board. Due to the location of the cancer, Dr. Marcello Moores recommends neoadjuvant chemotherapy and radiation, followed by surgical resection. -Tumor Board feels her disease is likely T3 N1 based on CT scan findings, EUS or MRI for staging is probably not needed. 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 -She had a great response to radiation and chemotherapy, her residual tumor was T2, nodes were negative.   -We discussed the surgical pathology results in detail. -Given her excellent response to neoadjuvant radiation and  chemotherapy, she has minimal residual disease (near complete response), nodes negative, I recommend her to take adjuvant Xeloda for 4-5 months. I do not think she needs adjuvant FOLFOX or CAPOX.  -F/u in 2 weeks to start Xeloda she is recovering well from surgery. --I discussed the risk of cancer recurrence in the future. I discussed the surveillance plan, which is a physical exam and lab test (including CBC, CMP and CEA) every 3 months for the first 2 years, then every 6-12 months, colonoscopy in one year, and surveilliance CT scan every 6-12 month for  up to 5 year.   2. 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 This is most consistent with ITP.  --He developed severe some cytopenia after low-dose chemotherapy Xeloda which was held for her new adjuvant treatment. -She is now status post splenectomy, had a great response, her thrombopenia has resolved. -Plt counts 629 (03/04/17) after surgery, will continue to monitor.  -We discussed her mild thrombocytosis is related to splenectomy, no significant high risk for thrombosis. We'll continue monitoring.   3. History of ulcerative colitis -follow up with Dr. Hilarie Fredrickson  -We previously discussed her that her colitis may exacerbate during the radiation and chemotherapy. -She may use Imodium as needed for diarrhea, she'll follow-up with Dr. Hilarie Fredrickson if needed.  Plan -Order Xeloda 1575m bid, 14 days on and 7 days off for chemo therapy. She will start in 2 weeks after next visit. I'll send a prescription today. - lab and f/u in 2 weeks    No orders of the defined types were placed in this encounter.   All questions were answered. The patient knows to call the clinic with any problems, questions or concerns.  I spent 25 minutes counseling the patient face to face. The total time spent in the appointment was 30 minutes and  more than 50% was on counseling.  This document serves as a record of services personally performed by YTruitt Merle MD. It was created on her behalf by AJoslyn Devon a trained medical scribe. The creation of this record is based on the scribe's personal observations and the provider's statements to them. This document has been checked and approved by the attending provider.      FTruitt Merle MD 03/04/2017

## 2017-03-04 ENCOUNTER — Encounter: Payer: Self-pay | Admitting: Hematology

## 2017-03-04 ENCOUNTER — Ambulatory Visit (HOSPITAL_BASED_OUTPATIENT_CLINIC_OR_DEPARTMENT_OTHER): Payer: Medicare Other | Admitting: Hematology

## 2017-03-04 ENCOUNTER — Other Ambulatory Visit (HOSPITAL_BASED_OUTPATIENT_CLINIC_OR_DEPARTMENT_OTHER): Payer: Medicare Other

## 2017-03-04 ENCOUNTER — Telehealth: Payer: Self-pay | Admitting: Hematology

## 2017-03-04 VITALS — BP 133/76 | HR 68 | Temp 98.4°F | Resp 18 | Ht 59.0 in | Wt 116.6 lb

## 2017-03-04 DIAGNOSIS — C19 Malignant neoplasm of rectosigmoid junction: Secondary | ICD-10-CM

## 2017-03-04 DIAGNOSIS — D473 Essential (hemorrhagic) thrombocythemia: Secondary | ICD-10-CM

## 2017-03-04 DIAGNOSIS — C20 Malignant neoplasm of rectum: Secondary | ICD-10-CM

## 2017-03-04 DIAGNOSIS — Z9081 Acquired absence of spleen: Secondary | ICD-10-CM

## 2017-03-04 DIAGNOSIS — D693 Immune thrombocytopenic purpura: Secondary | ICD-10-CM | POA: Diagnosis not present

## 2017-03-04 LAB — CBC WITH DIFFERENTIAL/PLATELET
BASO%: 1.4 % (ref 0.0–2.0)
Basophils Absolute: 0.1 10e3/uL (ref 0.0–0.1)
EOS%: 5.1 % (ref 0.0–7.0)
Eosinophils Absolute: 0.4 10e3/uL (ref 0.0–0.5)
HCT: 36.5 % (ref 34.8–46.6)
HGB: 11.9 g/dL (ref 11.6–15.9)
LYMPH%: 15.7 % (ref 14.0–49.7)
MCH: 30.8 pg (ref 25.1–34.0)
MCHC: 32.6 g/dL (ref 31.5–36.0)
MCV: 94.6 fL (ref 79.5–101.0)
MONO#: 0.7 10e3/uL (ref 0.1–0.9)
MONO%: 9.6 % (ref 0.0–14.0)
NEUT#: 5.2 10e3/uL (ref 1.5–6.5)
NEUT%: 68.2 % (ref 38.4–76.8)
Platelets: 629 10e3/uL — ABNORMAL HIGH (ref 145–400)
RBC: 3.86 10e6/uL (ref 3.70–5.45)
RDW: 13.8 % (ref 11.2–14.5)
WBC: 7.6 10e3/uL (ref 3.9–10.3)
lymph#: 1.2 10e3/uL (ref 0.9–3.3)
nRBC: 0 % (ref 0–0)

## 2017-03-04 LAB — COMPREHENSIVE METABOLIC PANEL
ALT: 28 U/L (ref 0–55)
ANION GAP: 7 meq/L (ref 3–11)
AST: 22 U/L (ref 5–34)
Albumin: 2.8 g/dL — ABNORMAL LOW (ref 3.5–5.0)
Alkaline Phosphatase: 80 U/L (ref 40–150)
BILIRUBIN TOTAL: 0.27 mg/dL (ref 0.20–1.20)
BUN: 10.3 mg/dL (ref 7.0–26.0)
CO2: 25 meq/L (ref 22–29)
CREATININE: 0.6 mg/dL (ref 0.6–1.1)
Calcium: 9.1 mg/dL (ref 8.4–10.4)
Chloride: 109 mEq/L (ref 98–109)
EGFR: >90 mL/min/{1.73_m2} (ref >90)
Glucose: 90 mg/dl (ref 70–140)
Potassium: 4 mEq/L (ref 3.5–5.1)
Sodium: 141 mEq/L (ref 136–145)
TOTAL PROTEIN: 6.4 g/dL (ref 6.4–8.3)

## 2017-03-04 LAB — CEA (IN HOUSE-CHCC): CEA (CHCC-In House): 2 ng/mL (ref 0.00–5.00)

## 2017-03-04 MED ORDER — CAPECITABINE 500 MG PO TABS: 1000.0000 mg/m2 | ORAL_TABLET | Freq: Two times a day (BID) | ORAL | 5 refills | Status: DC

## 2017-03-04 NOTE — Telephone Encounter (Signed)
Scheduled appt per 8/8 los - Gave patient AVS and calender per los.

## 2017-03-08 ENCOUNTER — Other Ambulatory Visit: Payer: Self-pay | Admitting: Hematology

## 2017-03-12 ENCOUNTER — Telehealth: Payer: Self-pay

## 2017-03-12 NOTE — Telephone Encounter (Signed)
Jess,   Could you help on this? Thanks. This pt is not able to afford high copay.   Thanks much,  Truitt Merle MD

## 2017-03-12 NOTE — Telephone Encounter (Signed)
[facility] called asking if pt has secondary or supplemental insurance or medicare part B to help pay for the capecitabine. The number given is [phone] ext [phone]. This RN attempted to call and was unable to complete call.

## 2017-03-12 NOTE — Telephone Encounter (Signed)
[id number] tried to call again "your call cannot be completed at this time"

## 2017-03-18 ENCOUNTER — Telehealth: Payer: Self-pay | Admitting: Pharmacist

## 2017-03-18 DIAGNOSIS — C20 Malignant neoplasm of rectum: Secondary | ICD-10-CM

## 2017-03-18 MED ORDER — CAPECITABINE 500 MG PO TABS: 1000.0000 mg/m2 | ORAL_TABLET | Freq: Two times a day (BID) | ORAL | 5 refills | Status: DC

## 2017-03-18 NOTE — Telephone Encounter (Signed)
Oral Oncology Pharmacist Encounter  Received prescription for Xeloda for the adjuvant treatment of rectal cancer, planned duration 4-5 months.  Labs from 03/04/17 assessed, OK for treatment.  Current medication list in Epic reviewed, no significant DDIs with Xeloda identified identified.  Prescription has been e-scribed to the [facility] for benefits analysis and approval.  Copayment $95, patient is able to afford this copay for the duration of her adjuvant treatment. She will let us know if this changes in the future.  Patient will pick-up her Xeloda prescription from the pharmacy tomorrow 03/19/17 after office visit with Dr. Burr Medico.   I spoke with patient for overview of: Xeloda. Patient is familiar with medication due to neoadjuvant concurrent chemoradiation.  Counseled patient on administration, dosing, side effects, safe handling, and monitoring. Patient will take Xeloda 558m tablets, 3 tablets (15050m by mouth in AM and 3 tabs (150040mby mouth in PM, within 30 minutes of eating, for 14 days on, 7 days off. Patient states she has not been eating full meals, just small meals throughout the day, but will make sure she has something on her stomach prior to taking her Xeloda.  Side effects include but not limited to: GI upset, diarrhea, hand-foot syndrome, fatigue, and decreased blood counts.  Patient states she is still experiencing 12-15 BMs daily despite adding fiber to regimen at recommendation of Dr. ThoMarcello Moores/p lower anterior resection on 02/18/17). She does endorse stools are becoming more formed, however, she spends a lot of time in the bathroom daily. She questions many times if this is normal.  She will discuss these symptoms and Xeloda start date with Dr. FenBurr Medico office visit tomorrow. Patient is concerned about starting Xeloda while still experiencing these GI symptoms.     Reviewed with patient importance of keeping a medication schedule and plan for any  missed doses.  Allison Dunlap understanding and appreciation.   All questions answered.   Patient knows to call the office with questions or concerns. Oral Oncology Clinic will continue to follow.  Thank you,  JesJohny DrillingharmD, BCPS, BCOP 03/18/2017  11:21 AM Oral Oncology Clinic [phone]

## 2017-03-19 ENCOUNTER — Ambulatory Visit (HOSPITAL_BASED_OUTPATIENT_CLINIC_OR_DEPARTMENT_OTHER): Payer: Medicare Other | Admitting: Hematology

## 2017-03-19 ENCOUNTER — Other Ambulatory Visit (HOSPITAL_BASED_OUTPATIENT_CLINIC_OR_DEPARTMENT_OTHER): Payer: Medicare Other

## 2017-03-19 ENCOUNTER — Telehealth: Payer: Self-pay | Admitting: Hematology

## 2017-03-19 VITALS — BP 123/79 | HR 68 | Temp 98.5°F | Resp 20 | Ht 59.0 in | Wt 111.2 lb

## 2017-03-19 DIAGNOSIS — C19 Malignant neoplasm of rectosigmoid junction: Secondary | ICD-10-CM

## 2017-03-19 DIAGNOSIS — C20 Malignant neoplasm of rectum: Secondary | ICD-10-CM

## 2017-03-19 DIAGNOSIS — D693 Immune thrombocytopenic purpura: Secondary | ICD-10-CM

## 2017-03-19 DIAGNOSIS — D6959 Other secondary thrombocytopenia: Secondary | ICD-10-CM

## 2017-03-19 LAB — COMPREHENSIVE METABOLIC PANEL
ALT: 11 U/L (ref 0–55)
ANION GAP: 8 meq/L (ref 3–11)
AST: 17 U/L (ref 5–34)
Albumin: 3.5 g/dL (ref 3.5–5.0)
Alkaline Phosphatase: 107 U/L (ref 40–150)
BUN: 12 mg/dL (ref 7.0–26.0)
CALCIUM: 10 mg/dL (ref 8.4–10.4)
CHLORIDE: 104 meq/L (ref 98–109)
CO2: 28 meq/L (ref 22–29)
Creatinine: 0.7 mg/dL (ref 0.6–1.1)
EGFR: 87 mL/min/{1.73_m2} — ABNORMAL LOW (ref >90)
Glucose: 96 mg/dl (ref 70–140)
POTASSIUM: 4.1 meq/L (ref 3.5–5.1)
Sodium: 141 mEq/L (ref 136–145)
Total Bilirubin: 0.32 mg/dL (ref 0.20–1.20)
Total Protein: 7.4 g/dL (ref 6.4–8.3)

## 2017-03-19 LAB — CBC WITH DIFFERENTIAL/PLATELET
BASO%: 1.2 % (ref 0.0–2.0)
Basophils Absolute: 0.1 10*3/uL (ref 0.0–0.1)
EOS ABS: 0.2 10*3/uL (ref 0.0–0.5)
EOS%: 3.7 % (ref 0.0–7.0)
HEMATOCRIT: 39.4 % (ref 34.8–46.6)
HEMOGLOBIN: 12.7 g/dL (ref 11.6–15.9)
LYMPH%: 18.8 % (ref 14.0–49.7)
MCH: 30.7 pg (ref 25.1–34.0)
MCHC: 32.2 g/dL (ref 31.5–36.0)
MCV: 95.2 fL (ref 79.5–101.0)
MONO#: 0.7 10*3/uL (ref 0.1–0.9)
MONO%: 11.2 % (ref 0.0–14.0)
NEUT%: 65.1 % (ref 38.4–76.8)
NEUTROS ABS: 4.2 10*3/uL (ref 1.5–6.5)
PLATELETS: 389 10*3/uL (ref 145–400)
RBC: 4.14 10*6/uL (ref 3.70–5.45)
RDW: 12.9 % (ref 11.2–14.5)
WBC: 6.5 10*3/uL (ref 3.9–10.3)
lymph#: 1.2 10*3/uL (ref 0.9–3.3)

## 2017-03-19 MED FILL — XELODA 500 MG TABLET: 500 | 21 days supply | Qty: 84 | Fill #0

## 2017-03-19 NOTE — Progress Notes (Signed)
[place]  [phone] [phone]  Clinic Follow Up Note   Patient Care Team: Patient, No Pcp Per as PCP - General (General Practice) Pyrtle, Lajuan Lines, MD as Consulting Physician (Gastroenterology) Leighton Ruff, MD as Consulting Physician (General Surgery) Kyung Rudd, MD as Consulting Physician (Radiation Oncology) Truitt Merle, MD as Consulting Physician (Hematology) Michael Boston, MD as Consulting Physician (General Surgery) 03/19/2017   CHIEF COMPLAINTS/PURPOSE OF CONSULTATION:  Follow up of diagnosed rectosigmoid cancer   Oncology History   Cancer Staging Rectal cancer [facility]) Staging form: Colon and Rectum, AJCC 8th Edition - Clinical stage from 10/14/2016: Stage IIIB (cT3, cN1, cM0) - Signed by Truitt Merle, MD on 10/24/2016       Rectal cancer ([place])   10/14/2016 Initial Diagnosis    Rectal cancer ([place])      10/14/2016 Procedure    Colonoscopy by Dr. Hilarie Fredrickson showed a fungating partially upchucking large mass in the rectosigmoid colon, the mass begins about 10 cm from the dentate line and measured 5 cm in length. Biopsy was obtained. Multiple small and largemouth diverticula were found in the sigmoid colon and the ascending colon.      10/14/2016 Initial Biopsy    Rectosigmoid mass biopsy showed adenocarcinoma       10/15/2016 Imaging    CT of chest, abdomen and pelvis with contrast showed a 4 cm annular colonic soft tissue mass near the rectosigmoid junction, consistent with known primary carcinoma. Several small her chronic lymph nodes measuring up to 68m, suspicious for local lymph node metastasis. No evidence of distant metastasis.      11/17/2016 - 12/25/2016 Radiation Therapy     Radiation Therapy by Dr. MLisbeth Renshaw     11/17/2016 - 12/08/2016 Chemotherapy    Concurrent chemo Xeloda (15030min am and 100073mn evening) to go along with radiation.   Hold chemo due to platelet count 12/08/16      02/18/2017 Surgery    XI ROBOTIC ASSISTED  LOWER ANTERIOR RESECTION WITH SPLENIC FLEXURE MOBILIZATION and INTRAOPERATIVE ASSESSMENT OF VASCULAR PERFUSION by Dr. ThoMarcello Moores   02/18/2017 Pathology Results    Diagnosis 02/18/17 1. Spleen - BENIGN SPLEEN (152 G) - NO MALIGNANCY IDENTIFIED 2. Colon, segmental resection for tumor, rectosigmoid - RESIDUAL ADENOCARCINOMA, MODERATELY DIFFERENTIATED - MARGINS UNINVOLVED BY CARCINOMA - NO CARCINOMA IDENTIFIED IN TEN LYMPH NODES (0/10) - SEE ONCOLOGY TABLE AND COMMENT BELOW 3. Rectum, resection, distal stump - BENIGN COLONIC MUCOSA - NO CARCINOMA IDENTIFIED IN ONE LYMPH NODE (0/1) 4. Colon, resection margin (donut), final distal - BENIGN COLONIC MUCOSA - NO CARCINOMA IDENTIFIED        Chemotherapy    Xeloda 6 pills once a day, 2 weeks on and 1 week off. Starting 03/18/17        HISTORY OF PRESENTING ILLNESS:  Allison Dunlap 66. female is here because of Her recently diagnosed rectosigmoid colon cancer. She was referred by her gastroenterologist Dr. PyrHilarie Fredricksonhe presents to my clinic by herself today.  She has been having constipationa and diarrhea for 6 months, and daily hematochezia, a tea spoon each time, no rectal pain, no bloating, nause or other symptoms, no change of appetie and weight. She does not have a primary care physician, and [facility] She was seen by Dr. PyrHilarie Fredricksonhe underwent colonoscopy, which showed a large mass in the rectosigmoid junction. Biopsy showed adenocarcinoma.  She was diagnosed with ulcerative colitis in her 20's, resolved and now.  She  has had thrombocytopenia since 2013, she has bone marrow biopsy, which was normal per pt, never required any treatment    She is widowed, no children, has one brother in [place], she is not working   CURRENT THERAPY: Xeloda 2 weeks on and 1 weeks off, starting 03/19/17  INTERVAL HISTORY:  Jennilyn Esteve returns today for follow up. She has been doing well and starts Xeloda today.  She is concerned about her bowel movements not being normal before starting. Her incision has healed well and she denies any pain.   MEDICAL HISTORY:  Past Medical History:  Diagnosis Date  . Cancer ([place]) 10/14/2016   rectal cancer-adenocarcinoma  . Clotting disorder ([place])   . Temporary low platelet count ([place])     SURGICAL HISTORY: Past Surgical History:  Procedure Laterality Date  . COLONOSCOPY     with propofol   . INTRAOPERATIVE CHOLANGIOGRAM  02/18/2017   Procedure: INTRAOPERATIVE ASSESSMENT OF VASCULAR PERFUSION;  Surgeon: Leighton Ruff, MD;  Location: [facility];  Service: General;;  . LAPAROSCOPIC SPLENECTOMY  02/18/2017   Procedure: XI ROBOTIC ASSISTED SPLENECTOMY;  Surgeon: Michael Boston, MD;  Location: [facility];  Service: General;;  . NO PAST SURGERIES    . XI ROBOTIC ASSISTED LOWER ANTERIOR RESECTION N/A 02/18/2017   Procedure: XI ROBOTIC ASSISTED LOWER ANTERIOR RESECTION WITH SPLENIC FLEXURE MOBILIZATION;  Surgeon: Leighton Ruff, MD;  Location: [facility];  Service: General;  Laterality: N/A;    SOCIAL HISTORY: Social History   Social History  . Marital status: Widowed    Spouse name: N/A  . Number of children: 0  . Years of education: N/A   Occupational History  . retired    Social History Main Topics  . Smoking status: Former Smoker    Packs/day: 0.50    Years: 30.00    Quit date: 07/28/1997  . Smokeless tobacco: Never Used  . Alcohol use 4.2 oz/week    7 Glasses of wine per week     Comment: wine and beer  . Drug use: No  . Sexual activity: Not on file   Other Topics Concern  . Not on file   Social History Narrative  . No narrative on file    FAMILY HISTORY: Family History  Problem Relation Age of Onset  . Alzheimer's disease Mother   . Liver disease Father 88       failure  . Breast cancer Sister   . Cancer Sister 66       breast cancer   . Heart disease Maternal Grandfather   . Cancer Paternal Grandmother        type unknown    ALLERGIES:   has No Known Allergies.  MEDICATIONS:  Current Outpatient Prescriptions  Medication Sig Dispense Refill  . cholecalciferol (VITAMIN D) 1000 units tablet Take 1 tablet by mouth daily.    . Multiple Vitamins-Minerals (OCUVITE PO) Take 1 tablet by mouth daily.    . capecitabine (XELODA) 500 MG tablet Take 3 tablets (1,500 mg total) by mouth 2 (two) times daily after a meal. Take for 14 days on, 7 days off 84 tablet 5  . ibuprofen (ADVIL,MOTRIN) 200 MG tablet Take 1-2 tablets (200-400 mg total) by mouth every 6 (six) hours as needed (for pain). (Patient not taking: Reported on 03/19/2017)    . vitamin B-12 (CYANOCOBALAMIN) 1000 MCG tablet Take 1 tablet by mouth daily.     No current facility-administered medications for this visit.     REVIEW OF SYSTEMS:   Constitutional:  Denies fevers, chills or abnormal night sweats Eyes: Denies blurriness of vision, double vision or watery eyes Ears, nose, mouth, throat, and face: Denies mucositis or sore throat Respiratory: Denies cough, dyspnea or wheezes Cardiovascular: Denies palpitation, chest discomfort or lower extremity swelling Gastrointestinal:  Denies nausea, heartburn (+) frequent diarrhea .  Skin: Lymphatics: Denies new lymphadenopathy or easy bruising Neurological:Denies numbness, tingling or new weaknesses Behavioral/Psych: Mood is stable, no new changes  All other systems were reviewed with the patient and are negative.  PHYSICAL EXAMINATION:  ECOG PERFORMANCE STATUS: 0  Vitals:   03/19/17 1413  BP: 123/79  Pulse: 68  Resp: 20  Temp: 98.5 F (36.9 C)  SpO2: 99%   Filed Weights   03/19/17 1413  Weight: 111 lb 3.2 oz (50.4 kg)    GENERAL:alert, no distress and comfortable SKIN: skin color, texture, turgor are normal,  or significant lesions  EYES: normal, conjunctiva are pink and non-injected, sclera clear OROPHARYNX:no exudate, no erythema and lips, buccal mucosa, and tongue normal  NECK: supple, thyroid normal size,  non-tender, without nodularity LYMPH:  no palpable lymphadenopathy in the cervical, axillary or inguinal LUNGS: clear to auscultation and percussion with normal breathing effort HEART: regular rate & rhythm and no murmurs and no lower extremity edema ABDOMEN:abdomen soft, non-tender and normal bowel sounds (+) multiple small incision in abdomen and 3 cm incision below umbilical, all healing well.  Musculoskeletal:no cyanosis of digits and no clubbing  PSYCH: alert & oriented x 3 with fluent speech NEURO: no focal motor/sensory deficits  LABORATORY DATA:  I have reviewed the data as listed CBC Latest Ref Rng & Units 03/19/2017 03/04/2017 02/21/2017  WBC 3.9 - 10.3 10e3/uL 6.5 7.6 8.6  Hemoglobin 11.6 - 15.9 g/dL 12.7 11.9 11.8(L)  Hematocrit 34.8 - 46.6 % 39.4 36.5 35.4(L)  Platelets 145 - 400 10e3/uL 389 629(H) 295   CMP Latest Ref Rng & Units 03/19/2017 03/04/2017 02/21/2017  Glucose 70 - 140 mg/dl 96 90 105(H)  BUN 7.0 - 26.0 mg/dL 12.0 10.3 7  Creatinine 0.6 - 1.1 mg/dL 0.7 0.6 0.64  Sodium 136 - 145 mEq/L 141 141 139  Potassium 3.5 - 5.1 mEq/L 4.1 4.0 3.9  Chloride 101 - 111 mmol/L - - 106  CO2 22 - 29 mEq/L 28 25 26   Calcium 8.4 - 10.4 mg/dL 10.0 9.1 8.4(L)  Total Protein 6.4 - 8.3 g/dL 7.4 6.4 -  Total Bilirubin 0.20 - 1.20 mg/dL 0.32 0.27 -  Alkaline Phos 40 - 150 U/L 107 80 -  AST 5 - 34 U/L 17 22 -  ALT 0 - 55 U/L 11 28 -   PATHOLOGY REPORT   Diagnosis 02/18/17 1. Spleen - BENIGN SPLEEN (152 G) - NO MALIGNANCY IDENTIFIED 2. Colon, segmental resection for tumor, rectosigmoid - RESIDUAL ADENOCARCINOMA, MODERATELY DIFFERENTIATED - MARGINS UNINVOLVED BY CARCINOMA - NO CARCINOMA IDENTIFIED IN TEN LYMPH NODES (0/10) - SEE ONCOLOGY TABLE AND COMMENT BELOW 3. Rectum, resection, distal stump - BENIGN COLONIC MUCOSA - NO CARCINOMA IDENTIFIED IN ONE LYMPH NODE (0/1) 4. Colon, resection margin (donut), final distal - BENIGN COLONIC MUCOSA - NO CARCINOMA IDENTIFIED Microscopic  Comment 2. COLON AND RECTUM (INCLUDING TRANS-ANAL RESECTION): Specimen: Rectosigmoid colon Procedure: Low anterior resection Tumor site: Rectum Specimen integrity: Intact Macroscopic intactness of mesorectum: Complete Macroscopic tumor perforation: Not identified Invasive tumor: Maximum size: 0.4 cm Histologic type(s): Adenocarcinoma Histologic grade and differentiation: G2 (moderately differentiated/low grade) Microscopic extension of invasive tumor: Tumor invades muscularis propria Lymph-Vascular invasion: Not identified Peri-neural invasion:  Not identified Microscopic Comment(continued) Tumor deposit(s): Not identified Resection margins: Uninvolved by carcinoma Distance closest margin: Distal (2 cm) Treatment effect: Single cells or rare small groups of cancer cells (near complete response, score 1) Lymph nodes: number examined 11; number positive: 0 (See also Part 3) Pathologic Staging: ypT2, ypN0 (see comment) Ancillary studies: Available upon request (Block 2C) Comment: There is a small fibrotic nodule with necrosis, calcifications and foreign body giant cell reaction which may represent treatment effect within a tumor nodule or lymph node.   Diagnosis 10/14/2016 Colon, biopsy, rectosigmoid tumor - ADENOCARCINOMA.  RADIOGRAPHIC STUDIES: I have personally reviewed the radiological images as listed and agreed with the findings in the report. No results found.   CT CAP W Contrast: 10/15/16 IMPRESSION: 4 cm annular colonic soft tissue mass near the rectosigmoid junction, consistent with known primary carcinoma. Several small pericolonic lymph nodes measuring up to 10 mm, suspicious for local lymph node metastases. No evidence of distant metastatic disease. Proximal sigmoid diverticulosis.  No evidence of diverticulitis.  Colonoscopy 10/14/2016 - The perianal exam findings include a perianal fungal rash. - The digital rectal exam was normal. - The terminal ileum  appeared normal. - A fungating partially obstructing large mass was found in the recto-sigmoid colon. The mass was circumferential. The mass begins about 10 cm from the dentate line and measured five cm in length. Oozing was present. Multiple biopsies were obtained with cold forceps for histology in a targeted manner. Area proximal to the tumor was tattooed on opposite walls with an injection of 3 mL total of Spot (carbon black). - Multiple small and large-mouthed diverticula were found in the sigmoid colon and descending colon. - The retroflexed view of the distal rectum and anal verge was normal and showed no anal or rectal abnormalities.  ASSESSMENT & PLAN: 66 y.o.  Caucasian female, with past medical history of ulcerative colitis, thrombocytopenia, presented with diarrhea, constipation, and persistent hematochezia.  1. Rectosigmoid adenocarcinoma, cT3N1M0, stage IIIB, ypT2N0M0, MSI-pending  -I previously reviewed his CT scan findings, colonoscopy and biopsy pathology results in great details with patient. -The tumor is located in the proximal rectum and rectosigmoid junction. -Prior CT scan showed several perirectal lymph nodes, suspicious for node metastasis. She likely has locally advanced disease. -I previously presented her case in our GI tumor Board. Due to the location of the cancer, Dr. Marcello Moores recommends neoadjuvant chemotherapy and radiation, followed by surgical resection. -Tumor Board feels her disease is likely T3 N1 based on CT scan findings, EUS or MRI for staging is probably not needed. 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 -She had a great response to radiation and chemotherapy, her residual tumor was T2, nodes were negative.   -We previously discussed the surgical pathology results in  detail. -Given her excellent response to neoadjuvant radiation and chemotherapy, she has minimal residual disease (near complete response), nodes negative, I previously  recommend her to take adjuvant Xeloda for 4-5 months. I do not think she needs adjuvant FOLFOX or CAPOX.  --I previously discussed the risk of cancer recurrence in the future. I discussed the surveillance plan, which is a physical exam and lab test (including CBC, CMP and CEA) every 3 months for the first 2 years, then every 6-12 months, colonoscopy in one year, and surveilliance CT scan every 6-12 month for up to 5  year.  -  I again discussed the side effects of Xeloda, she will start on 8/27 starting with 2 in morning and 2 in evening for the first few days then up dose to 3 in morning/3 in evening - she can take one Senokot a day to help with bowel movements  2. 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 This is most consistent with ITP.  --He developed severe some cytopenia after low-dose chemotherapy Xeloda which was held for her new adjuvant treatment. -She is now status post splenectomy, had a great response, her thrombopenia has resolved. -Plt counts 629 (03/04/17) after surgery, will continue to monitor.  -We discussed her mild thrombocytosis is related to splenectomy, no significant high risk for thrombosis. We'll continue monitoring.   3. History of ulcerative colitis -follow up with Dr. Hilarie Fredrickson  -We previously discussed her that her colitis may exacerbate during the radiation and chemotherapy. -She may use Imodium as needed for diarrhea, she'll follow-up with Dr. Hilarie Fredrickson if needed.  Plan - Start Xeloda 8/27 with 2 tab (512m tab )in morning/2 in evening for first few days then up dosage to 3 in morning/3 in evening - f/u in 3 weeks - talking with financial office   No orders of the  defined types were placed in this encounter.   All questions were answered. The patient knows to call the clinic with any problems, questions or concerns.  I spent 15 minutes counseling the patient face to face. The total time spent in the appointment was 20 minutes and more than 50% was on counseling.  This document serves as a record of services personally performed by YTruitt Merle MD. It was created on her behalf by TBrandt Loosen a trained medical scribe. The creation of this record is based on the scribe's personal observations and the provider's statements to them. This document has been checked and approved by the attending provider.      FTruitt Merle MD 03/19/2017

## 2017-03-19 NOTE — Telephone Encounter (Signed)
Gave avs and calendar for September appointments.

## 2017-03-21 ENCOUNTER — Encounter: Payer: Self-pay | Admitting: Hematology

## 2017-04-06 NOTE — Progress Notes (Signed)
[place]  [phone] Fax:([phone]) [phone]  Clinic Follow Up Note   Patient Care Team: Patient, No Pcp Per as PCP - General (General Practice) Dunlap, Allison Lines, MD as Consulting Physician (Gastroenterology) Allison Ruff, MD as Consulting Physician (General Surgery) Allison Rudd, MD as Consulting Physician (Radiation Oncology) Allison Merle, MD as Consulting Physician (Hematology) Allison Boston, MD as Consulting Physician (General Surgery) 04/09/2017   CHIEF COMPLAINTS/PURPOSE OF CONSULTATION:  Follow up of diagnosed rectosigmoid cancer   Oncology History   Cancer Staging Rectal cancer [facility]) Staging form: Colon and Rectum, AJCC 8th Edition - Clinical stage from 10/14/2016: Stage IIIB (cT3, cN1, cM0) - Signed by Allison Merle, MD on 10/24/2016       Rectal cancer ([place])   10/14/2016 Initial Diagnosis    Rectal cancer ([place])      10/14/2016 Procedure    Colonoscopy by Dr. Hilarie Dunlap showed a fungating partially upchucking large mass in the rectosigmoid colon, the mass begins about 10 cm from the dentate line and measured 5 cm in length. Biopsy was obtained. Multiple small and largemouth diverticula were found in the sigmoid colon and the ascending colon.      10/14/2016 Initial Biopsy    Rectosigmoid mass biopsy showed adenocarcinoma       10/15/2016 Imaging    CT of chest, abdomen and pelvis with contrast showed a 4 cm annular colonic soft tissue mass near the rectosigmoid junction, consistent with known primary carcinoma. Several small her chronic lymph nodes measuring up to 22m, suspicious for local lymph node metastasis. No evidence of distant metastasis.      11/17/2016 - 12/25/2016 Radiation Therapy     Radiation Therapy by Dr. MLisbeth Dunlap     11/17/2016 - 12/08/2016 Chemotherapy    Concurrent chemo Xeloda (15069min am and 100026mn evening) to go along with radiation.   Hold chemo due to platelet count 12/08/16      02/18/2017 Surgery    XI ROBOTIC ASSISTED  LOWER ANTERIOR RESECTION WITH SPLENIC FLEXURE MOBILIZATION and INTRAOPERATIVE ASSESSMENT OF VASCULAR PERFUSION by Dr. ThoMarcello Dunlap   02/18/2017 Pathology Results    Diagnosis 02/18/17 1. Spleen - BENIGN SPLEEN (152 G) - NO MALIGNANCY IDENTIFIED 2. Colon, segmental resection for tumor, rectosigmoid - RESIDUAL ADENOCARCINOMA, MODERATELY DIFFERENTIATED - MARGINS UNINVOLVED BY CARCINOMA - NO CARCINOMA IDENTIFIED IN TEN LYMPH NODES (0/10) - SEE ONCOLOGY TABLE AND COMMENT BELOW 3. Rectum, resection, distal stump - BENIGN COLONIC MUCOSA - NO CARCINOMA IDENTIFIED IN ONE LYMPH NODE (0/1) 4. Colon, resection margin (donut), final distal - BENIGN COLONIC MUCOSA - NO CARCINOMA IDENTIFIED        Chemotherapy    Xeloda 6 pills once a day, 2 weeks on and 1 week off. Starting 03/18/17        HISTORY OF PRESENTING ILLNESS:  Allison Dunlap 66o. female is here because of Her recently diagnosed rectosigmoid colon cancer. She was referred by her gastroenterologist Dr. PyrHilarie Dunlap presents to my clinic by herself today.  She has been having constipationa and diarrhea for 6 months, and daily hematochezia, a tea spoon each time, no rectal pain, no bloating, nause or other symptoms, no change of appetie and weight. She does not have a primary care physician, and [facility] She was seen by Dr. PyrHilarie Dunlap underwent colonoscopy, which showed a large mass in the rectosigmoid junction. Biopsy showed adenocarcinoma.  She was diagnosed with ulcerative colitis in her 20's, resolved and now.  She  has had thrombocytopenia since 2013, she has bone marrow biopsy, which was normal per pt, never required any treatment    She is widowed, no children, has one brother in [place], she is not working   CURRENT THERAPY: Xeloda 1577m q12h, 2 weeks on and 1 weeks off, started on 03/23/17  INTERVAL HISTORY:  Allison Allersreturns today for follow up. She started Xeloda, and is taking  as directed. She is off this week and will restart next Monday. She reports a little diarrhea, queasiness, and fatigue with treatment but, these are manageable. She has been having trouble with digestion and timing her medication. She admits she doesn't have a huge appetite. Her weight is stable. She denies any pain. She will refill Xeloda next week.   MEDICAL HISTORY:  Past Medical History:  Diagnosis Date  . [place]/14/2016   rectal cancer-adenocarcinoma  . [place]   . Temporary low platelet count ([facility]     SURGICAL HISTORY: Past Surgical History:  Procedure Laterality Date  . COLONOSCOPY     with propofol   . INTRAOPERATIVE CHOLANGIOGRAM  02/18/2017   Procedure: INTRAOPERATIVE ASSESSMENT OF VASCULAR PERFUSION;  Surgeon: TLeighton Ruff MD;  Location: [facility];  Service: General;;  . LAPAROSCOPIC SPLENECTOMY  02/18/2017   Procedure: XI ROBOTIC ASSISTED SPLENECTOMY;  Surgeon: GMichael Boston MD;  Location: [facility];  Service: General;;  . NO PAST SURGERIES    . XI ROBOTIC ASSISTED LOWER ANTERIOR RESECTION N/A 02/18/2017   Procedure: XI ROBOTIC ASSISTED LOWER ANTERIOR RESECTION WITH SPLENIC FLEXURE MOBILIZATION;  Surgeon: TLeighton Ruff MD;  Location: [facility];  Service: General;  Laterality: N/A;    SOCIAL HISTORY: Social History   Social History  . Marital status: Widowed    Spouse name: N/A  . Number of children: 0  . Years of education: N/A   Occupational History  . retired    Social History Main Topics  . Smoking status: Former Smoker    Packs/day: 0.50    Years: 30.00    Quit date: 07/28/1997  . Smokeless tobacco: Never Used  . Alcohol use 4.2 oz/week    7 Glasses of wine per week     Comment: wine and beer  . Drug use: No  . Sexual activity: Not on file   Other Topics Concern  . Not on file   Social History Narrative  . No narrative on file    FAMILY HISTORY: Family History  Problem Relation Age of Onset  . Alzheimer's disease Mother     . Liver disease Father [phone]      failure  . Breast cancer Sister   . Cancer Sister 575      breast cancer   . Heart disease Maternal Grandfather   . Cancer Paternal Grandmother        type unknown    ALLERGIES:  has No Known Allergies.  MEDICATIONS:  Current Outpatient Prescriptions  Medication Sig Dispense Refill  . capecitabine (XELODA) 500 MG tablet Take 3 tablets (1,500 mg total) by mouth 2 (two) times daily after a meal. Take for 14 days on, 7 days off 84 tablet 5  . cholecalciferol (VITAMIN D) 1000 units tablet Take 1 tablet by mouth daily.    .Marland Kitchenibuprofen (ADVIL,MOTRIN) 200 MG tablet Take 1-2 tablets (200-400 mg total) by mouth every 6 (six) hours as needed (for pain). (Patient not taking: Reported on 03/19/2017)    . Multiple Vitamins-Minerals (OCUVITE PO) Take 1 tablet by mouth  daily.    . vitamin B-12 (CYANOCOBALAMIN) 1000 MCG tablet Take 1 tablet by mouth daily.     No current facility-administered medications for this visit.     REVIEW OF SYSTEMS:   Constitutional: Denies fevers, chills or abnormal night sweats (+) mild fatigue Eyes: Denies blurriness of vision, double vision or watery eyes Ears, nose, mouth, throat, and face: Denies mucositis or sore throat Respiratory: Denies cough, dyspnea or wheezes Cardiovascular: Denies palpitation, chest discomfort or lower extremity swelling Gastrointestinal:  Denies heartburn, No nausea or change of bowel habits Skin: No skin rash Lymphatics: Denies new lymphadenopathy or easy bruising Neurological:Denies numbness, tingling or new weaknesses Behavioral/Psych: Mood is stable, no new changes  All other systems were reviewed with the patient and are negative.  PHYSICAL EXAMINATION:  ECOG PERFORMANCE STATUS: 0  Vitals:   04/09/17 1146  BP: 110/71  Pulse: 68  Resp: 18  Temp: 98.1 F (36.7 C)  SpO2: 100%   Filed Weights   04/09/17 1146  Weight: 111 lb 1.6 oz (50.4 kg)    GENERAL:alert, no distress and  comfortable SKIN: skin color, texture, turgor are normal,  or significant lesions  EYES: normal, conjunctiva are pink and non-injected, sclera clear OROPHARYNX:no exudate, no erythema and lips, buccal mucosa, and tongue normal  NECK: supple, thyroid normal size, non-tender, without nodularity LYMPH:  no palpable lymphadenopathy in the cervical, axillary or inguinal LUNGS: clear to auscultation and percussion with normal breathing effort HEART: regular rate & rhythm and no murmurs and no lower extremity edema ABDOMEN:abdomen soft, non-tender and normal bowel sounds (+) multiple small incision in abdomen and 3 cm incision below umbilical, all healing well.  Musculoskeletal:no cyanosis of digits and no clubbing  PSYCH: alert & oriented x 3 with fluent speech NEURO: no focal motor/sensory deficits  LABORATORY DATA:  I have reviewed the data as listed CBC Latest Ref Rng & Units 04/09/2017 03/19/2017 03/04/2017  WBC 3.9 - 10.3 10e3/uL 4.8 6.5 7.6  Hemoglobin 11.6 - 15.9 g/dL 13.3 12.7 11.9  Hematocrit 34.8 - 46.6 % 40.5 39.4 36.5  Platelets 145 - 400 10e3/uL 317 389 629(H)   CMP Latest Ref Rng & Units 04/09/2017 03/19/2017 03/04/2017  Glucose 70 - 140 mg/dl 90 96 90  BUN 7.0 - 26.0 mg/dL 10.2 12.0 10.3  Creatinine 0.6 - 1.1 mg/dL 0.7 0.7 0.6  Sodium 136 - 145 mEq/L 142 141 141  Potassium 3.5 - 5.1 mEq/L 4.0 4.1 4.0  Chloride 101 - 111 mmol/L - - -  CO2 22 - 29 mEq/L _0 Calcium 8.4 - 10.4 mg/dL 9.4 10.0 9.1  Total Protein 6.4 - 8.3 g/dL 6.8 7.4 6.4  Total Bilirubin 0.20 - 1.20 mg/dL 0.35 0.32 0.27  Alkaline Phos 40 - 150 U/L 95 107 80  AST 5 - 34 U/L _1 ALT 0-55 U/L U/L <_2 PATHOLOGY REPORT   Diagnosis 02/18/17 1. Spleen - BENIGN SPLEEN (152 G) - NO MALIGNANCY IDENTIFIED 2. Colon, segmental resection for tumor, rectosigmoid - RESIDUAL ADENOCARCINOMA, MODERATELY DIFFERENTIATED - MARGINS UNINVOLVED BY CARCINOMA - NO CARCINOMA IDENTIFIED IN TEN LYMPH NODES (0/10) - SEE  ONCOLOGY TABLE AND COMMENT BELOW 3. Rectum, resection, distal stump - BENIGN COLONIC MUCOSA - NO CARCINOMA IDENTIFIED IN ONE LYMPH NODE (0/1) 4. Colon, resection margin (donut), final distal - BENIGN COLONIC MUCOSA - NO CARCINOMA IDENTIFIED Microscopic Comment 2. COLON AND RECTUM (INCLUDING TRANS-ANAL RESECTION): Specimen: Rectosigmoid colon Procedure: Low anterior resection Tumor site: Rectum Specimen  integrity: Intact Macroscopic intactness of mesorectum: Complete Macroscopic tumor perforation: Not identified Invasive tumor: Maximum size: 0.4 cm Histologic type(s): Adenocarcinoma Histologic grade and differentiation: G2 (moderately differentiated/low grade) Microscopic extension of invasive tumor: Tumor invades muscularis propria Lymph-Vascular invasion: Not identified Peri-neural invasion: Not identified Microscopic Comment(continued) Tumor deposit(s): Not identified Resection margins: Uninvolved by carcinoma Distance closest margin: Distal (2 cm) Treatment effect: Single cells or rare small groups of cancer cells (near complete response, score 1) Lymph nodes: number examined 11; number positive: 0 (See also Part 3) Pathologic Staging: ypT2, ypN0 (see comment) Ancillary studies: Available upon request (Block 2C) Comment: There is a small fibrotic nodule with necrosis, calcifications and foreign body giant cell reaction which may represent treatment effect within a tumor nodule or lymph node.   Diagnosis 10/14/2016 Colon, biopsy, rectosigmoid tumor - ADENOCARCINOMA.  RADIOGRAPHIC STUDIES: I have personally reviewed the radiological images as listed and agreed with the findings in the report. No results found.   CT CAP W Contrast: 10/15/16 IMPRESSION: 4 cm annular colonic soft tissue mass near the rectosigmoid junction, consistent with known primary carcinoma. Several small pericolonic lymph nodes measuring up to 10 mm, suspicious for local lymph node metastases. No  evidence of distant metastatic disease. Proximal sigmoid diverticulosis.  No evidence of diverticulitis.  Colonoscopy 10/14/2016 - The perianal exam findings include a perianal fungal rash. - The digital rectal exam was normal. - The terminal ileum appeared normal. - A fungating partially obstructing large mass was found in the recto-sigmoid colon. The mass was circumferential. The mass begins about 10 cm from the dentate line and measured five cm in length. Oozing was present. Multiple biopsies were obtained with cold forceps for histology in a targeted manner. Area proximal to the tumor was tattooed on opposite walls with an injection of 3 mL total of Spot (carbon black). - Multiple small and large-mouthed diverticula were found in the sigmoid colon and descending colon. - The retroflexed view of the distal rectum and anal verge was normal and showed no anal or rectal abnormalities.  ASSESSMENT & PLAN: 66 y.o.  Caucasian female, with past medical history of ulcerative colitis, thrombocytopenia, presented with diarrhea, constipation, and persistent hematochezia.  1. Rectosigmoid adenocarcinoma, cT3N1M0, stage IIIB, ypT2N0M0, MSI-pending  -I previously reviewed his CT scan findings, colonoscopy and biopsy pathology results in great details with patient. -The tumor is located in the proximal rectum and rectosigmoid junction. -Prior CT scan showed several perirectal lymph nodes, suspicious for node metastasis. She likely has locally advanced disease. -I previously presented her case in our GI tumor Board. Due to the location of the cancer, Dr. Marcello Dunlap recommends neoadjuvant chemotherapy and radiation, followed by surgical resection. -Tumor Board feels her disease is likely T3 N1 based on CT scan findings, EUS or MRI for staging is probably not needed. 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 -She had a great response to radiation and chemotherapy, her residual tumor was T2, nodes were negative.   -We previously discussed the surgical pathology results in detail. -Given her excellent response to neoadjuvant radiation and chemotherapy, she has minimal residual disease (near complete response), nodes negative, I previously  recommend her to take adjuvant Xeloda for 4-5 months.  - She has been tolerating Xeloda very well, denies any major issues or side effects with Xeloda. - She is currently on  her off week of Xeloda and will restart on Monday, 04/13/17. -Labs reviewed. WBC and platelet count stable. Weight stable.  2. 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 This is most consistent with ITP.  --He developed severe some cytopenia after low-dose chemotherapy Xeloda which was held for her new adjuvant treatment. -She is now status post splenectomy, had a great response, her thrombopenia has resolved. -Plt counts 629 (03/04/17) after surgery, will continue to monitor.  -We discussed her mild thrombocytosis is related to splenectomy, no significant high risk for thrombosis. We'll continue monitoring. -Plt 317 TODAY   3. History of ulcerative colitis -follow up with Dr. Hilarie Dunlap  -We previously discussed her that her colitis may exacerbate during the radiation and chemotherapy. -She may use Imodium as needed for diarrhea, she'll follow-up with Dr. Hilarie Dunlap if needed.  Plan - Labs reviewed. - start cycle 2 Xeloda on Monday, 04/13/17. - f/u in 3 weeks    No orders of the defined types were placed in this encounter.   All questions were answered. The patient knows to call the clinic with any problems, questions or concerns.  I spent 15 minutes counseling the patient  face to face. The total time spent in the appointment was 20 minutes and more than 50% was on counseling.  This document serves as a record of services personally performed by Allison Merle, MD. It was created on her behalf by Arlyce Harman, a trained medical scribe. The creation of this record is based on the scribe's personal observations and the provider's statements to them. This document has been checked and approved by the attending provider.    Allison Merle, MD 04/09/2017

## 2017-04-09 ENCOUNTER — Telehealth: Payer: Self-pay | Admitting: Hematology

## 2017-04-09 ENCOUNTER — Other Ambulatory Visit (HOSPITAL_BASED_OUTPATIENT_CLINIC_OR_DEPARTMENT_OTHER): Payer: Medicare Other

## 2017-04-09 ENCOUNTER — Encounter: Payer: Self-pay | Admitting: Hematology

## 2017-04-09 ENCOUNTER — Ambulatory Visit (HOSPITAL_BASED_OUTPATIENT_CLINIC_OR_DEPARTMENT_OTHER): Payer: Medicare Other | Admitting: Hematology

## 2017-04-09 VITALS — BP 110/71 | HR 68 | Temp 98.1°F | Resp 18 | Ht 59.0 in | Wt 111.1 lb

## 2017-04-09 DIAGNOSIS — C19 Malignant neoplasm of rectosigmoid junction: Secondary | ICD-10-CM

## 2017-04-09 DIAGNOSIS — D693 Immune thrombocytopenic purpura: Secondary | ICD-10-CM | POA: Diagnosis not present

## 2017-04-09 DIAGNOSIS — C20 Malignant neoplasm of rectum: Secondary | ICD-10-CM

## 2017-04-09 LAB — CBC WITH DIFFERENTIAL/PLATELET
BASO%: 1.7 % (ref 0.0–2.0)
BASOS ABS: 0.1 10*3/uL (ref 0.0–0.1)
EOS ABS: 0.2 10*3/uL (ref 0.0–0.5)
EOS%: 3.7 % (ref 0.0–7.0)
HCT: 40.5 % (ref 34.8–46.6)
HEMOGLOBIN: 13.3 g/dL (ref 11.6–15.9)
LYMPH#: 1.4 10*3/uL (ref 0.9–3.3)
LYMPH%: 28.3 % (ref 14.0–49.7)
MCH: 31.3 pg (ref 25.1–34.0)
MCHC: 32.8 g/dL (ref 31.5–36.0)
MCV: 95.5 fL (ref 79.5–101.0)
MONO#: 0.6 10*3/uL (ref 0.1–0.9)
MONO%: 13.5 % (ref 0.0–14.0)
NEUT%: 52.8 % (ref 38.4–76.8)
NEUTROS ABS: 2.5 10*3/uL (ref 1.5–6.5)
PLATELETS: 317 10*3/uL (ref 145–400)
RBC: 4.24 10*6/uL (ref 3.70–5.45)
RDW: 13.2 % (ref 11.2–14.5)
WBC: 4.8 10*3/uL (ref 3.9–10.3)

## 2017-04-09 LAB — COMPREHENSIVE METABOLIC PANEL
ALBUMIN: 3.5 g/dL (ref 3.5–5.0)
ALK PHOS: 95 U/L (ref 40–150)
ALT: <6 U/L (ref 0–55)
AST: 16 U/L (ref 5–34)
Anion Gap: 9 mEq/L (ref 3–11)
BILIRUBIN TOTAL: 0.35 mg/dL (ref 0.20–1.20)
BUN: 10.2 mg/dL (ref 7.0–26.0)
CO2: 27 mEq/L (ref 22–29)
Calcium: 9.4 mg/dL (ref 8.4–10.4)
Chloride: 107 mEq/L (ref 98–109)
Creatinine: 0.7 mg/dL (ref 0.6–1.1)
EGFR: 84 mL/min/{1.73_m2} — AB (ref >90)
GLUCOSE: 90 mg/dL (ref 70–140)
POTASSIUM: 4 meq/L (ref 3.5–5.1)
SODIUM: 142 meq/L (ref 136–145)
TOTAL PROTEIN: 6.8 g/dL (ref 6.4–8.3)

## 2017-04-09 LAB — CEA (IN HOUSE-CHCC): CEA (CHCC-IN HOUSE): 2.54 ng/mL (ref 0.00–5.00)

## 2017-04-09 NOTE — Telephone Encounter (Signed)
Scheduled appt per 9/13 los - Gave patient AVS - no calender needed.

## 2017-04-16 MED FILL — XELODA 500 MG TABLET: 500 | 21 days supply | Qty: 84 | Fill #1

## 2017-04-29 NOTE — Progress Notes (Signed)
Allison Dunlap  [phone] [phone]  Clinic Follow Up Note   Patient Care Team: Patient, No Pcp Per as PCP - General (General Practice) Allison Dunlap, Allison Lines, MD as Consulting Physician (Gastroenterology) Allison Ruff, MD as Consulting Physician (General Surgery) Allison Rudd, MD as Consulting Physician (Radiation Oncology) Allison Merle, MD as Consulting Physician (Hematology) Allison Boston, MD as Consulting Physician (General Surgery) 04/30/2017   CHIEF COMPLAINTS/PURPOSE OF CONSULTATION:  Follow up of diagnosed rectosigmoid cancer   Oncology History   Cancer Staging Rectal cancer [facility]) Staging form: Colon and Rectum, AJCC 8th Edition - Clinical stage from 10/14/2016: Stage IIIB (cT3, cN1, cM0) - Signed by Allison Merle, MD on 10/24/2016       Rectal cancer ([place])   10/14/2016 Initial Diagnosis    Rectal cancer ([place])      10/14/2016 Procedure    Colonoscopy by Dr. Hilarie Dunlap showed a fungating partially upchucking large mass in the rectosigmoid colon, the mass begins about 10 cm from the dentate line and measured 5 cm in length. Biopsy was obtained. Multiple small and largemouth diverticula were found in the sigmoid colon and the ascending colon.      10/14/2016 Initial Biopsy    Rectosigmoid mass biopsy showed adenocarcinoma       10/15/2016 Imaging    CT of chest, abdomen and pelvis with contrast showed a 4 cm annular colonic soft tissue mass near the rectosigmoid junction, consistent with known primary carcinoma. Several small her chronic lymph nodes measuring up to 38m, suspicious for local lymph node metastasis. No evidence of distant metastasis.      11/17/2016 - 12/25/2016 Radiation Therapy     Radiation Therapy by Dr. MLisbeth Dunlap     11/17/2016 - 12/08/2016 Chemotherapy    Concurrent chemo Xeloda (15063min am and 100023mn evening) to go along with radiation.   Hold chemo due to platelet count 12/08/16      02/18/2017 Surgery    XI ROBOTIC ASSISTED  LOWER ANTERIOR RESECTION WITH SPLENIC FLEXURE MOBILIZATION and INTRAOPERATIVE ASSESSMENT OF VASCULAR PERFUSION by Dr. ThoMarcello Dunlap   02/18/2017 Pathology Results    Diagnosis 02/18/17 1. Spleen - BENIGN SPLEEN (152 G) - NO MALIGNANCY IDENTIFIED 2. Colon, segmental resection for tumor, rectosigmoid - RESIDUAL ADENOCARCINOMA, MODERATELY DIFFERENTIATED - MARGINS UNINVOLVED BY CARCINOMA - NO CARCINOMA IDENTIFIED IN TEN LYMPH NODES (0/10) - SEE ONCOLOGY TABLE AND COMMENT BELOW 3. Rectum, resection, distal stump - BENIGN COLONIC MUCOSA - NO CARCINOMA IDENTIFIED IN ONE LYMPH NODE (0/1) 4. Colon, resection margin (donut), final distal - BENIGN COLONIC MUCOSA - NO CARCINOMA IDENTIFIED       03/23/2017 -  Chemotherapy    Xeloda 1500m60m2h, 2 weeks on and 1 weeks off, started on 03/23/17        HISTORY OF PRESENTING ILLNESS:  Allison Rincony74. female is here because of Her recently diagnosed rectosigmoid colon cancer. She was referred by her gastroenterologist Dr. PyrtHilarie Dunlap presents to my clinic by herself today.  She has been having constipationa and diarrhea for 6 months, and daily hematochezia, a tea spoon each time, no rectal pain, no bloating, nause or other symptoms, no change of appetie and weight. She does not have a primary care physician, and [facility] She was seen by Dr. PyrtHilarie Dunlap underwent colonoscopy, which showed a large mass in the rectosigmoid junction. Biopsy showed adenocarcinoma.  She was diagnosed with ulcerative colitis in her 20's, resolved and now.  She  has had thrombocytopenia since 2013, she has bone marrow biopsy, which was normal per pt, never required any treatment    She is widowed, no children, has one brother in [place], she is not working   CURRENT THERAPY: Adjuvant Xeloda 157m q12h, 2 weeks on and 1 weeks off, started on 03/23/17  INTERVAL HISTORY:  Allison Vandenbergreturns today for follow up. She presents to  the clinic today reporting she is doing better than she was 2 weeks ago when on Xeloda. Her weight is stable and she is able to eat more. She think Xeloda gives her slight diarrhea and fatigue. On the week she is off she feels much better. Her palms have not shown any skin peeling or cracks. She knows to use lotion and moisturize. She notes to currently feeling bloated but she is usually not. She has no other concerns. She has never had a flu vacccination and does not feel like she needs one. She will avoid those that may be sick since she is on chemo.     MEDICAL HISTORY:  Past Medical History:  Diagnosis Date  . Cancer ([place] 10/14/2016   rectal cancer-adenocarcinoma  . Clotting disorder ([place]   . Temporary low platelet count ([place]     SURGICAL HISTORY: Past Surgical History:  Procedure Laterality Date  . COLONOSCOPY     with propofol   . INTRAOPERATIVE CHOLANGIOGRAM  02/18/2017   Procedure: INTRAOPERATIVE ASSESSMENT OF VASCULAR PERFUSION;  Surgeon: TLeighton Ruff MD;  Location: [facility];  Service: General;;  . LAPAROSCOPIC SPLENECTOMY  02/18/2017   Procedure: XI ROBOTIC ASSISTED SPLENECTOMY;  Surgeon: GMichael Boston MD;  Location: [facility];  Service: General;;  . NO PAST SURGERIES    . XI ROBOTIC ASSISTED LOWER ANTERIOR RESECTION N/A 02/18/2017   Procedure: XI ROBOTIC ASSISTED LOWER ANTERIOR RESECTION WITH SPLENIC FLEXURE MOBILIZATION;  Surgeon: TLeighton Ruff MD;  Location: [facility];  Service: General;  Laterality: N/A;    SOCIAL HISTORY: Social History   Social History  . Marital status: Widowed    Spouse name: N/A  . Number of children: 0  . Years of education: N/A   Occupational History  . retired    Social History Main Topics  . Smoking status: Former Smoker    Packs/day: 0.50    Years: 30.00    Quit date: 07/28/1997  . Smokeless tobacco: Never Used  . Alcohol use 4.2 oz/week    7 Glasses of wine per week     Comment: wine and beer  . Drug use: No  . Sexual activity:  Not on file   Other Topics Concern  . Not on file   Social History Narrative  . No narrative on file    FAMILY HISTORY: Family History  Problem Relation Age of Onset  . Alzheimer's disease Mother   . Liver disease Father 653      failure  . Breast cancer Sister   . Cancer Sister 550      breast cancer   . Heart disease Maternal Grandfather   . Cancer Paternal Grandmother        type unknown    ALLERGIES:  has No Known Allergies.  MEDICATIONS:  Current Outpatient Prescriptions  Medication Sig Dispense Refill  . capecitabine (XELODA) 500 MG tablet Take 3 tablets (1,500 mg total) by mouth 2 (two) times daily after a meal. Take for 14 days on, 7 days off 84 tablet 5  . cholecalciferol (VITAMIN D) 1000 units tablet Take 1  tablet by mouth daily.    Marland Kitchen ibuprofen (ADVIL,MOTRIN) 200 MG tablet Take 1-2 tablets (200-400 mg total) by mouth every 6 (six) hours as needed (for pain).    . Multiple Vitamins-Minerals (OCUVITE PO) Take 1 tablet by mouth daily.    . vitamin B-12 (CYANOCOBALAMIN) 1000 MCG tablet Take 1 tablet by mouth daily.     No current facility-administered medications for this visit.     REVIEW OF SYSTEMS:   Constitutional: Denies fevers, chills or abnormal night sweats (+)  fatigue Eyes: Denies blurriness of vision, double vision or watery eyes Ears, nose, mouth, throat, and face: Denies mucositis or sore throat Respiratory: Denies cough, dyspnea or wheezes Cardiovascular: Denies palpitation, chest discomfort or lower extremity swelling Gastrointestinal:  Denies heartburn, No nausea or change of bowel habits (+) occasional diarrhea (+) slight bloating Skin: No skin rash Lymphatics: Denies new lymphadenopathy or easy bruising Neurological:Denies numbness, tingling or new weaknesses Behavioral/Psych: Mood is stable, no new changes  All other systems were reviewed with the patient and are negative.  PHYSICAL EXAMINATION:  ECOG PERFORMANCE STATUS: 0  Vitals:    04/30/17 1148  BP: 131/90  Pulse: 64  Resp: 20  Temp: 98.7 F (37.1 C)  SpO2: 100%   Filed Weights   04/30/17 1148  Weight: 113 lb 14.4 oz (51.7 kg)    GENERAL:alert, no distress and comfortable SKIN: skin color, texture, turgor are normal,  or significant lesions  EYES: normal, conjunctiva are pink and non-injected, sclera clear OROPHARYNX:no exudate, no erythema and lips, buccal mucosa, and tongue normal  NECK: supple, thyroid normal size, non-tender, without nodularity LYMPH:  no palpable lymphadenopathy in the cervical, axillary or inguinal LUNGS: clear to auscultation and percussion with normal breathing effort HEART: regular rate & rhythm and no murmurs and no lower extremity edema ABDOMEN:abdomen soft, non-tender and normal bowel sounds (+) multiple small incision in abdomen and 3 cm incision below umbilical, all healed well.  Musculoskeletal:no cyanosis of digits and no clubbing  PSYCH: alert & oriented x 3 with fluent speech NEURO: no focal motor/sensory deficits  LABORATORY DATA:  I have reviewed the data as listed CBC Latest Ref Rng & Units 04/30/2017 04/09/2017 03/19/2017  WBC 3.9 - 10.3 10e3/uL 6.0 4.8 6.5  Hemoglobin 11.6 - 15.9 g/dL 12.4 13.3 12.7  Hematocrit 34.8 - 46.6 % 38.5 40.5 39.4  Platelets 145 - 400 10e3/uL 273 317 389   CMP Latest Ref Rng & Units 04/30/2017 04/09/2017 03/19/2017  Glucose 70 - 140 mg/dl 91 90 96  BUN 7.0 - 26.0 mg/dL 13.3 10.2 12.0  Creatinine 0.6 - 1.1 mg/dL 0.7 0.7 0.7  Sodium 136 - 145 mEq/L 141 142 141  Potassium 3.5 - 5.1 mEq/L 4.0 4.0 4.1  Chloride 101 - 111 mmol/L - - -  CO2 22 - 29 mEq/L _0 Calcium 8.4 - 10.4 mg/dL 9.2 9.4 10.0  Total Protein 6.4 - 8.3 g/dL 6.5 6.8 7.4  Total Bilirubin 0.20 - 1.20 mg/dL 0.33 0.35 0.32  Alkaline Phos 40 - 150 U/L 93 95 107  AST 5 - 34 U/L _1 ALT 0 - 55 U/L 10 <6 11   PATHOLOGY REPORT   Diagnosis 02/18/17 1. Spleen - BENIGN SPLEEN (152 G) - NO MALIGNANCY IDENTIFIED 2. Colon,  segmental resection for tumor, rectosigmoid - RESIDUAL ADENOCARCINOMA, MODERATELY DIFFERENTIATED - MARGINS UNINVOLVED BY CARCINOMA - NO CARCINOMA IDENTIFIED IN TEN LYMPH NODES (0/10) - SEE ONCOLOGY TABLE AND COMMENT BELOW 3. Rectum, resection, distal  stump - BENIGN COLONIC MUCOSA - NO CARCINOMA IDENTIFIED IN ONE LYMPH NODE (0/1) 4. Colon, resection margin (donut), final distal - BENIGN COLONIC MUCOSA - NO CARCINOMA IDENTIFIED Microscopic Comment 2. COLON AND RECTUM (INCLUDING TRANS-ANAL RESECTION): Specimen: Rectosigmoid colon Procedure: Low anterior resection Tumor site: Rectum Specimen integrity: Intact Macroscopic intactness of mesorectum: Complete Macroscopic tumor perforation: Not identified Invasive tumor: Maximum size: 0.4 cm Histologic type(s): Adenocarcinoma Histologic grade and differentiation: G2 (moderately differentiated/low grade) Microscopic extension of invasive tumor: Tumor invades muscularis propria Lymph-Vascular invasion: Not identified Peri-neural invasion: Not identified Microscopic Comment(continued) Tumor deposit(s): Not identified Resection margins: Uninvolved by carcinoma Distance closest margin: Distal (2 cm) Treatment effect: Single cells or rare small groups of cancer cells (near complete response, score 1) Lymph nodes: number examined 11; number positive: 0 (See also Part 3) Pathologic Staging: ypT2, ypN0 (see comment) Ancillary studies: Available upon request (Block 2C) Comment: There is a small fibrotic nodule with necrosis, calcifications and foreign body giant cell reaction which may represent treatment effect within a tumor nodule or lymph node.   Diagnosis 10/14/2016 Colon, biopsy, rectosigmoid tumor - ADENOCARCINOMA.  RADIOGRAPHIC STUDIES: I have personally reviewed the radiological images as listed and agreed with the findings in the report. No results found.   CT CAP W Contrast: 10/15/16 IMPRESSION: 4 cm annular colonic soft tissue  mass near the rectosigmoid junction, consistent with known primary carcinoma. Several small pericolonic lymph nodes measuring up to 10 mm, suspicious for local lymph node metastases. No evidence of distant metastatic disease. Proximal sigmoid diverticulosis.  No evidence of diverticulitis.  Colonoscopy 10/14/2016 - The perianal exam findings include a perianal fungal rash. - The digital rectal exam was normal. - The terminal ileum appeared normal. - A fungating partially obstructing large mass was found in the recto-sigmoid colon. The mass was circumferential. The mass begins about 10 cm from the dentate line and measured five cm in length. Oozing was present. Multiple biopsies were obtained with cold forceps for histology in a targeted manner. Area proximal to the tumor was tattooed on opposite walls with an injection of 3 mL total of Spot (carbon black). - Multiple small and large-mouthed diverticula were found in the sigmoid colon and descending colon. - The retroflexed view of the distal rectum and anal verge was normal and showed no anal or rectal abnormalities.  ASSESSMENT & PLAN: 66 y.o.  Caucasian female, with past medical history of ulcerative colitis, thrombocytopenia, presented with diarrhea, constipation, and persistent hematochezia.  1. Rectosigmoid adenocarcinoma, cT3N1M0, stage IIIB, ypT2N0M0, MMR normal  -I previously reviewed his CT scan findings, colonoscopy and biopsy pathology results in great details with patient. -The tumor is located in the proximal rectum and rectosigmoid junction. -Prior CT scan showed several perirectal lymph nodes, suspicious for node metastasis. She likely has locally advanced disease. -I previously presented her case in our GI tumor Board. Due to the location of the cancer, Dr. Marcello Dunlap recommends neoadjuvant chemotherapy and radiation, followed by surgical resection. -Tumor Board feels her disease is likely T3 N1 based on CT scan findings,  EUS or MRI for staging is probably not needed. 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 -She had a great response to radiation and chemotherapy, her residual tumor was T2, nodes were negative.   -We previously discussed the surgical pathology results in detail. -Given her excellent response to  neoadjuvant radiation and chemotherapy, she has minimal residual disease (near complete response), nodes negative, I previously  recommend her to take adjuvant Xeloda for 4-5 months.  - She started Xeloda 03/23/17 and has been tolerating very well, previously denied any major issues or side effects with Xeloda. - She is currently on her off week of Xeloda and will start cycle 3 on Monday, 04/30/17. -Labs reviewed and she no longer ITP and she is not anemic. She is toleranting Xeloda well except for mild fatigue and ocassional diarrhea.  -I strongly encouraged her to remain active and keep a healthy diet.  -F/u with her in 3 weeks -I offered her Flu vaccination, pt declined today.   2. 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 This is most consistent with ITP.  --He developed severe some cytopenia after low-dose chemotherapy Xeloda which was held for her new adjuvant treatment. -She is now status post splenectomy, had a great response, her thrombopenia has resolved. -Plt counts 629 (03/04/17) after surgery, will continue to monitor.  -We discussed her mild thrombocytosis is related to splenectomy, no significant high risk for thrombosis. We'll continue monitoring. -Plt are normal at 317 (04/09/17) and have remained within normal limits.   3. History of ulcerative colitis -follow up with Dr. Hilarie Dunlap  -We  previously discussed her that her colitis may exacerbate during the radiation and chemotherapy. -She may use Imodium as needed for diarrhea, she'll follow-up with Dr. Hilarie Dunlap if needed.  Plan -Labs reviewed. - start cycle 3 Xeloda on Monday, 05/04/17. -lab and f/u in 3 weeks   No orders of the defined types were placed in this encounter.   All questions were answered. The patient knows to call the clinic with any problems, questions or concerns.  I spent 15 minutes counseling the patient face to face. The total time spent in the appointment was 20 minutes and more than 50% was on counseling.  This document serves as a record of services personally performed by Allison Merle, MD. It was created on her behalf by Joslyn Devon, a trained medical scribe. The creation of this record is based on the scribe's personal observations and the provider's statements to them. This document has been checked and approved by the attending provider.     Allison Merle, MD 04/30/2017

## 2017-04-30 ENCOUNTER — Ambulatory Visit (HOSPITAL_BASED_OUTPATIENT_CLINIC_OR_DEPARTMENT_OTHER): Payer: Medicare Other | Admitting: Hematology

## 2017-04-30 ENCOUNTER — Encounter: Payer: Self-pay | Admitting: Hematology

## 2017-04-30 ENCOUNTER — Telehealth: Payer: Self-pay | Admitting: Hematology

## 2017-04-30 ENCOUNTER — Other Ambulatory Visit (HOSPITAL_BASED_OUTPATIENT_CLINIC_OR_DEPARTMENT_OTHER): Payer: Medicare Other

## 2017-04-30 VITALS — BP 131/90 | HR 64 | Temp 98.7°F | Resp 20 | Ht 59.0 in | Wt 113.9 lb

## 2017-04-30 DIAGNOSIS — D473 Essential (hemorrhagic) thrombocythemia: Secondary | ICD-10-CM | POA: Diagnosis not present

## 2017-04-30 DIAGNOSIS — C19 Malignant neoplasm of rectosigmoid junction: Secondary | ICD-10-CM

## 2017-04-30 DIAGNOSIS — C20 Malignant neoplasm of rectum: Secondary | ICD-10-CM

## 2017-04-30 LAB — CBC WITH DIFFERENTIAL/PLATELET
BASO%: 1 % (ref 0.0–2.0)
Basophils Absolute: 0.1 10*3/uL (ref 0.0–0.1)
EOS%: 2.3 % (ref 0.0–7.0)
Eosinophils Absolute: 0.1 10*3/uL (ref 0.0–0.5)
HCT: 38.5 % (ref 34.8–46.6)
HEMOGLOBIN: 12.4 g/dL (ref 11.6–15.9)
LYMPH#: 1.6 10*3/uL (ref 0.9–3.3)
LYMPH%: 25.9 % (ref 14.0–49.7)
MCH: 31.3 pg (ref 25.1–34.0)
MCHC: 32.2 g/dL (ref 31.5–36.0)
MCV: 97.2 fL (ref 79.5–101.0)
MONO#: 0.9 10*3/uL (ref 0.1–0.9)
MONO%: 14.2 % — ABNORMAL HIGH (ref 0.0–14.0)
NEUT#: 3.4 10*3/uL (ref 1.5–6.5)
NEUT%: 56.6 % (ref 38.4–76.8)
NRBC: 1 % — AB (ref 0–0)
Platelets: 273 10*3/uL (ref 145–400)
RBC: 3.96 10*6/uL (ref 3.70–5.45)
RDW: 16.2 % — AB (ref 11.2–14.5)
WBC: 6 10*3/uL (ref 3.9–10.3)

## 2017-04-30 LAB — COMPREHENSIVE METABOLIC PANEL
ALBUMIN: 3.5 g/dL (ref 3.5–5.0)
ALK PHOS: 93 U/L (ref 40–150)
ALT: 10 U/L (ref 0–55)
AST: 17 U/L (ref 5–34)
Anion Gap: 6 mEq/L (ref 3–11)
BILIRUBIN TOTAL: 0.33 mg/dL (ref 0.20–1.20)
BUN: 13.3 mg/dL (ref 7.0–26.0)
CO2: 26 meq/L (ref 22–29)
CREATININE: 0.7 mg/dL (ref 0.6–1.1)
Calcium: 9.2 mg/dL (ref 8.4–10.4)
Chloride: 108 mEq/L (ref 98–109)
EGFR: >90 mL/min/{1.73_m2} (ref >90)
GLUCOSE: 91 mg/dL (ref 70–140)
Potassium: 4 mEq/L (ref 3.5–5.1)
Sodium: 141 mEq/L (ref 136–145)
TOTAL PROTEIN: 6.5 g/dL (ref 6.4–8.3)

## 2017-04-30 LAB — CEA (IN HOUSE-CHCC): CEA (CHCC-In House): 3.27 ng/mL (ref 0.00–5.00)

## 2017-04-30 NOTE — Telephone Encounter (Signed)
Scheduled appt per 10/4 los - Gave patient AVS and calender per los.

## 2017-05-06 ENCOUNTER — Encounter: Payer: Self-pay | Admitting: Hematology

## 2017-05-07 MED FILL — XELODA 500 MG TABLET: 500 | 21 days supply | Qty: 84 | Fill #2

## 2017-05-21 ENCOUNTER — Ambulatory Visit (HOSPITAL_BASED_OUTPATIENT_CLINIC_OR_DEPARTMENT_OTHER): Payer: Medicare Other | Admitting: Nurse Practitioner

## 2017-05-21 ENCOUNTER — Telehealth: Payer: Self-pay | Admitting: Hematology

## 2017-05-21 ENCOUNTER — Other Ambulatory Visit (HOSPITAL_BASED_OUTPATIENT_CLINIC_OR_DEPARTMENT_OTHER): Payer: Medicare Other

## 2017-05-21 VITALS — BP 119/71 | HR 65 | Temp 98.4°F | Resp 18 | Ht 59.0 in | Wt 116.1 lb

## 2017-05-21 DIAGNOSIS — C19 Malignant neoplasm of rectosigmoid junction: Secondary | ICD-10-CM

## 2017-05-21 DIAGNOSIS — R194 Change in bowel habit: Secondary | ICD-10-CM

## 2017-05-21 DIAGNOSIS — Z862 Personal history of diseases of the blood and blood-forming organs and certain disorders involving the immune mechanism: Secondary | ICD-10-CM | POA: Diagnosis not present

## 2017-05-21 DIAGNOSIS — C20 Malignant neoplasm of rectum: Secondary | ICD-10-CM

## 2017-05-21 DIAGNOSIS — L989 Disorder of the skin and subcutaneous tissue, unspecified: Secondary | ICD-10-CM

## 2017-05-21 LAB — CBC WITH DIFFERENTIAL/PLATELET
BASO%: 1.9 % (ref 0.0–2.0)
Basophils Absolute: 0.1 10*3/uL (ref 0.0–0.1)
EOS%: 2.4 % (ref 0.0–7.0)
Eosinophils Absolute: 0.2 10*3/uL (ref 0.0–0.5)
HCT: 39.3 % (ref 34.8–46.6)
HEMOGLOBIN: 13.1 g/dL (ref 11.6–15.9)
LYMPH#: 1.5 10*3/uL (ref 0.9–3.3)
LYMPH%: 23.8 % (ref 14.0–49.7)
MCH: 32.6 pg (ref 25.1–34.0)
MCHC: 33.3 g/dL (ref 31.5–36.0)
MCV: 97.8 fL (ref 79.5–101.0)
MONO#: 0.9 10*3/uL (ref 0.1–0.9)
MONO%: 14.9 % — AB (ref 0.0–14.0)
NEUT#: 3.6 10*3/uL (ref 1.5–6.5)
NEUT%: 57 % (ref 38.4–76.8)
Platelets: 285 10*3/uL (ref 145–400)
RBC: 4.02 10*6/uL (ref 3.70–5.45)
RDW: 20 % — ABNORMAL HIGH (ref 11.2–14.5)
WBC: 6.3 10*3/uL (ref 3.9–10.3)
nRBC: 1 % — ABNORMAL HIGH (ref 0–0)

## 2017-05-21 LAB — COMPREHENSIVE METABOLIC PANEL
ALBUMIN: 3.7 g/dL (ref 3.5–5.0)
ALK PHOS: 91 U/L (ref 40–150)
ALT: 11 U/L (ref 0–55)
AST: 18 U/L (ref 5–34)
Anion Gap: 9 mEq/L (ref 3–11)
BUN: 14 mg/dL (ref 7.0–26.0)
CHLORIDE: 107 meq/L (ref 98–109)
CO2: 27 meq/L (ref 22–29)
Calcium: 9.4 mg/dL (ref 8.4–10.4)
Creatinine: 0.7 mg/dL (ref 0.6–1.1)
EGFR: >60 mL/min/{1.73_m2} (ref >60)
GLUCOSE: 94 mg/dL (ref 70–140)
POTASSIUM: 3.9 meq/L (ref 3.5–5.1)
SODIUM: 143 meq/L (ref 136–145)
Total Bilirubin: 0.52 mg/dL (ref 0.20–1.20)
Total Protein: 6.8 g/dL (ref 6.4–8.3)

## 2017-05-21 NOTE — Progress Notes (Signed)
  [facility] OFFICE PROGRESS NOTE   Diagnosis:  Rectosigmoid cancer  INTERVAL HISTORY:   Ms. Beam returns as scheduled. She completed cycle 3 adjuvant Xeloda beginning 04/30/2017. She denies nausea/vomiting. No mouth sores. She has periodic loose stools. She notes that her hands are dry with a few areas of "cracked" skin. No associated pain. No redness or pain involving the feet.  Objective:  Vital signs in last 24 hours:  Blood pressure 119/71, pulse 65, temperature 98.4 F (36.9 C), temperature source Oral, resp. rate 18, height 4' 11"  (1.499 m), weight 116 lb 1.6 oz (52.7 kg), SpO2 100 %.    HEENT: no thrush or ulcers. Resp: lungs clear bilaterally. Cardio: regular rate and rhythm. GI: abdomen soft and nontender. No hepatomegaly. Vascular: no leg edema. Skin: palms with erythema. No skin breakdown.    Lab Results:  Lab Results  Component Value Date   WBC 6.3 05/21/2017   HGB 13.1 05/21/2017   HCT 39.3 05/21/2017   MCV 97.8 05/21/2017   PLT 285 05/21/2017   NEUTROABS 3.6 05/21/2017    Imaging:  No results found.  Medications: I have reviewed the patient's current medications.  Assessment/Plan: 1. Rectosigmoid adenocarcinoma, cT3N1M0, stage IIIB, ypT2N0M0, MMR normal;initiation of radiation/Xeloda 11/17/2016; radiation completed 12/25/2016; Xeloda held beginning 12/08/2016 due to severe thombocytopenia. Initiation of adjuvant Xeloda 03/23/2017. 2. History of ITP 3. History of ulcerative colitis followed by Dr. Hilarie Fredrickson   Disposition: Ms. Dorris Singh appears stable. She has completed 3 cycles of adjuvant Xeloda. Plan to proceed with cycle 4 as scheduled beginning 05/25/2017. She will return for labs and a follow-up visit in 3 weeks. She will contact the office in the interim with any problems. We specifically discussed signs/symptoms suggestive of progressive hand-foot syndrome.    Ned Card ANP/GNP-BC   05/21/2017  1:51 PM

## 2017-05-21 NOTE — Telephone Encounter (Signed)
Gave avs and calendar November

## 2017-05-28 MED FILL — XELODA 500 MG TABLET: 500 | 21 days supply | Qty: 84 | Fill #3

## 2017-06-10 NOTE — Progress Notes (Signed)
[place]  [phone] [phone]  Clinic Follow Up Note   Patient Care Team: Patient, No Pcp Per as PCP - General (General Practice) Pyrtle, Lajuan Lines, MD as Consulting Physician (Gastroenterology) Leighton Ruff, MD as Consulting Physician (General Surgery) Kyung Rudd, MD as Consulting Physician (Radiation Oncology) Truitt Merle, MD as Consulting Physician (Hematology) Michael Boston, MD as Consulting Physician (General Surgery) 06/11/2017   CHIEF COMPLAINTS:  Follow up of diagnosed rectosigmoid cancer   Oncology History   Cancer Staging Rectal cancer [facility]) Staging form: Colon and Rectum, AJCC 8th Edition - Clinical stage from 10/14/2016: Stage IIIB (cT3, cN1, cM0) - Signed by Truitt Merle, MD on 10/24/2016       Rectal cancer ([place])   10/14/2016 Initial Diagnosis    Rectal cancer ([place])      10/14/2016 Procedure    Colonoscopy by Dr. Hilarie Fredrickson showed a fungating partially upchucking large mass in the rectosigmoid colon, the mass begins about 10 cm from the dentate line and measured 5 cm in length. Biopsy was obtained. Multiple small and largemouth diverticula were found in the sigmoid colon and the ascending colon.      10/14/2016 Initial Biopsy    Rectosigmoid mass biopsy showed adenocarcinoma       10/15/2016 Imaging    CT of chest, abdomen and pelvis with contrast showed a 4 cm annular colonic soft tissue mass near the rectosigmoid junction, consistent with known primary carcinoma. Several small her chronic lymph nodes measuring up to 43m, suspicious for local lymph node metastasis. No evidence of distant metastasis.      11/17/2016 - 12/25/2016 Radiation Therapy     Radiation Therapy by Dr. MLisbeth Renshaw     11/17/2016 - 12/08/2016 Chemotherapy    Concurrent chemo Xeloda (15020min am and 100063mn evening) to go along with radiation.   Hold chemo due to platelet count 12/08/16      02/18/2017 Surgery    XI ROBOTIC ASSISTED LOWER ANTERIOR  RESECTION WITH SPLENIC FLEXURE MOBILIZATION and INTRAOPERATIVE ASSESSMENT OF VASCULAR PERFUSION by Dr. ThoMarcello Moores   02/18/2017 Pathology Results    Diagnosis 02/18/17 1. Spleen - BENIGN SPLEEN (152 G) - NO MALIGNANCY IDENTIFIED 2. Colon, segmental resection for tumor, rectosigmoid - RESIDUAL ADENOCARCINOMA, MODERATELY DIFFERENTIATED - MARGINS UNINVOLVED BY CARCINOMA - NO CARCINOMA IDENTIFIED IN TEN LYMPH NODES (0/10) - SEE ONCOLOGY TABLE AND COMMENT BELOW 3. Rectum, resection, distal stump - BENIGN COLONIC MUCOSA - NO CARCINOMA IDENTIFIED IN ONE LYMPH NODE (0/1) 4. Colon, resection margin (donut), final distal - BENIGN COLONIC MUCOSA - NO CARCINOMA IDENTIFIED       03/23/2017 -  Chemotherapy    Xeloda 1500m3m2h, 2 weeks on and 1 weeks off, started on 03/23/17        HISTORY OF PRESENTING ILLNESS:  Allison Ostroffy66. female is here because of Her recently diagnosed rectosigmoid colon cancer. She was referred by her gastroenterologist Dr. PyrtHilarie Fredricksone presents to my clinic by herself today.  She has been having constipation and diarrhea for 6 months, and daily hematochezia, a tea spoon each time, no rectal pain, no bloating, nausea or other symptoms, no change of appetite and weight. She does not have a primary care physician, and [facility] She was seen by Dr. PyrtHilarie Fredricksone underwent colonoscopy, which showed a large mass in the rectosigmoid junction. Biopsy showed adenocarcinoma.  She was diagnosed with ulcerative colitis in her 20's, resolved and now.  She has had  thrombocytopenia since 2013, she has bone marrow biopsy, which was normal per pt, never required any treatment    She is widowed, no children, has one brother in [place], she is not working   CURRENT THERAPY: Adjuvant Xeloda 1530m q12h, 2 weeks on and 1 weeks off, started on 03/23/17, plan for 6 cycles   INTERVAL HISTORY:  Allison Munozreturns today for follow up. She presents  to the clinic today noting no significant issue from Xeloda. She does have mild diarrhea but it is tolerable. This is her current off week but wants to know can she push her start day for one day [place] to start [place] 5 on 06/16/17.    MEDICAL HISTORY:  Past Medical History:  Diagnosis Date  . Cancer ([place] 10/14/2016   rectal cancer-adenocarcinoma  . Clotting disorder ([place]   . Temporary low platelet count ([place]     SURGICAL HISTORY: Past Surgical History:  Procedure Laterality Date  . COLONOSCOPY     with propofol   . INTRAOPERATIVE CHOLANGIOGRAM  02/18/2017   Procedure: INTRAOPERATIVE ASSESSMENT OF VASCULAR PERFUSION;  Surgeon: TLeighton Ruff MD;  Location: [facility];  Service: General;;  . LAPAROSCOPIC SPLENECTOMY  02/18/2017   Procedure: XI ROBOTIC ASSISTED SPLENECTOMY;  Surgeon: GMichael Boston MD;  Location: [facility];  Service: General;;  . NO PAST SURGERIES    . XI ROBOTIC ASSISTED LOWER ANTERIOR RESECTION N/A 02/18/2017   Procedure: XI ROBOTIC ASSISTED LOWER ANTERIOR RESECTION WITH SPLENIC FLEXURE MOBILIZATION;  Surgeon: TLeighton Ruff MD;  Location: [facility];  Service: General;  Laterality: N/A;    SOCIAL HISTORY: Social History   Socioeconomic History  . Marital status: Widowed    Spouse name: Not on file  . Number of children: 0  . Years of education: Not on file  . Highest education level: Not on file  Social Needs  . Financial resource strain: Not on file  . Food insecurity - worry: Not on file  . Food insecurity - inability: Not on file  . Transportation needs - medical: Not on file  . Transportation needs - non-medical: Not on file  Occupational History  . Occupation: retired  Tobacco Use  . Smoking status: Former Smoker    Packs/day: 0.50    Years: 30.00    Pack years: 15.00    Last attempt to quit: 07/28/1997    Years since quitting: 19.8  . Smokeless tobacco: Never Used  Substance and Sexual Activity  .  Alcohol use: Yes    Alcohol/week: 4.2 oz    Types: 7 Glasses of wine per week    Comment: wine and beer  . Drug use: No  . Sexual activity: Not on file  Other Topics Concern  . Not on file  Social History Narrative  . Not on file    FAMILY HISTORY: Family History  Problem Relation Age of Onset  . Alzheimer's disease Mother   . Liver disease Father [phone]      failure  . Breast cancer Sister   . Cancer Sister [phone]      breast cancer   . Heart disease Maternal Grandfather   . Cancer Paternal Grandmother        type unknown    ALLERGIES:  has No Known Allergies.  MEDICATIONS:  Current Outpatient Medications  Medication Sig Dispense Refill  . capecitabine (XELODA) 500 MG tablet Take 3 tablets (1,500 mg total) by mouth 2 (two)  times daily after a meal. Take for 14 days on, 7 days off 84 tablet 5  . cholecalciferol (VITAMIN D) 1000 units tablet Take 1 tablet by mouth daily.    Marland Kitchen ibuprofen (ADVIL,MOTRIN) 200 MG tablet Take 1-2 tablets (200-400 mg total) by mouth every 6 (six) hours as needed (for pain).    . Multiple Vitamins-Minerals (OCUVITE PO) Take 1 tablet by mouth daily.    . vitamin B-12 (CYANOCOBALAMIN) 1000 MCG tablet Take 1 tablet by mouth daily.     No current facility-administered medications for this visit.     REVIEW OF SYSTEMS:  Constitutional: Denies fevers, chills or abnormal night sweats Eyes: Denies blurriness of vision, double vision or watery eyes Ears, nose, mouth, throat, and face: Denies mucositis or sore throat Respiratory: Denies cough, dyspnea or wheezes Cardiovascular: Denies palpitation, chest discomfort or lower extremity swelling Gastrointestinal:  Denies heartburn, No nausea or change of bowel habits (+) occasional diarrhea Skin: No skin rash Lymphatics: Denies new lymphadenopathy or easy bruising Neurological:Denies numbness, tingling or new weaknesses Behavioral/Psych: Mood is stable, no new changes  All other systems were reviewed with the  patient and are negative.  PHYSICAL EXAMINATION:  ECOG PERFORMANCE STATUS: 0 06/11/17 Vitals: W: 116.3 lbs T: 98 Ox: 100% P: 65 R: 18 BP: 118/76 GENERAL:alert, no distress and comfortable SKIN: skin color, texture, turgor are normal,  or significant lesions  EYES: normal, conjunctiva are pink and non-injected, sclera clear OROPHARYNX:no exudate, no erythema and lips, buccal mucosa, and tongue normal  NECK: supple, thyroid normal size, non-tender, without nodularity LYMPH:  no palpable lymphadenopathy in the cervical, axillary or inguinal LUNGS: clear to auscultation and percussion with normal breathing effort HEART: regular rate & rhythm and no murmurs and no lower extremity edema ABDOMEN:abdomen soft, non-tender and normal bowel sounds (+) multiple small incision in abdomen and 3 cm incision below umbilical, all healed well.  Musculoskeletal:no cyanosis of digits and no clubbing  PSYCH: alert & oriented x 3 with fluent speech NEURO: no focal motor/sensory deficits  LABORATORY DATA:  I have reviewed the data as listed CBC Latest Ref Rng & Units 06/11/2017 05/21/2017 04/30/2017  WBC 3.9 - 10.3 10e3/uL 5.3 6.3 6.0  Hemoglobin 11.6 - 15.9 g/dL 13.5 13.1 12.4  Hematocrit 34.8 - 46.6 % 41.0 39.3 38.5  Platelets 145 - 400 10e3/uL 288 285 273   CMP Latest Ref Rng & Units 06/11/2017 05/21/2017 04/30/2017  Glucose 70 - 140 mg/dl 85 94 91  BUN 7.0 - 26.0 mg/dL 14.7 14.0 13.3  Creatinine 0.6 - 1.1 mg/dL 0.9 0.7 0.7  Sodium 136 - 145 mEq/L 142 143 141  Potassium 3.5 - 5.1 mEq/L 3.8 3.9 4.0  Chloride 101 - 111 mmol/L - - -  CO2 22 - 29 mEq/L 27 27 26   Calcium 8.4 - 10.4 mg/dL 9.6 9.4 9.2  Total Protein 6.4 - 8.3 g/dL 7.2 6.8 6.5  Total Bilirubin 0.20 - 1.20 mg/dL 0.67 0.52 0.33  Alkaline Phos 40 - 150 U/L 104 91 93  AST 5 - 34 U/L 25 18 17   ALT 0 - 55 U/L 18 11 10    PATHOLOGY REPORT   Diagnosis 02/18/17 1. Spleen - BENIGN SPLEEN (152 G) - NO MALIGNANCY IDENTIFIED 2. Colon, segmental  resection for tumor, rectosigmoid - RESIDUAL ADENOCARCINOMA, MODERATELY DIFFERENTIATED - MARGINS UNINVOLVED BY CARCINOMA - NO CARCINOMA IDENTIFIED IN TEN LYMPH NODES (0/10) - SEE ONCOLOGY TABLE AND COMMENT BELOW 3. Rectum, resection, distal stump - BENIGN COLONIC MUCOSA - NO CARCINOMA IDENTIFIED  IN ONE LYMPH NODE (0/1) 4. Colon, resection margin (donut), final distal - BENIGN COLONIC MUCOSA - NO CARCINOMA IDENTIFIED Microscopic Comment 2. COLON AND RECTUM (INCLUDING TRANS-ANAL RESECTION): Specimen: Rectosigmoid colon Procedure: Low anterior resection Tumor site: Rectum Specimen integrity: Intact Macroscopic intactness of mesorectum: Complete Macroscopic tumor perforation: Not identified Invasive tumor: Maximum size: 0.4 cm Histologic type(s): Adenocarcinoma Histologic grade and differentiation: G2 (moderately differentiated/low grade) Microscopic extension of invasive tumor: Tumor invades muscularis propria Lymph-Vascular invasion: Not identified Peri-neural invasion: Not identified Microscopic Comment(continued) Tumor deposit(s): Not identified Resection margins: Uninvolved by carcinoma Distance closest margin: Distal (2 cm) Treatment effect: Single cells or rare small groups of cancer cells (near complete response, score 1) Lymph nodes: number examined 11; number positive: 0 (See also Part 3) Pathologic Staging: ypT2, ypN0 (see comment) Ancillary studies: Available upon request (Block 2C) Comment: There is a small fibrotic nodule with necrosis, calcifications and foreign body giant cell reaction which may represent treatment effect within a tumor nodule or lymph node.   Diagnosis 10/14/2016 Colon, biopsy, rectosigmoid tumor - ADENOCARCINOMA.  RADIOGRAPHIC STUDIES: I have personally reviewed the radiological images as listed and agreed with the findings in the report. No results found.   CT CAP W Contrast: 10/15/16 IMPRESSION: 4 cm annular colonic soft tissue mass near  the rectosigmoid junction, consistent with known primary carcinoma. Several small pericolonic lymph nodes measuring up to 10 mm, suspicious for local lymph node metastases. No evidence of distant metastatic disease. Proximal sigmoid diverticulosis.  No evidence of diverticulitis.  Colonoscopy 10/14/2016 - The perianal exam findings include a perianal fungal rash. - The digital rectal exam was normal. - The terminal ileum appeared normal. - A fungating partially obstructing large mass was found in the recto-sigmoid colon. The mass was circumferential. The mass begins about 10 cm from the dentate line and measured five cm in length. Oozing was present. Multiple biopsies were obtained with cold forceps for histology in a targeted manner. Area proximal to the tumor was tattooed on opposite walls with an injection of 3 mL total of Spot (carbon black). - Multiple small and large-mouthed diverticula were found in the sigmoid colon and descending colon. - The retroflexed view of the distal rectum and anal verge was normal and showed no anal or rectal abnormalities.  ASSESSMENT & PLAN: 66 y.o.  Caucasian female, with past medical history of ulcerative colitis, thrombocytopenia, presented with diarrhea, constipation, and persistent hematochezia.  1. Rectosigmoid adenocarcinoma, cT3N1M0, stage IIIB, ypT2N0M0, MMR normal  -I previously reviewed his CT scan findings, colonoscopy and biopsy pathology results in great details with patient. -The tumor is located in the proximal rectum and rectosigmoid junction. -Prior CT scan showed several perirectal lymph nodes, suspicious for node metastasis. She likely has locally advanced disease. -I previously presented her case in our GI tumor Board. Due to the location of the cancer, Dr. Marcello Moores recommends neoadjuvant chemotherapy and radiation, followed by surgical resection. -Tumor Board feels her disease is likely T3 N1 based on CT scan findings, EUS or MRI  for staging is probably not needed. 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 -She had a great response to radiation and chemotherapy, her residual tumor was T2, nodes were negative.   -We previously discussed the surgical pathology results in detail. -Given her excellent response to neoadjuvant radiation and chemotherapy, she has minimal residual disease (  near complete response), nodes negative, I previously  recommend her to take adjuvant Xeloda for 4-5 months.  - She started Xeloda 03/23/17 and has been tolerating very well, previously denied any major issues or side effects with Xeloda.  -I recommended her to periodically move around every 2 hours for her long drive next week.  -She plans to start cycle 5 on 06/16/17. She will complete her 6 cycles in 06/2017. Then order a surveillance CT scan after she completes adjuvant chemotherapy. -I will follow up with her next month then every 3-4 months for the next year then every 6 months for a total of 5 years surveillance.    2. 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 This is most consistent with ITP.  --He developed severe some cytopenia after low-dose chemotherapy Xeloda which was held for her new adjuvant treatment. -She is now status post splenectomy, had a great response, her thrombopenia has resolved. -Continue monitoring, she has not had any thrombocytopenia from adjuvant Xeloda.   3. History of ulcerative colitis -follow up with Dr. Hilarie Fredrickson  -We previously discussed her that her colitis may exacerbate during the radiation and chemotherapy. -She may use Imodium as needed for diarrhea, she'll follow-up with Dr. Hilarie Fredrickson if  needed.  Plan -Start cycle 5 Xeloda on 06/16/17 -Lab and f/u in 3 weeks before last cycle chemo, will order CT scan on next visit    No orders of the defined types were placed in this encounter.   All questions were answered. The patient knows to call the clinic with any problems, questions or concerns.  I spent 15 minutes counseling the patient face to face. The total time spent in the appointment was 20 minutes and more than 50% was on counseling.  This document serves as a record of services personally performed by Truitt Merle, MD. It was created on her behalf by Joslyn Devon, a trained medical scribe. The creation of this record is based on the scribe's personal observations and the provider's statements to them.    I have reviewed the above documentation for accuracy and completeness, and I agree with the above.     Truitt Merle, MD 06/11/2017

## 2017-06-11 ENCOUNTER — Other Ambulatory Visit (HOSPITAL_BASED_OUTPATIENT_CLINIC_OR_DEPARTMENT_OTHER): Payer: Medicare Other

## 2017-06-11 ENCOUNTER — Telehealth: Payer: Self-pay | Admitting: Hematology

## 2017-06-11 ENCOUNTER — Encounter: Payer: Self-pay | Admitting: Hematology

## 2017-06-11 ENCOUNTER — Ambulatory Visit (HOSPITAL_BASED_OUTPATIENT_CLINIC_OR_DEPARTMENT_OTHER): Payer: Medicare Other | Admitting: Hematology

## 2017-06-11 DIAGNOSIS — C19 Malignant neoplasm of rectosigmoid junction: Secondary | ICD-10-CM

## 2017-06-11 DIAGNOSIS — C20 Malignant neoplasm of rectum: Secondary | ICD-10-CM

## 2017-06-11 DIAGNOSIS — R197 Diarrhea, unspecified: Secondary | ICD-10-CM | POA: Diagnosis not present

## 2017-06-11 LAB — CBC WITH DIFFERENTIAL/PLATELET
BASO%: 1.7 % (ref 0.0–2.0)
BASOS ABS: 0.1 10*3/uL (ref 0.0–0.1)
EOS ABS: 0.2 10*3/uL (ref 0.0–0.5)
EOS%: 3.6 % (ref 0.0–7.0)
HEMATOCRIT: 41 % (ref 34.8–46.6)
HGB: 13.5 g/dL (ref 11.6–15.9)
LYMPH%: 28.3 % (ref 14.0–49.7)
MCH: 33.2 pg (ref 25.1–34.0)
MCHC: 32.9 g/dL (ref 31.5–36.0)
MCV: 100.7 fL (ref 79.5–101.0)
MONO#: 0.7 10*3/uL (ref 0.1–0.9)
MONO%: 12.8 % (ref 0.0–14.0)
NEUT%: 53.6 % (ref 38.4–76.8)
NEUTROS ABS: 2.9 10*3/uL (ref 1.5–6.5)
PLATELETS: 288 10*3/uL (ref 145–400)
RBC: 4.07 10*6/uL (ref 3.70–5.45)
RDW: 23.8 % — ABNORMAL HIGH (ref 11.2–14.5)
WBC: 5.3 10*3/uL (ref 3.9–10.3)
lymph#: 1.5 10*3/uL (ref 0.9–3.3)
nRBC: 1 % — ABNORMAL HIGH (ref 0–0)

## 2017-06-11 LAB — COMPREHENSIVE METABOLIC PANEL
ALT: 18 U/L (ref 0–55)
AST: 25 U/L (ref 5–34)
Albumin: 3.8 g/dL (ref 3.5–5.0)
Alkaline Phosphatase: 104 U/L (ref 40–150)
Anion Gap: 9 mEq/L (ref 3–11)
BUN: 14.7 mg/dL (ref 7.0–26.0)
CALCIUM: 9.6 mg/dL (ref 8.4–10.4)
CHLORIDE: 107 meq/L (ref 98–109)
CO2: 27 meq/L (ref 22–29)
Creatinine: 0.9 mg/dL (ref 0.6–1.1)
EGFR: >60 mL/min/{1.73_m2} (ref >60)
GLUCOSE: 85 mg/dL (ref 70–140)
POTASSIUM: 3.8 meq/L (ref 3.5–5.1)
SODIUM: 142 meq/L (ref 136–145)
Total Bilirubin: 0.67 mg/dL (ref 0.20–1.20)
Total Protein: 7.2 g/dL (ref 6.4–8.3)

## 2017-06-11 LAB — CEA (IN HOUSE-CHCC): CEA (CHCC-In House): 4.74 ng/mL (ref 0.00–5.00)

## 2017-06-11 NOTE — Telephone Encounter (Signed)
Patient scheduled per 11/15 los. Patient declined AVS and calendar of upcoming December appointments.

## 2017-06-15 MED FILL — XELODA 500 MG TABLET: 500 | 21 days supply | Qty: 84 | Fill #4

## 2017-06-29 DIAGNOSIS — C20 Malignant neoplasm of rectum: Secondary | ICD-10-CM | POA: Diagnosis not present

## 2017-07-02 ENCOUNTER — Ambulatory Visit (HOSPITAL_BASED_OUTPATIENT_CLINIC_OR_DEPARTMENT_OTHER): Payer: Medicare Other | Admitting: Nurse Practitioner

## 2017-07-02 ENCOUNTER — Encounter: Payer: Self-pay | Admitting: Nurse Practitioner

## 2017-07-02 ENCOUNTER — Other Ambulatory Visit (HOSPITAL_BASED_OUTPATIENT_CLINIC_OR_DEPARTMENT_OTHER): Payer: Medicare Other

## 2017-07-02 ENCOUNTER — Telehealth: Payer: Self-pay | Admitting: Hematology

## 2017-07-02 VITALS — BP 129/75 | HR 64 | Temp 98.0°F | Resp 20 | Ht 59.0 in | Wt 119.5 lb

## 2017-07-02 DIAGNOSIS — C19 Malignant neoplasm of rectosigmoid junction: Secondary | ICD-10-CM

## 2017-07-02 DIAGNOSIS — R197 Diarrhea, unspecified: Secondary | ICD-10-CM

## 2017-07-02 DIAGNOSIS — C20 Malignant neoplasm of rectum: Secondary | ICD-10-CM

## 2017-07-02 DIAGNOSIS — Z862 Personal history of diseases of the blood and blood-forming organs and certain disorders involving the immune mechanism: Secondary | ICD-10-CM

## 2017-07-02 LAB — COMPREHENSIVE METABOLIC PANEL
ALT: 17 U/L (ref 0–55)
AST: 24 U/L (ref 5–34)
Albumin: 3.7 g/dL (ref 3.5–5.0)
Alkaline Phosphatase: 100 U/L (ref 40–150)
Anion Gap: 10 mEq/L (ref 3–11)
BUN: 14.1 mg/dL (ref 7.0–26.0)
CHLORIDE: 105 meq/L (ref 98–109)
CO2: 23 meq/L (ref 22–29)
CREATININE: 0.7 mg/dL (ref 0.6–1.1)
Calcium: 9.3 mg/dL (ref 8.4–10.4)
EGFR: >60 mL/min/{1.73_m2} (ref >60)
Glucose: 87 mg/dl (ref 70–140)
Potassium: 3.8 mEq/L (ref 3.5–5.1)
SODIUM: 138 meq/L (ref 136–145)
Total Bilirubin: 0.69 mg/dL (ref 0.20–1.20)
Total Protein: 6.9 g/dL (ref 6.4–8.3)

## 2017-07-02 LAB — CBC WITH DIFFERENTIAL/PLATELET
BASO%: 1.6 % (ref 0.0–2.0)
BASOS ABS: 0.1 10*3/uL (ref 0.0–0.1)
EOS ABS: 0.2 10*3/uL (ref 0.0–0.5)
EOS%: 3.1 % (ref 0.0–7.0)
HEMATOCRIT: 39.6 % (ref 34.8–46.6)
HGB: 13.4 g/dL (ref 11.6–15.9)
LYMPH%: 30.5 % (ref 14.0–49.7)
MCH: 34.9 pg — ABNORMAL HIGH (ref 25.1–34.0)
MCHC: 33.8 g/dL (ref 31.5–36.0)
MCV: 103.1 fL — AB (ref 79.5–101.0)
MONO#: 0.6 10*3/uL (ref 0.1–0.9)
MONO%: 10.1 % (ref 0.0–14.0)
NEUT#: 3.1 10*3/uL (ref 1.5–6.5)
NEUT%: 54.7 % (ref 38.4–76.8)
NRBC: 1 % — AB (ref 0–0)
PLATELETS: 287 10*3/uL (ref 145–400)
RBC: 3.84 10*6/uL (ref 3.70–5.45)
RDW: 24.8 % — ABNORMAL HIGH (ref 11.2–14.5)
WBC: 5.6 10*3/uL (ref 3.9–10.3)
lymph#: 1.7 10*3/uL (ref 0.9–3.3)

## 2017-07-02 LAB — CEA (IN HOUSE-CHCC): CEA (CHCC-In House): 5.1 ng/mL — ABNORMAL HIGH (ref 0.00–5.00)

## 2017-07-02 MED FILL — XELODA 500 MG TABLET: 500 | 21 days supply | Qty: 84 | Fill #5

## 2017-07-02 NOTE — Telephone Encounter (Signed)
Gave avs and calendar for January 2019

## 2017-07-02 NOTE — Progress Notes (Signed)
[place]  [phone] [phone]  Clinic Follow up Note   Patient Care Team: Patient, No Pcp Per as PCP - General (General Practice) Pyrtle, Lajuan Lines, MD as Consulting Physician (Gastroenterology) Leighton Ruff, MD as Consulting Physician (General Surgery) Kyung Rudd, MD as Consulting Physician (Radiation Oncology) Truitt Merle, MD as Consulting Physician (Hematology) Michael Boston, MD as Consulting Physician (General Surgery) 07/02/2017  CHIEF COMPLAINT: F/u rectosigmoid cancer  SUMMARY OF ONCOLOGIC HISTORY: Oncology History   Cancer Staging Rectal cancer [facility]) Staging form: Colon and Rectum, AJCC 8th Edition - Clinical stage from 10/14/2016: Stage IIIB (cT3, cN1, cM0) - Signed by Truitt Merle, MD on 10/24/2016       Rectal cancer ([place])   10/14/2016 Initial Diagnosis    Rectal cancer ([place])      10/14/2016 Procedure    Colonoscopy by Dr. Hilarie Fredrickson showed a fungating partially upchucking large mass in the rectosigmoid colon, the mass begins about 10 cm from the dentate line and measured 5 cm in length. Biopsy was obtained. Multiple small and largemouth diverticula were found in the sigmoid colon and the ascending colon.      10/14/2016 Initial Biopsy    Rectosigmoid mass biopsy showed adenocarcinoma       10/15/2016 Imaging    CT of chest, abdomen and pelvis with contrast showed a 4 cm annular colonic soft tissue mass near the rectosigmoid junction, consistent with known primary carcinoma. Several small her chronic lymph nodes measuring up to 39m, suspicious for local lymph node metastasis. No evidence of distant metastasis.      11/17/2016 - 12/25/2016 Radiation Therapy     Radiation Therapy by Dr. MLisbeth Renshaw     11/17/2016 - 12/08/2016 Chemotherapy    Concurrent chemo Xeloda (15052min am and 100077mn evening) to go along with radiation.   Hold chemo due to platelet count 12/08/16      02/18/2017 Surgery    XI ROBOTIC ASSISTED LOWER ANTERIOR  RESECTION WITH SPLENIC FLEXURE MOBILIZATION and INTRAOPERATIVE ASSESSMENT OF VASCULAR PERFUSION by Dr. ThoMarcello Moores   02/18/2017 Pathology Results    Diagnosis 02/18/17 1. Spleen - BENIGN SPLEEN (152 G) - NO MALIGNANCY IDENTIFIED 2. Colon, segmental resection for tumor, rectosigmoid - RESIDUAL ADENOCARCINOMA, MODERATELY DIFFERENTIATED - MARGINS UNINVOLVED BY CARCINOMA - NO CARCINOMA IDENTIFIED IN TEN LYMPH NODES (0/10) - SEE ONCOLOGY TABLE AND COMMENT BELOW 3. Rectum, resection, distal stump - BENIGN COLONIC MUCOSA - NO CARCINOMA IDENTIFIED IN ONE LYMPH NODE (0/1) 4. Colon, resection margin (donut), final distal - BENIGN COLONIC MUCOSA - NO CARCINOMA IDENTIFIED       03/23/2017 -  Chemotherapy    Xeloda 1500m50m2h, 2 weeks on and 1 weeks off, started on 03/23/17      CURRENT THERAPY: adjuvant xeloda 1500 mg Q12, 2 weeks on and 1 week off; started 03/23/17, for 6 cycles  INTERVAL HISTORY: [name] today for f/u as scheduled. She held xeloda for 2 days over thanksgiving holiday so she will complete cycle 5 on 12/4. Frequent bowel movements with periodic diarrhea, controlled with 1 imodium every 3 days; no nausea, mouth sores, or decreased appetite. Denies fatigue. Palms are slightly red and "slippery" but not painful or sensitive. No redness or pain to feet.   REVIEW OF SYSTEMS:   Constitutional: Denies fatigue, fevers, chills or abnormal weight loss Eyes: Denies blurriness of vision Ears, nose, mouth, throat, and face: Denies mucositis or sore throat Respiratory: Denies cough, dyspnea or wheezes Cardiovascular: Denies  palpitation, chest discomfort or lower extremity swelling Gastrointestinal:  Denies nausea, vomiting, constipation, heartburn or change in bowel habits (+) frequent BM with periodic diarrhea, well managed with 1 imodium q3 days  Skin: Denies abnormal skin rashes (+) mild redness to hands, no pain Lymphatics: Denies new lymphadenopathy or easy  bruising Neurological:Denies numbness, tingling or new weaknesses Behavioral/Psych: Mood is stable, no new changes  All other systems were reviewed with the patient and are negative.  MEDICAL HISTORY:  Past Medical History:  Diagnosis Date  . Cancer ([place]) 10/14/2016   rectal cancer-adenocarcinoma  . Clotting disorder ([place])   . Temporary low platelet count ([place])     SURGICAL HISTORY: Past Surgical History:  Procedure Laterality Date  . COLONOSCOPY     with propofol   . INTRAOPERATIVE CHOLANGIOGRAM  02/18/2017   Procedure: INTRAOPERATIVE ASSESSMENT OF VASCULAR PERFUSION;  Surgeon: Leighton Ruff, MD;  Location: [facility];  Service: General;;  . LAPAROSCOPIC SPLENECTOMY  02/18/2017   Procedure: XI ROBOTIC ASSISTED SPLENECTOMY;  Surgeon: Michael Boston, MD;  Location: [facility];  Service: General;;  . NO PAST SURGERIES    . XI ROBOTIC ASSISTED LOWER ANTERIOR RESECTION N/A 02/18/2017   Procedure: XI ROBOTIC ASSISTED LOWER ANTERIOR RESECTION WITH SPLENIC FLEXURE MOBILIZATION;  Surgeon: Leighton Ruff, MD;  Location: [facility];  Service: General;  Laterality: N/A;    I have reviewed the social history and family history with the patient and they are unchanged from previous note.  ALLERGIES:  has No Known Allergies.  MEDICATIONS:  Current Outpatient Medications  Medication Sig Dispense Refill  . capecitabine (XELODA) 500 MG tablet Take 3 tablets (1,500 mg total) by mouth 2 (two) times daily after a meal. Take for 14 days on, 7 days off 84 tablet 5  . cholecalciferol (VITAMIN D) 1000 units tablet Take 1 tablet by mouth daily.    Marland Kitchen ibuprofen (ADVIL,MOTRIN) 200 MG tablet Take 1-2 tablets (200-400 mg total) by mouth every 6 (six) hours as needed (for pain).    . Multiple Vitamins-Minerals (OCUVITE PO) Take 1 tablet by mouth daily.    . vitamin B-12 (CYANOCOBALAMIN) 1000 MCG tablet Take 1 tablet by mouth daily.     No current facility-administered medications for this visit.     PHYSICAL  EXAMINATION: ECOG PERFORMANCE STATUS: 1 - Symptomatic but completely ambulatory  Vitals:   07/02/17 1437  BP: 129/75  Pulse: 64  Resp: 20  Temp: 98 F (36.7 C)  SpO2: 100%   Filed Weights   07/02/17 1437  Weight: 119 lb 8 oz (54.2 kg)    GENERAL:alert, no distress and comfortable SKIN: skin color, texture, turgor are normal, no rashes or significant lesions EYES: normal, Conjunctiva are pink and non-injected, sclera clear OROPHARYNX:no exudate, no erythema and lips, buccal mucosa, and tongue normal  NECK: supple, thyroid normal size, non-tender, without nodularity LYMPH:  no palpable cervical, supraclavicular, axillary, or inguinal lymphadenopathy  LUNGS: clear to auscultation bilaterally with normal breathing effort HEART: regular rate & rhythm and no murmurs and no lower extremity edema ABDOMEN:abdomen soft, non-tender and normal bowel sounds. No palpable hepatomegaly Musculoskeletal:no cyanosis of digits and no clubbing (+) mild palmar erythema, no crack, peeling, or blisters NEURO: alert & oriented x 3 with fluent speech, no focal motor/sensory deficits  LABORATORY DATA:  I have reviewed the data as listed CBC Latest Ref Rng & Units 07/02/2017 06/11/2017 05/21/2017  WBC 3.9 - 10.3 10e3/uL 5.6 5.3 6.3  Hemoglobin 11.6 - 15.9 g/dL 13.4 13.5 13.1  Hematocrit 34.8 -  46.6 % 39.6 41.0 39.3  Platelets 145 - 400 10e3/uL 287 288 285     CMP Latest Ref Rng & Units 07/02/2017 06/11/2017 05/21/2017  Glucose 70 - 140 mg/dl 87 85 94  BUN 7.0 - 26.0 mg/dL 14.1 14.7 14.0  Creatinine 0.6 - 1.1 mg/dL 0.7 0.9 0.7  Sodium 136 - 145 mEq/L 138 142 143  Potassium 3.5 - 5.1 mEq/L 3.8 3.8 3.9  Chloride 101 - 111 mmol/L - - -  CO2 22 - 29 mEq/L 23 27 27   Calcium 8.4 - 10.4 mg/dL 9.3 9.6 9.4  Total Protein 6.4 - 8.3 g/dL 6.9 7.2 6.8  Total Bilirubin 0.20 - 1.20 mg/dL 0.69 0.67 0.52  Alkaline Phos 40 - 150 U/L 100 104 91  AST 5 - 34 U/L 24 25 18   ALT 0 - 55 U/L 17 18 11     RADIOGRAPHIC  STUDIES: I have personally reviewed the radiological images as listed and agreed with the findings in the report. No results found.   ASSESSMENT & PLAN: 66 y.o.  Caucasian female, with past medical history of ulcerative colitis, thrombocytopenia, presented with diarrhea, constipation, and persistent hematochezia.  1. Rectosigmoid adenocarcinoma, cT3N1M0, stage IIIB, ypT2N0M0, MMR normal  2. 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 History of ulcerative colitis  Allison Dunlap appears stable today, tolerating adjuvant xeloda well without significant GI or skin toxicities. She completed cycle 5 on 06/30/17. VS and weight stable. CBC unremarkable, plt normal. CEA slightly elevated to 5.10, has been normal in the past. Will monitor closely at next visit. Physical exam is unremarkable. She will begin 6th and final cycle 07/07/17. Will obtain surveillance CT after completion of adjuvant chemotherapy, ordered today, will f/u with results few days after, in 3-4 weeks.   PLAN: Labs reviewed, begin cycle 6 xeloda 1500 mg q12 2 weeks on, 1 week off 07/07/17 Surveillance CT CAP w contrast after completion of adjuvant chemo in 3-4 weeks, ordered today Lab and f/u in 4 weeks, few days after CT   Orders Placed This Encounter  Procedures  . CT Abdomen Pelvis W Contrast    Standing Status:   Future    Standing Expiration Date:   07/02/2018    Order Specific Question:   If indicated for the ordered procedure, I authorize the administration of contrast media per Radiology protocol    Answer:   Yes    Order Specific Question:   Preferred imaging location?    Answer:   [facility]    Order Specific Question:   Radiology Contrast Protocol - do NOT remove file path    Answer:   [email or web address]    Order Specific Question:   Reason for Exam additional comments    Answer:   rectosigmoid cancer s/p neoadjuvant chemoradiation, resection, and 6 cycle adjuvant  xeloda  . CT Chest W Contrast    Standing Status:   Future    Standing Expiration Date:   07/02/2018    Order Specific Question:   If indicated for the ordered procedure, I authorize the administration of contrast media per Radiology protocol    Answer:   Yes    Order Specific Question:   Preferred imaging location?    Answer:   [facility]    Order Specific Question:   Radiology Contrast Protocol - do NOT remove file path    Answer:   [email or web address]    Order Specific Question:   Reason for Exam additional comments    Answer:  rectosigmoid cancer s/p neoadjuvant chemoradiation, resection, and 6 cycle adjuvant xeloda   All questions were answered. The patient knows to call the clinic with any problems, questions or concerns. No barriers to learning was detected.     Alla Feeling, NP 07/02/17

## 2017-07-26 ENCOUNTER — Encounter: Payer: Self-pay | Admitting: Hematology

## 2017-07-29 ENCOUNTER — Telehealth: Payer: Self-pay | Admitting: *Deleted

## 2017-07-29 ENCOUNTER — Telehealth: Payer: Self-pay | Admitting: Hematology

## 2017-07-29 ENCOUNTER — Other Ambulatory Visit: Payer: Self-pay | Admitting: Hematology

## 2017-07-29 ENCOUNTER — Ambulatory Visit (HOSPITAL_COMMUNITY): Payer: Medicare HMO

## 2017-07-29 DIAGNOSIS — C20 Malignant neoplasm of rectum: Secondary | ICD-10-CM

## 2017-07-29 NOTE — Telephone Encounter (Signed)
Pt called this am reporting that CT was r/s to next week & wants to know if MD appt for tomorrow needs to be moved.  Discussed with Dr Burr Medico & agreed to move MD appt to after CT.  Pt notified & scheduling message sent.

## 2017-07-29 NOTE — Telephone Encounter (Signed)
Spoke with patient regarding appts that were changed per 1/2 los.

## 2017-07-30 ENCOUNTER — Other Ambulatory Visit: Payer: Medicare Other

## 2017-07-30 ENCOUNTER — Ambulatory Visit: Payer: Medicare Other | Admitting: Hematology

## 2017-08-05 ENCOUNTER — Ambulatory Visit (HOSPITAL_COMMUNITY)
Admission: RE | Admit: 2017-08-05 | Discharge: 2017-08-05 | Disposition: A | Discharge status: Home / Self Care | Payer: Medicare HMO | Source: Ambulatory Visit | Attending: Nurse Practitioner | Admitting: Nurse Practitioner

## 2017-08-05 ENCOUNTER — Encounter (HOSPITAL_COMMUNITY): Payer: Self-pay

## 2017-08-05 DIAGNOSIS — K7689 Other specified diseases of liver: Secondary | ICD-10-CM | POA: Diagnosis not present

## 2017-08-05 DIAGNOSIS — C20 Malignant neoplasm of rectum: Secondary | ICD-10-CM | POA: Diagnosis not present

## 2017-08-05 DIAGNOSIS — R918 Other nonspecific abnormal finding of lung field: Secondary | ICD-10-CM | POA: Diagnosis not present

## 2017-08-05 MED ORDER — IOPAMIDOL (ISOVUE-300) INJECTION 61%
INTRAVENOUS | Status: AC
  Filled 2017-08-05: qty 100

## 2017-08-05 MED ORDER — IOPAMIDOL (ISOVUE-300) INJECTION 61%
100.0000 mL | Freq: Once | INTRAVENOUS | Status: AC | PRN
  Administered 2017-08-05: 80 mL via INTRAVENOUS

## 2017-08-06 NOTE — Progress Notes (Signed)
Open in error

## 2017-08-07 ENCOUNTER — Inpatient Hospital Stay: Payer: Medicare HMO | Attending: Hematology

## 2017-08-07 ENCOUNTER — Inpatient Hospital Stay: Payer: Medicare HMO | Admitting: Hematology

## 2017-08-07 ENCOUNTER — Telehealth: Payer: Self-pay | Admitting: Hematology

## 2017-08-07 ENCOUNTER — Encounter: Payer: Self-pay | Admitting: Hematology

## 2017-08-07 VITALS — BP 139/82 | HR 63 | Temp 98.0°F | Resp 18 | Ht 59.0 in | Wt 121.3 lb

## 2017-08-07 DIAGNOSIS — Z9221 Personal history of antineoplastic chemotherapy: Secondary | ICD-10-CM

## 2017-08-07 DIAGNOSIS — Z87891 Personal history of nicotine dependence: Secondary | ICD-10-CM | POA: Insufficient documentation

## 2017-08-07 DIAGNOSIS — Z8719 Personal history of other diseases of the digestive system: Secondary | ICD-10-CM

## 2017-08-07 DIAGNOSIS — D693 Immune thrombocytopenic purpura: Secondary | ICD-10-CM

## 2017-08-07 DIAGNOSIS — K5730 Diverticulosis of large intestine without perforation or abscess without bleeding: Secondary | ICD-10-CM | POA: Diagnosis not present

## 2017-08-07 DIAGNOSIS — Z923 Personal history of irradiation: Secondary | ICD-10-CM | POA: Insufficient documentation

## 2017-08-07 DIAGNOSIS — Z79899 Other long term (current) drug therapy: Secondary | ICD-10-CM

## 2017-08-07 DIAGNOSIS — K921 Melena: Secondary | ICD-10-CM | POA: Insufficient documentation

## 2017-08-07 DIAGNOSIS — D696 Thrombocytopenia, unspecified: Secondary | ICD-10-CM | POA: Diagnosis not present

## 2017-08-07 DIAGNOSIS — R918 Other nonspecific abnormal finding of lung field: Secondary | ICD-10-CM

## 2017-08-07 DIAGNOSIS — Z9081 Acquired absence of spleen: Secondary | ICD-10-CM | POA: Insufficient documentation

## 2017-08-07 DIAGNOSIS — Z809 Family history of malignant neoplasm, unspecified: Secondary | ICD-10-CM | POA: Insufficient documentation

## 2017-08-07 DIAGNOSIS — K5900 Constipation, unspecified: Secondary | ICD-10-CM

## 2017-08-07 DIAGNOSIS — C20 Malignant neoplasm of rectum: Secondary | ICD-10-CM

## 2017-08-07 DIAGNOSIS — Z803 Family history of malignant neoplasm of breast: Secondary | ICD-10-CM | POA: Insufficient documentation

## 2017-08-07 DIAGNOSIS — E041 Nontoxic single thyroid nodule: Secondary | ICD-10-CM

## 2017-08-07 DIAGNOSIS — K7689 Other specified diseases of liver: Secondary | ICD-10-CM | POA: Insufficient documentation

## 2017-08-07 DIAGNOSIS — R197 Diarrhea, unspecified: Secondary | ICD-10-CM | POA: Insufficient documentation

## 2017-08-07 LAB — CBC WITH DIFFERENTIAL/PLATELET
Basophils Absolute: 0.1 10*3/uL (ref 0.0–0.1)
Basophils Relative: 2 %
Eosinophils Absolute: 0.2 10*3/uL (ref 0.0–0.5)
Eosinophils Relative: 3 %
HEMATOCRIT: 42.8 % (ref 34.8–46.6)
HEMOGLOBIN: 14.1 g/dL (ref 11.6–15.9)
LYMPHS ABS: 2 10*3/uL (ref 0.9–3.3)
LYMPHS PCT: 31 %
MCH: 36.6 pg — AB (ref 25.1–34.0)
MCHC: 32.9 g/dL (ref 31.5–36.0)
MCV: 111.2 fL — AB (ref 79.5–101.0)
Monocytes Absolute: 0.6 10*3/uL (ref 0.1–0.9)
Monocytes Relative: 9 %
NEUTROS ABS: 3.5 10*3/uL (ref 1.5–6.5)
NEUTROS PCT: 55 %
Platelets: 312 10*3/uL (ref 145–400)
RBC: 3.85 MIL/uL (ref 3.70–5.45)
RDW: 19.7 % — ABNORMAL HIGH (ref 11.2–16.1)
WBC: 6.3 10*3/uL (ref 3.9–10.3)

## 2017-08-07 LAB — CEA (IN HOUSE-CHCC): CEA (CHCC-IN HOUSE): 4.77 ng/mL (ref 0.00–5.00)

## 2017-08-07 LAB — COMPREHENSIVE METABOLIC PANEL
ALT: 31 U/L (ref 0–55)
AST: 35 U/L — ABNORMAL HIGH (ref 5–34)
Albumin: 4 g/dL (ref 3.5–5.0)
Alkaline Phosphatase: 128 U/L (ref 40–150)
Anion gap: 11 (ref 3–11)
BUN: 12 mg/dL (ref 7–26)
CHLORIDE: 104 mmol/L (ref 98–109)
CO2: 26 mmol/L (ref 22–29)
Calcium: 9.4 mg/dL (ref 8.4–10.4)
Creatinine, Ser: 0.77 mg/dL (ref 0.60–1.10)
GFR calc non Af Amer: >60 mL/min (ref >60)
Glucose, Bld: 83 mg/dL (ref 70–140)
POTASSIUM: 3.9 mmol/L (ref 3.3–4.7)
SODIUM: 141 mmol/L (ref 136–145)
Total Bilirubin: 0.6 mg/dL (ref 0.2–1.2)
Total Protein: 7.5 g/dL (ref 6.4–8.3)

## 2017-08-07 NOTE — Telephone Encounter (Signed)
Scheduled appt per 1/11 los - Gave patient AVS and calender per los.

## 2017-08-07 NOTE — Progress Notes (Signed)
[place]  [phone] [phone]  Clinic Follow Up Note   Patient Care Team: Patient, No Pcp Per as PCP - General (General Practice) Pyrtle, Lajuan Lines, MD as Consulting Physician (Gastroenterology) Leighton Ruff, MD as Consulting Physician (General Surgery) Kyung Rudd, MD as Consulting Physician (Radiation Oncology) Truitt Merle, MD as Consulting Physician (Hematology) Michael Boston, MD as Consulting Physician (General Surgery) 08/07/2017   CHIEF COMPLAINTS:  Follow up of diagnosed rectosigmoid cancer   Oncology History   Cancer Staging Rectal cancer [facility]) Staging form: Colon and Rectum, AJCC 8th Edition - Clinical stage from 10/14/2016: Stage IIIB (cT3, cN1, cM0) - Signed by Truitt Merle, MD on 10/24/2016       Rectal cancer ([place])   10/14/2016 Initial Diagnosis    Rectal cancer ([place])      10/14/2016 Procedure    Colonoscopy by Dr. Hilarie Fredrickson showed a fungating partially upchucking large mass in the rectosigmoid colon, the mass begins about 10 cm from the dentate line and measured 5 cm in length. Biopsy was obtained. Multiple small and largemouth diverticula were found in the sigmoid colon and the ascending colon.      10/14/2016 Initial Biopsy    Rectosigmoid mass biopsy showed adenocarcinoma       10/15/2016 Imaging    CT of chest, abdomen and pelvis with contrast showed a 4 cm annular colonic soft tissue mass near the rectosigmoid junction, consistent with known primary carcinoma. Several small her chronic lymph nodes measuring up to 63m, suspicious for local lymph node metastasis. No evidence of distant metastasis.      11/17/2016 - 12/25/2016 Radiation Therapy     Radiation Therapy by Dr. MLisbeth Renshaw     11/17/2016 - 12/08/2016 Chemotherapy    Concurrent chemo Xeloda (15065min am and 100014mn evening) to go along with radiation.   Hold chemo due to platelet count 12/08/16      02/18/2017 Surgery    XI ROBOTIC ASSISTED LOWER ANTERIOR  RESECTION WITH SPLENIC FLEXURE MOBILIZATION and INTRAOPERATIVE ASSESSMENT OF VASCULAR PERFUSION by Dr. ThoMarcello Moores   02/18/2017 Pathology Results    Diagnosis 02/18/17 1. Spleen - BENIGN SPLEEN (152 G) - NO MALIGNANCY IDENTIFIED 2. Colon, segmental resection for tumor, rectosigmoid - RESIDUAL ADENOCARCINOMA, MODERATELY DIFFERENTIATED - MARGINS UNINVOLVED BY CARCINOMA - NO CARCINOMA IDENTIFIED IN TEN LYMPH NODES (0/10) - SEE ONCOLOGY TABLE AND COMMENT BELOW 3. Rectum, resection, distal stump - BENIGN COLONIC MUCOSA - NO CARCINOMA IDENTIFIED IN ONE LYMPH NODE (0/1) 4. Colon, resection margin (donut), final distal - BENIGN COLONIC MUCOSA - NO CARCINOMA IDENTIFIED       03/23/2017 -  Chemotherapy    Xeloda 1500m63m2h, 2 weeks on and 1 weeks off, started on 03/23/17       08/05/2017 Imaging    CT CAP W Contrast  IMPRESSION: 1. Interval resection of rectal mass without evidence for metastatic disease in the chest, abdomen, or pelvis. 2. 6 mm nodular opacity in the posterior right costophrenic sulcus. 3. Stable hepatic cysts.       HISTORY OF PRESENTING ILLNESS:  [name]67. female is here because of Her recently diagnosed rectosigmoid colon cancer. She was referred by her gastroenterologist Dr. PyrtHilarie Fredricksone presents to my clinic by herself today.  She has been having constipation and diarrhea for 6 months, and daily hematochezia, a tea spoon each time, no rectal pain, no bloating, nausea or other symptoms, no change of appetite and weight. She does not have  a primary care physician, and self-referred to the [facility] gastroenterology. She was seen by Dr. Hilarie Fredrickson. She underwent colonoscopy, which showed a large mass in the rectosigmoid junction. Biopsy showed adenocarcinoma.  She was diagnosed with ulcerative colitis in her 20's, resolved and now.  She has had thrombocytopenia since 2013, she has bone marrow biopsy, which was normal per pt, never required any treatment    She is  widowed, no children, has one brother in [place], she is not working   CURRENT THERAPY: Adjuvant Xeloda 1581m q12h, 2 weeks on and 1 weeks off, started on 03/23/17, plan for 6 cycles   INTERVAL HISTORY:  RKaileah Shevchenkoreturns today for follow up. She presents to the clinic today noting no significant issue from Xeloda. She notes that she completed her last cycle of Xeloda on 07/20/2017.   Of note since the patient last visit, she has had CT CAP completed on 08/05/17 with results revealing Interval resection of rectal mass without evidence for metastatic disease in the chest, abdomen, or pelvis, a 6 mm nodular opacity in the posterior right costophrenic sulcus and stable hepatic cysts..    On review of systems, pt reports up to 20 firm, small stools daily clustered around meals. She notes that she has mild leakage in the morning intermittently. She took imodium yesterday for her symptoms. She discontinued use of imodium following stopped using xeloda. She denies diarrhea and any other symptoms.        MEDICAL HISTORY:  Past Medical History:  Diagnosis Date  . [place]   rectal cancer-adenocarcinoma  . Clotting disorder ([place]   . Temporary low platelet count ([facility]     SURGICAL HISTORY: Past Surgical History:  Procedure Laterality Date  . COLONOSCOPY     with propofol   . INTRAOPERATIVE CHOLANGIOGRAM  02/18/2017   Procedure: INTRAOPERATIVE ASSESSMENT OF VASCULAR PERFUSION;  Surgeon: TLeighton Ruff MD;  Location: [facility];  Service: General;;  . LAPAROSCOPIC SPLENECTOMY  02/18/2017   Procedure: XI ROBOTIC ASSISTED SPLENECTOMY;  Surgeon: GMichael Boston MD;  Location: [facility];  Service: General;;  . NO PAST SURGERIES    . XI ROBOTIC ASSISTED LOWER ANTERIOR RESECTION N/A 02/18/2017   Procedure: XI ROBOTIC ASSISTED LOWER ANTERIOR RESECTION WITH SPLENIC FLEXURE MOBILIZATION;  Surgeon: TLeighton Ruff MD;  Location: [facility];  Service: General;  Laterality: N/A;    SOCIAL  HISTORY: Social History   Socioeconomic History  . Marital status: Widowed    Spouse name: Not on file  . Number of children: 0  . Years of education: Not on file  . Highest education level: Not on file  Social Needs  . Financial resource strain: Not on file  . Food insecurity - worry: Not on file  . Food insecurity - inability: Not on file  . Transportation needs - medical: Not on file  . Transportation needs - non-medical: Not on file  Occupational History  . Occupation: retired  Tobacco Use  . Smoking status: Former Smoker    Packs/day: 0.50    Years: 30.00    Pack years: 15.00    Last attempt to quit: 07/28/1997    Years since quitting: 20.0  . Smokeless tobacco: Never Used  Substance and Sexual Activity  . Alcohol use: Yes    Alcohol/week: 4.2 oz    Types: 7 Glasses of wine per week    Comment: wine and beer  . Drug use: No  . Sexual activity: Not on file  Other Topics Concern  .  Not on file  Social History Narrative  . Not on file    FAMILY HISTORY: Family History  Problem Relation Age of Onset  . Alzheimer's disease Mother   . Liver disease Father 63       failure  . Breast cancer Sister   . Cancer Sister 52       breast cancer   . Heart disease Maternal Grandfather   . Cancer Paternal Grandmother        type unknown    ALLERGIES:  has No Known Allergies.  MEDICATIONS:  Current Outpatient Medications  Medication Sig Dispense Refill  . cholecalciferol (VITAMIN D) 1000 units tablet Take 1 tablet by mouth daily.    . Multiple Vitamins-Minerals (OCUVITE PO) Take 1 tablet by mouth daily.    . capecitabine (XELODA) 500 MG tablet Take 3 tablets (1,500 mg total) by mouth 2 (two) times daily after a meal. Take for 14 days on, 7 days off (Patient not taking: Reported on 08/07/2017) 84 tablet 5  . ibuprofen (ADVIL,MOTRIN) 200 MG tablet Take 1-2 tablets (200-400 mg total) by mouth every 6 (six) hours as needed (for pain). (Patient not taking: Reported on 08/07/2017)     . vitamin B-12 (CYANOCOBALAMIN) 1000 MCG tablet Take 1 tablet by mouth daily.     No current facility-administered medications for this visit.     REVIEW OF SYSTEMS:  Constitutional: Denies fevers, chills or abnormal night sweats Eyes: Denies blurriness of vision, double vision or watery eyes Ears, nose, mouth, throat, and face: Denies mucositis or sore throat Respiratory: Denies cough, dyspnea or wheezes Cardiovascular: Denies palpitation, chest discomfort or lower extremity swelling Gastrointestinal:  Denies heartburn, No nausea or change of bowel habits (+) occasional diarrhea Skin: No skin rash Lymphatics: Denies new lymphadenopathy or easy bruising Neurological:Denies numbness, tingling or new weaknesses Behavioral/Psych: Mood is stable, no new changes  All other systems were reviewed with the patient and are negative.  PHYSICAL EXAMINATION:  ECOG PERFORMANCE STATUS: 0 06/11/17 Vitals: W: 116.3 lbs T: 98 Ox: 100% P: 65 R: 18 BP: 118/76 GENERAL:alert, no distress and comfortable SKIN: skin color, texture, turgor are normal,  or significant lesions  EYES: normal, conjunctiva are pink and non-injected, sclera clear OROPHARYNX:no exudate, no erythema and lips, buccal mucosa, and tongue normal  NECK: supple, thyroid normal size, non-tender, without nodularity LYMPH:  no palpable lymphadenopathy in the cervical, axillary or inguinal LUNGS: clear to auscultation and percussion with normal breathing effort HEART: regular rate & rhythm and no murmurs and no lower extremity edema ABDOMEN:abdomen soft, non-tender and normal bowel sounds (+) multiple small incision in abdomen and 3 cm incision below umbilical, all healed well.  Musculoskeletal:no cyanosis of digits and no clubbing  PSYCH: alert & oriented x 3 with fluent speech NEURO: no focal motor/sensory deficits  LABORATORY DATA:  I have reviewed the data as listed CBC Latest Ref Rng & Units 08/07/2017 07/02/2017 06/11/2017  WBC  3.9 - 10.3 K/uL 6.3 5.6 5.3  Hemoglobin 11.6 - 15.9 g/dL 14.1 13.4 13.5  Hematocrit 34.8 - 46.6 % 42.8 39.6 41.0  Platelets 145 - 400 K/uL 312 287 288   CMP Latest Ref Rng & Units 08/07/2017 07/02/2017 06/11/2017  Glucose 70 - 140 mg/dL 83 87 85  BUN 7 - 26 mg/dL 12 14.1 14.7  Creatinine 0.60 - 1.10 mg/dL 0.77 0.7 0.9  Sodium 136 - 145 mmol/L 141 138 142  Potassium 3.3 - 4.7 mmol/L 3.9 3.8 3.8  Chloride 98 - 109 mmol/L  104 - -  CO2 22 - 29 mmol/L 26 23 27   Calcium 8.4 - 10.4 mg/dL 9.4 9.3 9.6  Total Protein 6.4 - 8.3 g/dL 7.5 6.9 7.2  Total Bilirubin 0.2 - 1.2 mg/dL 0.6 0.69 0.67  Alkaline Phos 40 - 150 U/L 128 100 104  AST 5 - 34 U/L 35(H) 24 25  ALT 0 - 55 U/L 31 17 18    PATHOLOGY REPORT   Diagnosis 02/18/17 1. Spleen - BENIGN SPLEEN (152 G) - NO MALIGNANCY IDENTIFIED 2. Colon, segmental resection for tumor, rectosigmoid - RESIDUAL ADENOCARCINOMA, MODERATELY DIFFERENTIATED - MARGINS UNINVOLVED BY CARCINOMA - NO CARCINOMA IDENTIFIED IN TEN LYMPH NODES (0/10) - SEE ONCOLOGY TABLE AND COMMENT BELOW 3. Rectum, resection, distal stump - BENIGN COLONIC MUCOSA - NO CARCINOMA IDENTIFIED IN ONE LYMPH NODE (0/1) 4. Colon, resection margin (donut), final distal - BENIGN COLONIC MUCOSA - NO CARCINOMA IDENTIFIED Microscopic Comment 2. COLON AND RECTUM (INCLUDING TRANS-ANAL RESECTION): Specimen: Rectosigmoid colon Procedure: Low anterior resection Tumor site: Rectum Specimen integrity: Intact Macroscopic intactness of mesorectum: Complete Macroscopic tumor perforation: Not identified Invasive tumor: Maximum size: 0.4 cm Histologic type(s): Adenocarcinoma Histologic grade and differentiation: G2 (moderately differentiated/low grade) Microscopic extension of invasive tumor: Tumor invades muscularis propria Lymph-Vascular invasion: Not identified Peri-neural invasion: Not identified Microscopic Comment(continued) Tumor deposit(s): Not identified Resection margins: Uninvolved by  carcinoma Distance closest margin: Distal (2 cm) Treatment effect: Single cells or rare small groups of cancer cells (near complete response, score 1) Lymph nodes: number examined 11; number positive: 0 (See also Part 3) Pathologic Staging: ypT2, ypN0 (see comment) Ancillary studies: Available upon request (Block 2C) Comment: There is a small fibrotic nodule with necrosis, calcifications and foreign body giant cell reaction which may represent treatment effect within a tumor nodule or lymph node.   Diagnosis 10/14/2016 Colon, biopsy, rectosigmoid tumor - ADENOCARCINOMA.  RADIOGRAPHIC STUDIES: I have personally reviewed the radiological images as listed and agreed with the findings in the report. Ct Chest W Contrast  Result Date: 08/05/2017 CLINICAL DATA:  Rectal cancer. EXAM: CT CHEST, ABDOMEN, AND PELVIS WITH CONTRAST TECHNIQUE: Multidetector CT imaging of the chest, abdomen and pelvis was performed following the standard protocol during bolus administration of intravenous contrast. CONTRAST:  53m ISOVUE-300 IOPAMIDOL (ISOVUE-300) INJECTION 61% COMPARISON:  10/15/2016 FINDINGS: CT CHEST FINDINGS Cardiovascular: The heart size is normal. No pericardial effusion. No thoracic aortic aneurysm. Mediastinum/Nodes: No mediastinal lymphadenopathy. There is no hilar lymphadenopathy. The esophagus has normal imaging features. There is no axillary lymphadenopathy. Stable 10 mm left thyroid nodule. Lungs/Pleura: 6 mm nodule identified posterior right costophrenic sulcus, potentially atelectatic. Calcified granuloma identified posterior right upper lobe no focal airspace consolidation. No pulmonary edema or pleural effusion. Musculoskeletal: Bone windows reveal no worrisome lytic or sclerotic osseous lesions. CT ABDOMEN PELVIS FINDINGS Hepatobiliary: Multiple cysts are again seen in the liver. Gallbladder decompressed. Similar appearance of borderline enlarged common bile duct. Pancreas: No focal mass lesion.  No dilatation of the main duct. No intraparenchymal cyst. No peripancreatic edema. Spleen: Spleen is surgically absent. Adrenals/Urinary Tract: No adrenal nodule or mass. Kidneys unremarkable. No evidence for hydroureter. The urinary bladder appears normal for the degree of distention. Stomach/Bowel: Stomach is nondistended. No gastric wall thickening. No evidence of outlet obstruction. Duodenum is normally positioned as is the ligament of Treitz. No small bowel wall thickening. No small bowel dilatation. The terminal ileum is normal. The appendix is normal. No gross colonic mass. No colonic wall thickening. No substantial diverticular change. Suture line visible low rectosigmoid  junction. Small volume abnormal presacral soft tissue is likely postsurgical. ascular/Lymphatic: No abdominal aortic aneurysm. No abdominal aortic atherosclerotic calcification. There is no gastrohepatic or hepatoduodenal ligament lymphadenopathy. No intraperitoneal or retroperitoneal lymphadenopathy. No pelvic sidewall lymphadenopathy. Reproductive: The uterus has normal CT imaging appearance. There is no adnexal mass. Other: No intraperitoneal free fluid. Musculoskeletal: Bone windows reveal no worrisome lytic or sclerotic osseous lesions. IMPRESSION: 1. Interval resection of rectal mass without evidence for metastatic disease in the chest, abdomen, or pelvis. 2. 6 mm nodular opacity in the posterior right costophrenic sulcus. 3. Stable hepatic cysts. Electronically Signed   By: Misty Stanley M.D.   On: 08/05/2017 15:46   Ct Abdomen Pelvis W Contrast  Result Date: 08/05/2017 CLINICAL DATA:  Rectal cancer. EXAM: CT CHEST, ABDOMEN, AND PELVIS WITH CONTRAST TECHNIQUE: Multidetector CT imaging of the chest, abdomen and pelvis was performed following the standard protocol during bolus administration of intravenous contrast. CONTRAST:  28m ISOVUE-300 IOPAMIDOL (ISOVUE-300) INJECTION 61% COMPARISON:  10/15/2016 FINDINGS: CT CHEST FINDINGS  Cardiovascular: The heart size is normal. No pericardial effusion. No thoracic aortic aneurysm. Mediastinum/Nodes: No mediastinal lymphadenopathy. There is no hilar lymphadenopathy. The esophagus has normal imaging features. There is no axillary lymphadenopathy. Stable 10 mm left thyroid nodule. Lungs/Pleura: 6 mm nodule identified posterior right costophrenic sulcus, potentially atelectatic. Calcified granuloma identified posterior right upper lobe no focal airspace consolidation. No pulmonary edema or pleural effusion. Musculoskeletal: Bone windows reveal no worrisome lytic or sclerotic osseous lesions. CT ABDOMEN PELVIS FINDINGS Hepatobiliary: Multiple cysts are again seen in the liver. Gallbladder decompressed. Similar appearance of borderline enlarged common bile duct. Pancreas: No focal mass lesion. No dilatation of the main duct. No intraparenchymal cyst. No peripancreatic edema. Spleen: Spleen is surgically absent. Adrenals/Urinary Tract: No adrenal nodule or mass. Kidneys unremarkable. No evidence for hydroureter. The urinary bladder appears normal for the degree of distention. Stomach/Bowel: Stomach is nondistended. No gastric wall thickening. No evidence of outlet obstruction. Duodenum is normally positioned as is the ligament of Treitz. No small bowel wall thickening. No small bowel dilatation. The terminal ileum is normal. The appendix is normal. No gross colonic mass. No colonic wall thickening. No substantial diverticular change. Suture line visible low rectosigmoid junction. Small volume abnormal presacral soft tissue is likely postsurgical. ascular/Lymphatic: No abdominal aortic aneurysm. No abdominal aortic atherosclerotic calcification. There is no gastrohepatic or hepatoduodenal ligament lymphadenopathy. No intraperitoneal or retroperitoneal lymphadenopathy. No pelvic sidewall lymphadenopathy. Reproductive: The uterus has normal CT imaging appearance. There is no adnexal mass. Other: No  intraperitoneal free fluid. Musculoskeletal: Bone windows reveal no worrisome lytic or sclerotic osseous lesions. IMPRESSION: 1. Interval resection of rectal mass without evidence for metastatic disease in the chest, abdomen, or pelvis. 2. 6 mm nodular opacity in the posterior right costophrenic sulcus. 3. Stable hepatic cysts. Electronically Signed   By: EMisty StanleyM.D.   On: 08/05/2017 15:46    CT CAP W Contrast 08/05/17 IMPRESSION: 1. Interval resection of rectal mass without evidence for metastatic disease in the chest, abdomen, or pelvis. 2. 6 mm nodular opacity in the posterior right costophrenic sulcus. 3. Stable hepatic cysts.  CT CAP W Contrast: 10/15/16 IMPRESSION: 4 cm annular colonic soft tissue mass near the rectosigmoid junction, consistent with known primary carcinoma. Several small pericolonic lymph nodes measuring up to 10 mm, suspicious for local lymph node metastases. No evidence of distant metastatic disease. Proximal sigmoid diverticulosis.  No evidence of diverticulitis.  Colonoscopy 10/14/2016 - The perianal exam findings include a perianal fungal rash. -  The digital rectal exam was normal. - The terminal ileum appeared normal. - A fungating partially obstructing large mass was found in the recto-sigmoid colon. The mass was circumferential. The mass begins about 10 cm from the dentate line and measured five cm in length. Oozing was present. Multiple biopsies were obtained with cold forceps for histology in a targeted manner. Area proximal to the tumor was tattooed on opposite walls with an injection of 3 mL total of Spot (carbon black). - Multiple small and large-mouthed diverticula were found in the sigmoid colon and descending colon. - The retroflexed view of the distal rectum and anal verge was normal and showed no anal or rectal abnormalities.  ASSESSMENT & PLAN: 67 y.o.  Caucasian female, with past medical history of ulcerative colitis, thrombocytopenia,  presented with diarrhea, constipation, and persistent hematochezia.  1. Rectosigmoid adenocarcinoma, cT3N1M0, stage IIIB, ypT2N0M0, MMR normal  -I previously reviewed his CT scan findings, colonoscopy and biopsy pathology results in great details with patient. -The tumor is located in the proximal rectum and rectosigmoid junction. -Prior CT scan showed several perirectal lymph nodes, suspicious for node metastasis. She likely has locally advanced disease. -I previously presented her case in our GI tumor Board. Due to the location of the cancer, Dr. Marcello Moores recommends neoadjuvant chemotherapy and radiation, followed by surgical resection. -Tumor Board feels her disease is likely T3 N1 based on CT scan findings, EUS or MRI for staging is probably not needed. 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 -She had a great response to radiation and chemotherapy, her residual tumor was T2, nodes were negative.   -We previously discussed the surgical pathology results in detail. -Given her excellent response to neoadjuvant radiation and chemotherapy, she has minimal residual disease (near complete response), nodes negative, I previously  recommend her to take adjuvant Xeloda for 4-5 months.  - She started Xeloda 03/23/17 and has been tolerating very well, and completed 6 cycles on 06/2017.  -I will follow up with her next month then every 3-4 months for the next year then every 6 months for a total of 5 years surveillance.  - CT CAP from 08/05/17 reveals interval resection of rectal mass without evidence for metastatic disease in the chest, abdomen, or pelvis, a 6 mm nodular opacity in the posterior right costophrenic sulcus, and stable hepatic cysts. Discussed and reviewed results with pt today. The patient was able to ask questions regarding  her results during her visit.   -CEA at 4.77 today, 08/06/17 -Repeat scan in early 2020 -Lab and f/u in 3 months   2. 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 This is most consistent with ITP.  --He developed severe some cytopenia after low-dose chemotherapy Xeloda which was held for her new adjuvant treatment. -She is now status post splenectomy, had a great response, her thrombopenia has resolved. -Continue monitoring, she has not had any thrombocytopenia from adjuvant Xeloda. -Platelets WNL today, 08/06/17   3. History of ulcerative colitis -follow up with Dr. Hilarie Fredrickson  -We previously discussed her that her colitis may exacerbate during the radiation and chemotherapy. -She may use Imodium as needed for diarrhea, she'll follow-up with Dr. Hilarie Fredrickson if needed. -Advised the patient regarding pelvic floor exercises to  aid with increased bowel movements.  -Advised the patient regarding physical therapy pelvic health classes, to which the patient declined today  Plan Lab and f/u in 3 months      Orders Placed This Encounter  Procedures  . CEA (IN HOUSE-[facility])    Standing Status:   Standing    Number of Occurrences:   20    Standing Expiration Date:   08/07/2022    All questions were answered. The patient knows to call the clinic with any problems, questions or concerns.  I spent 20 minutes counseling the patient face to face. The total time spent in the appointment was 25 minutes and more than 50% was on counseling.  This document serves as a record of services personally performed by Truitt Merle, MD. It was created on her behalf by Theresia Bough, a trained medical scribe. The creation of this record is based on the scribe's personal observations and the provider's statements to them.   I have reviewed the above documentation for accuracy and completeness,  and I agree with the above.       Truitt Merle, MD 08/07/2017

## 2017-10-19 ENCOUNTER — Encounter: Payer: Self-pay | Admitting: Hematology

## 2017-10-19 ENCOUNTER — Encounter: Payer: Self-pay | Admitting: Internal Medicine

## 2017-11-04 NOTE — Progress Notes (Signed)
[place]  [phone] [phone]  Clinic Follow Up Note   Patient Care Team: Patient, No Pcp Per as PCP - General (General Practice) Pyrtle, Lajuan Lines, MD as Consulting Physician (Gastroenterology) Leighton Ruff, MD as Consulting Physician (General Surgery) Kyung Rudd, MD as Consulting Physician (Radiation Oncology) Truitt Merle, MD as Consulting Physician (Hematology) Michael Boston, MD as Consulting Physician (General Surgery)   Date of Service:  11/05/2017   CHIEF COMPLAINTS:  Follow up rectosigmoid cancer   Oncology History   Cancer Staging Rectal cancer [facility]) Staging form: Colon and Rectum, AJCC 8th Edition - Clinical stage from 10/14/2016: Stage IIIB (cT3, cN1, cM0) - Signed by Truitt Merle, MD on 10/24/2016       Rectal cancer ([place])   10/14/2016 Initial Diagnosis    Rectal cancer ([place])      10/14/2016 Procedure    Colonoscopy by Dr. Hilarie Fredrickson showed a fungating partially upchucking large mass in the rectosigmoid colon, the mass begins about 10 cm from the dentate line and measured 5 cm in length. Biopsy was obtained. Multiple small and largemouth diverticula were found in the sigmoid colon and the ascending colon.      10/14/2016 Initial Biopsy    Rectosigmoid mass biopsy showed adenocarcinoma       10/15/2016 Imaging    CT of chest, abdomen and pelvis with contrast showed a 4 cm annular colonic soft tissue mass near the rectosigmoid junction, consistent with known primary carcinoma. Several small her chronic lymph nodes measuring up to 33m, suspicious for local lymph node metastasis. No evidence of distant metastasis.      11/17/2016 - 12/25/2016 Radiation Therapy     Radiation Therapy by Dr. MLisbeth Renshaw     11/17/2016 - 12/08/2016 Chemotherapy    Concurrent chemo Xeloda (15053min am and 100087mn evening) to go along with radiation.   Hold chemo due to platelet count 12/08/16      02/18/2017 Surgery    XI ROBOTIC ASSISTED LOWER ANTERIOR  RESECTION WITH SPLENIC FLEXURE MOBILIZATION and INTRAOPERATIVE ASSESSMENT OF VASCULAR PERFUSION by Dr. ThoMarcello Moores   02/18/2017 Pathology Results    Diagnosis 02/18/17 1. Spleen - BENIGN SPLEEN (152 G) - NO MALIGNANCY IDENTIFIED 2. Colon, segmental resection for tumor, rectosigmoid - RESIDUAL ADENOCARCINOMA, MODERATELY DIFFERENTIATED - MARGINS UNINVOLVED BY CARCINOMA - NO CARCINOMA IDENTIFIED IN TEN LYMPH NODES (0/10) - SEE ONCOLOGY TABLE AND COMMENT BELOW 3. Rectum, resection, distal stump - BENIGN COLONIC MUCOSA - NO CARCINOMA IDENTIFIED IN ONE LYMPH NODE (0/1) 4. Colon, resection margin (donut), final distal - BENIGN COLONIC MUCOSA - NO CARCINOMA IDENTIFIED       03/23/2017 - 07/20/2017 Chemotherapy    Xeloda 1500m87m2h, 2 weeks on and 1 weeks off, started on 03/23/17 and comeplted 6 cycles on 07/20/17       08/05/2017 Imaging    CT CAP W Contrast  IMPRESSION: 1. Interval resection of rectal mass without evidence for metastatic disease in the chest, abdomen, or pelvis. 2. 6 mm nodular opacity in the posterior right costophrenic sulcus. 3. Stable hepatic cysts.       HISTORY OF PRESENTING ILLNESS:  Allison Altmanny67. female is here because of Her recently diagnosed rectosigmoid colon cancer. She was referred by her gastroenterologist Dr. PyrtHilarie Fredricksone presents to my clinic by herself today.  She has been having constipation and diarrhea for 6 months, and daily hematochezia, a tea spoon each time, no rectal pain, no bloating, nausea or other symptoms,  no change of appetite and weight. She does not have a primary care physician, and self-referred to the [facility] gastroenterology. She was seen by Dr. Hilarie Fredrickson. She underwent colonoscopy, which showed a large mass in the rectosigmoid junction. Biopsy showed adenocarcinoma.  She was diagnosed with ulcerative colitis in her 20's, resolved and now.  She has had thrombocytopenia since 2013, she has bone marrow biopsy, which was normal per  pt, never required any treatment    She is widowed, no children, has one brother in [place], she is not working   CURRENT THERAPY: Surveillance   INTERVAL HISTORY:  Fancy Dunkley returns today for follow up. She presents to the clinic today noting no significant issues. She has been active with weighting lifting 1-2 times a week at a gym. She still rides her horse. She is overall doing well.    On review of symptoms, pt notes her appetite and eating is well. She denies blood in stool or pain. She has small and few BMs.     MEDICAL HISTORY:  Past Medical History:  Diagnosis Date  . Cancer ([place]) 10/14/2016   rectal cancer-adenocarcinoma  . Clotting disorder ([place])   . Temporary low platelet count ([place])     SURGICAL HISTORY: Past Surgical History:  Procedure Laterality Date  . COLONOSCOPY     with propofol   . INTRAOPERATIVE CHOLANGIOGRAM  02/18/2017   Procedure: INTRAOPERATIVE ASSESSMENT OF VASCULAR PERFUSION;  Surgeon: Leighton Ruff, MD;  Location: [facility];  Service: General;;  . LAPAROSCOPIC SPLENECTOMY  02/18/2017   Procedure: XI ROBOTIC ASSISTED SPLENECTOMY;  Surgeon: Michael Boston, MD;  Location: [facility];  Service: General;;  . NO PAST SURGERIES    . XI ROBOTIC ASSISTED LOWER ANTERIOR RESECTION N/A 02/18/2017   Procedure: XI ROBOTIC ASSISTED LOWER ANTERIOR RESECTION WITH SPLENIC FLEXURE MOBILIZATION;  Surgeon: Leighton Ruff, MD;  Location: [facility];  Service: General;  Laterality: N/A;    SOCIAL HISTORY: Social History   Socioeconomic History  . Marital status: Widowed    Spouse name: Not on file  . Number of children: 0  . Years of education: Not on file  . Highest education level: Not on file  Occupational History  . Occupation: retired  Scientific laboratory technician  . Financial resource strain: Not on file  . Food insecurity:    Worry: Not on file    Inability: Not on file  . Transportation needs:    Medical: Not on file    Non-medical: Not on file  Tobacco Use  . Smoking  status: Former Smoker    Packs/day: 0.50    Years: 30.00    Pack years: 15.00    Last attempt to quit: 07/28/1997    Years since quitting: 20.2  . Smokeless tobacco: Never Used  Substance and Sexual Activity  . Alcohol use: Yes    Alcohol/week: 4.2 oz    Types: 7 Glasses of wine per week    Comment: wine and beer  . Drug use: No  . Sexual activity: Not on file  Lifestyle  . Physical activity:    Days per week: Not on file    Minutes per session: Not on file  . Stress: Not on file  Relationships  . Social connections:    Talks on phone: Not on file    Gets together: Not on file    Attends religious service: Not on file    Active member of club or organization: Not on file    Attends meetings of clubs or  organizations: Not on file    Relationship status: Not on file  . Intimate partner violence:    Fear of current or ex partner: Not on file    Emotionally abused: Not on file    Physically abused: Not on file    Forced sexual activity: Not on file  Other Topics Concern  . Not on file  Social History Narrative  . Not on file    FAMILY HISTORY: Family History  Problem Relation Age of Onset  . Alzheimer's disease Mother   . Liver disease Father 16       failure  . Breast cancer Sister   . Cancer Sister 58       breast cancer   . Heart disease Maternal Grandfather   . Cancer Paternal Grandmother        type unknown    ALLERGIES:  has No Known Allergies.  MEDICATIONS:  Current Outpatient Medications  Medication Sig Dispense Refill  . capecitabine (XELODA) 500 MG tablet Take 3 tablets (1,500 mg total) by mouth 2 (two) times daily after a meal. Take for 14 days on, 7 days off (Patient not taking: Reported on 08/07/2017) 84 tablet 5  . cholecalciferol (VITAMIN D) 1000 units tablet Take 1 tablet by mouth daily.    Marland Kitchen ibuprofen (ADVIL,MOTRIN) 200 MG tablet Take 1-2 tablets (200-400 mg total) by mouth every 6 (six) hours as needed (for pain). (Patient not taking: Reported on  08/07/2017)    . Multiple Vitamins-Minerals (OCUVITE PO) Take 1 tablet by mouth daily.    . vitamin B-12 (CYANOCOBALAMIN) 1000 MCG tablet Take 1 tablet by mouth daily.     No current facility-administered medications for this visit.     REVIEW OF SYSTEMS:  Constitutional: Denies fevers, chills or abnormal night sweats Eyes: Denies blurriness of vision, double vision or watery eyes Ears, nose, mouth, throat, and face: Denies mucositis or sore throat Respiratory: Denies cough, dyspnea or wheezes Cardiovascular: Denies palpitation, chest discomfort or lower extremity swelling Gastrointestinal:  Denies heartburn, No nausea or change of bowel habits  Skin: No skin rash Lymphatics: Denies new lymphadenopathy or easy bruising Neurological:Denies numbness, tingling or new weaknesses Behavioral/Psych: Mood is stable, no new changes  All other systems were reviewed with the patient and are negative.  PHYSICAL EXAMINATION:  ECOG PERFORMANCE STATUS: 0 Vitals:   11/05/17 1112  BP: 124/71  Pulse: 62  Resp: 18  Temp: 97.8 F (36.6 C)  TempSrc: Oral  SpO2: 100%  Weight: 121 lb 4.8 oz (55 kg)  Height: 4' 11"  (1.499 m)    GENERAL:alert, no distress and comfortable SKIN: skin color, texture, turgor are normal,  or significant lesions  EYES: normal, conjunctiva are pink and non-injected, sclera clear OROPHARYNX:no exudate, no erythema and lips, buccal mucosa, and tongue normal  NECK: supple, thyroid normal size, non-tender, without nodularity LYMPH:  no palpable lymphadenopathy in the cervical, axillary or inguinal LUNGS: clear to auscultation and percussion with normal breathing effort HEART: regular rate & rhythm and no murmurs and no lower extremity edema ABDOMEN:abdomen soft, non-tender and normal bowel sounds (+) multiple small incision in abdomen and 3 cm incision below umbilical, all healed well.  Musculoskeletal:no cyanosis of digits and no clubbing  PSYCH: alert & oriented x 3 with  fluent speech NEURO: no focal motor/sensory deficits  LABORATORY DATA:  I have reviewed the data as listed CBC Latest Ref Rng & Units 11/05/2017 08/07/2017 07/02/2017  WBC 3.9 - 10.3 K/uL 7.6 6.3 5.6  Hemoglobin 11.6 -  15.9 g/dL 14.4 14.1 13.4  Hematocrit 34.8 - 46.6 % 44.4 42.8 39.6  Platelets 145 - 400 K/uL 391 312 287   CMP Latest Ref Rng & Units 11/05/2017 08/07/2017 07/02/2017  Glucose 70 - 140 mg/dL 133 83 87  BUN 7 - 26 mg/dL 15 12 14.1  Creatinine 0.60 - 1.10 mg/dL 0.75 0.77 0.7  Sodium 136 - 145 mmol/L 140 141 138  Potassium 3.5 - 5.1 mmol/L 4.3 3.9 3.8  Chloride 98 - 109 mmol/L 106 104 -  CO2 22 - 29 mmol/L 26 26 23   Calcium 8.4 - 10.4 mg/dL 9.5 9.4 9.3  Total Protein 6.4 - 8.3 g/dL 7.2 7.5 6.9  Total Bilirubin 0.2 - 1.2 mg/dL 0.4 0.6 0.69  Alkaline Phos 40 - 150 U/L 104 128 100  AST 5 - 34 U/L 20 35(H) 24  ALT 0 - 55 U/L 13 31 17    PATHOLOGY REPORT   Diagnosis 02/18/17 1. Spleen - BENIGN SPLEEN (152 G) - NO MALIGNANCY IDENTIFIED 2. Colon, segmental resection for tumor, rectosigmoid - RESIDUAL ADENOCARCINOMA, MODERATELY DIFFERENTIATED - MARGINS UNINVOLVED BY CARCINOMA - NO CARCINOMA IDENTIFIED IN TEN LYMPH NODES (0/10) - SEE ONCOLOGY TABLE AND COMMENT BELOW 3. Rectum, resection, distal stump - BENIGN COLONIC MUCOSA - NO CARCINOMA IDENTIFIED IN ONE LYMPH NODE (0/1) 4. Colon, resection margin (donut), final distal - BENIGN COLONIC MUCOSA - NO CARCINOMA IDENTIFIED Microscopic Comment 2. COLON AND RECTUM (INCLUDING TRANS-ANAL RESECTION): Specimen: Rectosigmoid colon Procedure: Low anterior resection Tumor site: Rectum Specimen integrity: Intact Macroscopic intactness of mesorectum: Complete Macroscopic tumor perforation: Not identified Invasive tumor: Maximum size: 0.4 cm Histologic type(s): Adenocarcinoma Histologic grade and differentiation: G2 (moderately differentiated/low grade) Microscopic extension of invasive tumor: Tumor invades muscularis  propria Lymph-Vascular invasion: Not identified Peri-neural invasion: Not identified Microscopic Comment(continued) Tumor deposit(s): Not identified Resection margins: Uninvolved by carcinoma Distance closest margin: Distal (2 cm) Treatment effect: Single cells or rare small groups of cancer cells (near complete response, score 1) Lymph nodes: number examined 11; number positive: 0 (See also Part 3) Pathologic Staging: ypT2, ypN0 (see comment) Ancillary studies: Available upon request (Block 2C) Comment: There is a small fibrotic nodule with necrosis, calcifications and foreign body giant cell reaction which may represent treatment effect within a tumor nodule or lymph node.   Diagnosis 10/14/2016 Colon, biopsy, rectosigmoid tumor - ADENOCARCINOMA.  RADIOGRAPHIC STUDIES: I have personally reviewed the radiological images as listed and agreed with the findings in the report. No results found.  CT CAP W Contrast 08/05/17 IMPRESSION: 1. Interval resection of rectal mass without evidence for metastatic disease in the chest, abdomen, or pelvis. 2. 6 mm nodular opacity in the posterior right costophrenic sulcus. 3. Stable hepatic cysts.  CT CAP W Contrast: 10/15/16 IMPRESSION: 4 cm annular colonic soft tissue mass near the rectosigmoid junction, consistent with known primary carcinoma. Several small pericolonic lymph nodes measuring up to 10 mm, suspicious for local lymph node metastases. No evidence of distant metastatic disease. Proximal sigmoid diverticulosis.  No evidence of diverticulitis.  Colonoscopy 10/14/2016 - The perianal exam findings include a perianal fungal rash. - The digital rectal exam was normal. - The terminal ileum appeared normal. - A fungating partially obstructing large mass was found in the recto-sigmoid colon. The mass was circumferential. The mass begins about 10 cm from the dentate line and measured five cm in length. Oozing was present. Multiple  biopsies were obtained with cold forceps for histology in a targeted manner. Area proximal to the tumor was tattooed  on opposite walls with an injection of 3 mL total of Spot (carbon black). - Multiple small and large-mouthed diverticula were found in the sigmoid colon and descending colon. - The retroflexed view of the distal rectum and anal verge was normal and showed no anal or rectal abnormalities.  ASSESSMENT & PLAN: 67 y.o.  Caucasian female, with past medical history of ulcerative colitis, thrombocytopenia, presented with diarrhea, constipation, and persistent hematochezia.  1. Rectosigmoid adenocarcinoma, cT3N1M0, stage IIIB, ypT2N0M0, MMR normal  -I previously reviewed his CT scan findings, colonoscopy and biopsy pathology results in great details with patient. -The tumor is located in the proximal rectum and rectosigmoid junction. -Prior CT scan showed several perirectal lymph nodes, suspicious for node metastasis. She likely has locally advanced disease. -I previously presented her case in our GI tumor Board. Due to the location of the cancer, Dr. Marcello Moores recommends neoadjuvant chemotherapy and radiation, followed by surgical resection. -Tumor Board feels her disease is likely T3 N1 based on CT scan findings, EUS or MRI for staging is probably not needed. 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 -She had a great response to radiation and chemotherapy, her residual tumor was T2, nodes were negative.   -We previously discussed the surgical pathology results in detail. -Given her excellent response to neoadjuvant radiation and chemotherapy, she has minimal residual disease (near complete response), nodes negative, I previously  recommend her to take adjuvant Xeloda for 4-5 months.  -She started Xeloda 03/23/17 and has  been tolerating very well, and completed 6 cycles on 06/2017.  -CT CAP from 08/05/17 reveals interval resection of rectal mass without evidence for metastatic disease in the chest, abdomen, or pelvis, a 6 mm nodular opacity in the posterior right costophrenic sulcus, and stable hepatic cysts. Previously discussed and reviewed results with pt.  -She is clinically doing well, physical exam was unremarkable. Labs WNL. Last CEA was normal.  -Next colonoscopy due in 01/2018. I will order a mammogram for her this year.  -Repeat scan in early 2020 -I encouraged she get a PCP to see regularly for preventive care. I recommend [facility]. She is interested so I provided her with the contact information.  -She is almost 2 years out since diagnosis. Next year we will see her every 6 months.  -F/u in 4 months   2. 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 This is most consistent with ITP.  --He developed severe some cytopenia after low-dose chemotherapy Xeloda which was held for her new adjuvant treatment. -She is now status post splenectomy, had a great response, her thrombopenia has resolved. -Continue monitoring, she has not had any thrombocytopenia from adjuvant Xeloda. -resolved currently    3. History of ulcerative colitis -We previously discussed that her colitis may exacerbate during the radiation and chemotherapy. -She may use Imodium as needed for diarrhea, she'll follow-up with Dr. Hilarie Fredrickson if needed. -previously advised the patient regarding pelvic floor exercises to aid with increased bowel movements.  -previously advised the patient regarding physical therapy pelvic health classes, to which the patient declined today -Continue to follow up with Dr. Hilarie Fredrickson   4. Cancer screening -She has not had a mammogram for many years, she agrees to get one at  breast  cancer, I will order for today. -I also encouraged her to find a primary care physician for wellness checkup, including Pap smear  Plan Lab and f/u in 4 months  Screening mammogram at [facility] in a few months     No orders of the defined types were placed in this encounter.   All questions were answered. The patient knows to call the clinic with any problems, questions or concerns.  I spent 15 minutes counseling the patient face to face. The total time spent in the appointment was 20 minutes and more than 50% was on counseling.  This document serves as a record of services personally performed by Truitt Merle, MD. It was created on her behalf by Joslyn Devon, a trained medical scribe. The creation of this record is based on the scribe's personal observations and the provider's statements to them.   I have reviewed the above documentation for accuracy and completeness, and I agree with the above.      Truitt Merle, MD 11/05/2017

## 2017-11-05 ENCOUNTER — Inpatient Hospital Stay: Payer: Medicare HMO | Attending: Hematology | Admitting: Hematology

## 2017-11-05 ENCOUNTER — Encounter: Payer: Self-pay | Admitting: Hematology

## 2017-11-05 ENCOUNTER — Inpatient Hospital Stay: Payer: Medicare HMO

## 2017-11-05 ENCOUNTER — Telehealth: Payer: Self-pay | Admitting: Hematology

## 2017-11-05 VITALS — BP 124/71 | HR 62 | Temp 97.8°F | Resp 18 | Ht 59.0 in | Wt 121.3 lb

## 2017-11-05 DIAGNOSIS — Z800 Family history of malignant neoplasm of digestive organs: Secondary | ICD-10-CM

## 2017-11-05 DIAGNOSIS — K5190 Ulcerative colitis, unspecified, without complications: Secondary | ICD-10-CM | POA: Diagnosis not present

## 2017-11-05 DIAGNOSIS — Z803 Family history of malignant neoplasm of breast: Secondary | ICD-10-CM

## 2017-11-05 DIAGNOSIS — D696 Thrombocytopenia, unspecified: Secondary | ICD-10-CM | POA: Diagnosis not present

## 2017-11-05 DIAGNOSIS — Z923 Personal history of irradiation: Secondary | ICD-10-CM | POA: Insufficient documentation

## 2017-11-05 DIAGNOSIS — Z1231 Encounter for screening mammogram for malignant neoplasm of breast: Secondary | ICD-10-CM

## 2017-11-05 DIAGNOSIS — C20 Malignant neoplasm of rectum: Secondary | ICD-10-CM | POA: Diagnosis not present

## 2017-11-05 DIAGNOSIS — D693 Immune thrombocytopenic purpura: Secondary | ICD-10-CM | POA: Diagnosis not present

## 2017-11-05 DIAGNOSIS — Z9221 Personal history of antineoplastic chemotherapy: Secondary | ICD-10-CM

## 2017-11-05 DIAGNOSIS — Z809 Family history of malignant neoplasm, unspecified: Secondary | ICD-10-CM | POA: Diagnosis not present

## 2017-11-05 DIAGNOSIS — K7689 Other specified diseases of liver: Secondary | ICD-10-CM

## 2017-11-05 DIAGNOSIS — Z9081 Acquired absence of spleen: Secondary | ICD-10-CM | POA: Insufficient documentation

## 2017-11-05 DIAGNOSIS — Z87891 Personal history of nicotine dependence: Secondary | ICD-10-CM

## 2017-11-05 DIAGNOSIS — K5730 Diverticulosis of large intestine without perforation or abscess without bleeding: Secondary | ICD-10-CM | POA: Insufficient documentation

## 2017-11-05 DIAGNOSIS — B356 Tinea cruris: Secondary | ICD-10-CM | POA: Diagnosis not present

## 2017-11-05 LAB — CBC WITH DIFFERENTIAL/PLATELET
Basophils Absolute: 0.1 10*3/uL (ref 0.0–0.1)
Basophils Relative: 1 %
EOS ABS: 0.1 10*3/uL (ref 0.0–0.5)
EOS PCT: 2 %
HCT: 44.4 % (ref 34.8–46.6)
Hemoglobin: 14.4 g/dL (ref 11.6–15.9)
LYMPHS ABS: 1.8 10*3/uL (ref 0.9–3.3)
Lymphocytes Relative: 24 %
MCH: 32.7 pg (ref 25.1–34.0)
MCHC: 32.4 g/dL (ref 31.5–36.0)
MCV: 100.7 fL (ref 79.5–101.0)
MONO ABS: 0.5 10*3/uL (ref 0.1–0.9)
Monocytes Relative: 7 %
Neutro Abs: 5.1 10*3/uL (ref 1.5–6.5)
Neutrophils Relative %: 66 %
Platelets: 391 10*3/uL (ref 145–400)
RBC: 4.41 MIL/uL (ref 3.70–5.45)
RDW: 13.2 % (ref 11.2–14.5)
WBC: 7.6 10*3/uL (ref 3.9–10.3)

## 2017-11-05 LAB — COMPREHENSIVE METABOLIC PANEL
ALBUMIN: 3.6 g/dL (ref 3.5–5.0)
ALT: 13 U/L (ref 0–55)
AST: 20 U/L (ref 5–34)
Alkaline Phosphatase: 104 U/L (ref 40–150)
Anion gap: 8 (ref 3–11)
BUN: 15 mg/dL (ref 7–26)
CHLORIDE: 106 mmol/L (ref 98–109)
CO2: 26 mmol/L (ref 22–29)
Calcium: 9.5 mg/dL (ref 8.4–10.4)
Creatinine, Ser: 0.75 mg/dL (ref 0.60–1.10)
GFR calc Af Amer: >60 mL/min (ref >60)
GLUCOSE: 133 mg/dL (ref 70–140)
Potassium: 4.3 mmol/L (ref 3.5–5.1)
Sodium: 140 mmol/L (ref 136–145)
Total Bilirubin: 0.4 mg/dL (ref 0.2–1.2)
Total Protein: 7.2 g/dL (ref 6.4–8.3)

## 2017-11-05 LAB — CEA (IN HOUSE-CHCC): CEA (CHCC-IN HOUSE): 3.38 ng/mL (ref 0.00–5.00)

## 2017-11-05 NOTE — Telephone Encounter (Signed)
Appt scheduled calendar / letter mailed to patient per 4/11 los

## 2017-11-23 ENCOUNTER — Encounter: Payer: Self-pay | Admitting: Internal Medicine

## 2017-12-15 ENCOUNTER — Ambulatory Visit
Admission: RE | Admit: 2017-12-15 | Discharge: 2017-12-15 | Disposition: A | Discharge status: Home / Self Care | Payer: Medicare HMO | Source: Ambulatory Visit | Attending: Hematology | Admitting: Hematology

## 2017-12-15 DIAGNOSIS — Z1231 Encounter for screening mammogram for malignant neoplasm of breast: Secondary | ICD-10-CM

## 2018-01-11 ENCOUNTER — Ambulatory Visit (INDEPENDENT_AMBULATORY_CARE_PROVIDER_SITE_OTHER): Payer: Medicare HMO | Admitting: Family Medicine

## 2018-01-11 ENCOUNTER — Encounter: Payer: Self-pay | Admitting: Family Medicine

## 2018-01-11 VITALS — BP 110/76 | HR 70 | Temp 98.0°F | Ht <= 58 in | Wt 114.0 lb

## 2018-01-11 DIAGNOSIS — R42 Dizziness and giddiness: Secondary | ICD-10-CM

## 2018-01-11 DIAGNOSIS — Z7689 Persons encountering health services in other specified circumstances: Secondary | ICD-10-CM

## 2018-01-11 NOTE — Patient Instructions (Signed)

## 2018-01-11 NOTE — Progress Notes (Signed)
Patient presents to clinic today to establish care.  SUBJECTIVE: PMH:Pt is a 67 yo with pmh sig for rectal cancer, chronic ITP.  Patient had a PCP in [facility].  Patient has hematologist oncologist and radiation oncologist here in [place].  Dizziness: -Patient endorses recent episode of dizziness after exercising. -Patient notes she was laying on her back and lifting weights over her head when the feeling started -It only lasted a few minutes.  Patient noted feeling of dizziness one additional time in a 2-week span. -Since dizziness pt increased her intake of water.  May drink 1 quart per day -Patient recently got a new prescription for  progressive lenses in her glasses.  Rectosigmoid cancer: -Noted on colonoscopy 10/14/2016 by Dr. Hilarie Fredrickson, gastroenterology -Biopsy with adenocarcinoma -Chemo with Xeloda and radiation.  Radiation oncologist Dr. Kyung Rudd, hematologist Dr. Truitt Merle -underwent surgery in July 2018, Gen surg, Dr. Annie Main gross and Dr. Leighton Ruff  Allergies: NKDA  Past surgical history: Rectal cancer 2018  Social history: Patient is widowed.  She has no children.  She completed postgraduate work.  Pt is currently retired.  Patient denies tobacco and drug use.  Patient endorses nightly alcohol use, has 1 drink.  Health Maintenance: Dental --Dr. Randol [facility] -- recently seen Immunizations --tetanus vaccine, 2011  Colonoscopy --2018 Mammogram --May 2019 PAP --2013    Past Medical History:  Diagnosis Date  . Cancer ([place]) 10/14/2016   rectal cancer-adenocarcinoma  . Clotting disorder ([place])   . Temporary low platelet count ([facility])     Past Surgical History:  Procedure Laterality Date  . COLONOSCOPY     with propofol   . INTRAOPERATIVE CHOLANGIOGRAM  02/18/2017   Procedure: INTRAOPERATIVE ASSESSMENT OF VASCULAR PERFUSION;  Surgeon: Leighton Ruff, MD;  Location: [facility];  Service: General;;  . LAPAROSCOPIC SPLENECTOMY  02/18/2017   Procedure: XI ROBOTIC ASSISTED SPLENECTOMY;  Surgeon: Michael Boston, MD;  Location: [facility];  Service: General;;  . NO PAST SURGERIES    . XI ROBOTIC ASSISTED LOWER ANTERIOR RESECTION N/A 02/18/2017   Procedure: XI ROBOTIC ASSISTED LOWER ANTERIOR RESECTION WITH SPLENIC FLEXURE MOBILIZATION;  Surgeon: Leighton Ruff, MD;  Location: [facility];  Service: General;  Laterality: N/A;    Current Outpatient Medications on File Prior to Visit  Medication Sig Dispense Refill  . cholecalciferol (VITAMIN D) 1000 units tablet Take 1 tablet by mouth daily.    Marland Kitchen ibuprofen (ADVIL,MOTRIN) 200 MG tablet Take 1-2 tablets (200-400 mg total) by mouth every 6 (six) hours as needed (for pain).    . Multiple Vitamins-Minerals (OCUVITE PO) Take 1 tablet by mouth daily.     No current facility-administered medications on file prior to visit.     No Known Allergies  Family History  Problem Relation Age of Onset  . Alzheimer's disease Mother   . Liver disease Father 98       failure  . Breast cancer Sister   . Cancer Sister 56       breast cancer   . Heart disease Maternal Grandfather   . Cancer Paternal Grandmother        type unknown    Social History   Socioeconomic History  . Marital status: Widowed    Spouse name: Not on file  . Number of children: 0  . Years of education: Not on file  . Highest education level: Not on file  Occupational History  . Occupation: retired  Scientific laboratory technician  . Financial resource strain: Not on file  .  Food insecurity:    Worry: Not on file    Inability: Not on file  . Transportation needs:    Medical: Not on file    Non-medical: Not on file  Tobacco Use  . Smoking status: Former Smoker    Packs/day: 0.50    Years: 30.00    Pack years: 15.00    Last attempt to quit: 07/28/1997    Years since quitting: 20.4  . Smokeless tobacco: Former Network engineer and Sexual Activity  . Alcohol use: Yes    Alcohol/week: 4.2 oz    Types: 7 Glasses of wine per week    Comment:  wine and beer  . Drug use: No  . Sexual activity: Not on file  Lifestyle  . Physical activity:    Days per week: Not on file    Minutes per session: Not on file  . Stress: Not on file  Relationships  . Social connections:    Talks on phone: Not on file    Gets together: Not on file    Attends religious service: Not on file    Active member of club or organization: Not on file    Attends meetings of clubs or organizations: Not on file    Relationship status: Not on file  . Intimate partner violence:    Fear of current or ex partner: Not on file    Emotionally abused: Not on file    Physically abused: Not on file    Forced sexual activity: Not on file  Other Topics Concern  . Not on file  Social History Narrative  . Not on file    ROS General: Denies fever, chills, night sweats, changes in weight, changes in appetite  +dizziness. HEENT: Denies headaches, ear pain, changes in vision, rhinorrhea, sore throat CV: Denies CP, palpitations, SOB, orthopnea Pulm: Denies SOB, cough, wheezing GI: Denies abdominal pain, nausea, vomiting, diarrhea, constipation GU: Denies dysuria, hematuria, frequency, vaginal discharge Msk: Denies muscle cramps, joint pains Neuro: Denies weakness, numbness, tingling Skin: Denies rashes, bruising Psych: Denies depression, anxiety, hallucinations  BP 110/76 (BP Location: Left Arm, Patient Position: Sitting, Cuff Size: Normal)   Pulse 70   Temp 98 F (36.7 C) (Oral)   Ht 4' 9"  (1.448 m)   Wt 114 lb (51.7 kg)   SpO2 98%   BMI 24.67 kg/m   Physical Exam Gen. Pleasant, well developed, well-nourished, in NAD HEENT - [place]/AT, PERRL, EOMI, no nystagmus, conjunctive clear, no scleral icterus, no nasal drainage, pharynx without erythema or exudate.  TMs normal bilaterally.  No cervical lymphadenopathy. Lungs: no use of accessory muscles, CTAB, no wheezes, rales or rhonchi Cardiovascular: RRR, No r/g/m, no peripheral edema Abdomen: BS present, soft,  nontender, nondistended Neuro:  A&Ox3, CN II-XII intact, normal gait Skin:  Warm, dry, intact, no lesions  Recent Results (from the past 2160 hour(s))  CEA (IN HOUSE-[facility])     Status: None   Collection Time: 11/05/17 10:49 AM  Result Value Ref Range   CEA ([facility]-In House) 3.38 0.00 - 5.00 ng/mL    Comment: Performed at [facility], [address], [place], [place] [address]  Comprehensive metabolic panel     Status: None   Collection Time: 11/05/17 10:49 AM  Result Value Ref Range   Sodium 140 136 - 145 mmol/L   Potassium 4.3 3.5 - 5.1 mmol/L   Chloride 106 98 - 109 mmol/L   CO2 26 22 - 29 mmol/L   Glucose, Bld 133  70 - 140 mg/dL   BUN 15 7 - 26 mg/dL   Creatinine, Ser 0.75 0.60 - 1.10 mg/dL   Calcium 9.5 8.4 - 10.4 mg/dL   Total Protein 7.2 6.4 - 8.3 g/dL   Albumin 3.6 3.5 - 5.0 g/dL   AST 20 5 - 34 U/L   ALT 13 0 - 55 U/L   Alkaline Phosphatase 104 40 - 150 U/L   Total Bilirubin 0.4 0.2 - 1.2 mg/dL   GFR calc non Af Amer >60 >60 mL/min   GFR calc Af Amer >60 >60 mL/min    Comment: (NOTE) The eGFR has been calculated using the CKD EPI equation. This calculation has not been validated in all clinical situations. eGFR's persistently <60 mL/min signify possible Chronic Kidney Disease.    Anion gap 8 3 - 11    Comment: Performed at [facility], [address] [place], [place] [address]  CBC with Differential     Status: None   Collection Time: 11/05/17 10:49 AM  Result Value Ref Range   WBC 7.6 3.9 - 10.3 K/uL   RBC 4.41 3.70 - 5.45 MIL/uL   Hemoglobin 14.4 11.6 - 15.9 g/dL   HCT 44.4 34.8 - 46.6 %   MCV 100.7 79.5 - 101.0 fL   MCH 32.7 25.1 - 34.0 pg   MCHC 32.4 31.5 - 36.0 g/dL   RDW 13.2 11.2 - 14.5 %   Platelets 391 145 - 400 K/uL   Neutrophils Relative % 66 %   Neutro Abs 5.1 1.5 - 6.5 K/uL   Lymphocytes Relative 24 %   Lymphs Abs 1.8 0.9 - 3.3 K/uL   Monocytes Relative 7 %   Monocytes Absolute 0.5 0.1 - 0.9  K/uL   Eosinophils Relative 2 %   Eosinophils Absolute 0.1 0.0 - 0.5 K/uL   Basophils Relative 1 %   Basophils Absolute 0.1 0.0 - 0.1 K/uL    Comment: Performed at [facility], [place] [address], [place], [place] [address]    Assessment/Plan: Vertigo -Discussed various causes. -Discussed taking time when going from a sitting to standing position underlying. -Patient encouraged to increase p.o. intake of water -Given handout -Discussed medication if needed.  Patient declines at this time -If symptoms return will consider Antivert.  Encounter to establish care -We reviewed the PMH, PSH, FH, SH, Meds and Allergies. -We provided refills for any medications we will prescribe as needed. -We addressed current concerns per orders and patient instructions. -We have asked for records for pertinent exams, studies, vaccines and notes from previous providers. -We have advised patient to follow up per instructions below.  F/u prn  Grier Mitts, MD

## 2018-01-19 ENCOUNTER — Other Ambulatory Visit: Payer: Self-pay

## 2018-01-19 ENCOUNTER — Ambulatory Visit (AMBULATORY_SURGERY_CENTER): Payer: Self-pay

## 2018-01-19 VITALS — Ht 59.0 in | Wt 115.4 lb

## 2018-01-19 DIAGNOSIS — Z85038 Personal history of other malignant neoplasm of large intestine: Secondary | ICD-10-CM

## 2018-01-19 DIAGNOSIS — C20 Malignant neoplasm of rectum: Secondary | ICD-10-CM

## 2018-01-19 MED ORDER — PEG 3350-KCL-NA BICARB-NACL 420 G PO SOLR: 4000.0000 mL | Freq: Once | ORAL | 0 refills | Status: AC

## 2018-01-19 NOTE — Progress Notes (Signed)
No egg or soy allergy known to patient  No issues with past sedation with any surgeries  or procedures, no intubation problems  No diet pills per patient No home 02 use per patient  No blood thinners per patient  Pt denies issues with constipation  No A fib or A flutter  EMMI video sent to pt's e mail , declined

## 2018-02-02 ENCOUNTER — Encounter: Payer: Medicare HMO | Admitting: Internal Medicine

## 2018-02-09 ENCOUNTER — Encounter: Payer: Self-pay | Admitting: Internal Medicine

## 2018-02-10 ENCOUNTER — Telehealth: Payer: Self-pay | Admitting: Hematology

## 2018-02-10 NOTE — Telephone Encounter (Signed)
Patient called to reschedule

## 2018-02-23 ENCOUNTER — Encounter: Payer: Self-pay | Admitting: Internal Medicine

## 2018-02-23 ENCOUNTER — Ambulatory Visit (AMBULATORY_SURGERY_CENTER): Payer: Medicare HMO | Admitting: Internal Medicine

## 2018-02-23 VITALS — BP 100/67 | HR 64 | Temp 99.8°F | Resp 11 | Ht 59.0 in | Wt 115.0 lb

## 2018-02-23 DIAGNOSIS — Z1211 Encounter for screening for malignant neoplasm of colon: Secondary | ICD-10-CM | POA: Diagnosis not present

## 2018-02-23 DIAGNOSIS — Z85038 Personal history of other malignant neoplasm of large intestine: Secondary | ICD-10-CM

## 2018-02-23 DIAGNOSIS — D124 Benign neoplasm of descending colon: Secondary | ICD-10-CM

## 2018-02-23 HISTORY — PX: COLONOSCOPY: SHX174

## 2018-02-23 MED ORDER — SODIUM CHLORIDE 0.9 % IV SOLN: 500.0000 mL | Freq: Once | INTRAVENOUS | Status: DC

## 2018-02-23 NOTE — Patient Instructions (Addendum)
INFORMATION ON POLYPS AND DIVERTICULOSIS GIVEN TO YOU TODAY   AWAIT PATHOLOGY RESULTS IN A LETTER FROM DR PYRTLE    YOU HAD AN ENDOSCOPIC PROCEDURE TODAY AT [facility]:   Refer to the procedure report that was given to you for any specific questions about what was found during the examination.  If the procedure report does not answer your questions, please call your gastroenterologist to clarify.  If you requested that your care partner not be given the details of your procedure findings, then the procedure report has been included in a sealed envelope for you to review at your convenience later.  YOU SHOULD EXPECT: Some feelings of bloating in the abdomen. Passage of more gas than usual.  Walking can help get rid of the air that was put into your GI tract during the procedure and reduce the bloating. If you had a lower endoscopy (such as a colonoscopy or flexible sigmoidoscopy) you may notice spotting of blood in your stool or on the toilet paper. If you underwent a bowel prep for your procedure, you may not have a normal bowel movement for a few days.  Please Note:  You might notice some irritation and congestion in your nose or some drainage.  This is from the oxygen used during your procedure.  There is no need for concern and it should clear up in a day or so.  SYMPTOMS TO REPORT IMMEDIATELY:   Following lower endoscopy (colonoscopy or flexible sigmoidoscopy):  Excessive amounts of blood in the stool  Significant tenderness or worsening of abdominal pains  Swelling of the abdomen that is new, acute  Fever of 100F or higher    For urgent or emergent issues, a gastroenterologist can be reached at any hour by calling [phone].   DIET:  We do recommend a small meal at first, but then you may proceed to your regular diet.  Drink plenty of fluids but you should avoid alcoholic beverages for 24 hours.  ACTIVITY:  You should plan to take it easy for the rest of  today and you should NOT DRIVE or use heavy machinery until tomorrow (because of the sedation medicines used during the test).    FOLLOW UP: Our staff will call the number listed on your records the next business day following your procedure to check on you and address any questions or concerns that you may have regarding the information given to you following your procedure. If we do not reach you, we will leave a message.  However, if you are feeling well and you are not experiencing any problems, there is no need to return our call.  We will assume that you have returned to your regular daily activities without incident.  If any biopsies were taken you will be contacted by phone or by letter within the next 1-3 weeks.  Please call us at [phone] if you have not heard about the biopsies in 3 weeks.    SIGNATURES/CONFIDENTIALITY: You and/or your care partner have signed paperwork which will be entered into your electronic medical record.  These signatures attest to the fact that that the information above on your After Visit Summary has been reviewed and is understood.  Full responsibility of the confidentiality of this discharge information lies with you and/or your care-partner.

## 2018-02-23 NOTE — Op Note (Signed)
[facility] Patient Name: Allison Dunlap Procedure Date: 02/23/2018 3:57 PM MRN: [id number] Endoscopist: Jerene Bears , MD Age: 67 Referring MD:  Date of Birth: 05-16-1951 Gender: Female Account #: [id number] Procedure:                Colonoscopy Indications:              High risk colon cancer surveillance: Personal                            history of colon cancer, last colonoscopy March                            2018, s/p LAR Medicines:                Monitored Anesthesia Care Procedure:                Pre-Anesthesia Assessment:                           - Prior to the procedure, a History and Physical                            was performed, and patient medications and                            allergies were reviewed. The patient's tolerance of                            previous anesthesia was also reviewed. The risks                            and benefits of the procedure and the sedation                            options and risks were discussed with the patient.                            All questions were answered, and informed consent                            was obtained. Prior Anticoagulants: The patient has                            taken no previous anticoagulant or antiplatelet                            agents. ASA Grade Assessment: III - A patient with                            severe systemic disease. After reviewing the risks                            and benefits, the patient was deemed in  satisfactory condition to undergo the procedure.                           After obtaining informed consent, the colonoscope                            was passed under direct vision. Throughout the                            procedure, the patient's blood pressure, pulse, and                            oxygen saturations were monitored continuously. The                            Colonoscope was introduced through the anus and                           advanced to the cecum, identified by appendiceal                            orifice and ileocecal valve. The colonoscopy was                            performed with ease. The patient tolerated the                            procedure well. The quality of the bowel                            preparation was good. The ileocecal valve,                            appendiceal orifice, and rectum were photographed. Scope In: 4:10:34 PM Scope Out: 4:23:15 PM Scope Withdrawal Time: 0 hours 9 minutes 31 seconds  Total Procedure Duration: 0 hours 12 minutes 41 seconds  Findings:                 The digital rectal exam was normal.                           There was evidence of a prior end-to-end                            low-anterior anastomosis in the recto-sigmoid                            colon. This was patent and was characterized by                            healthy appearing mucosa. The anastomosis was                            traversed. No evidence of tumor recurrence.  Scattered small-mouthed diverticula were found in                            the sigmoid colon and descending colon.                           A 3 mm polyp was found in the descending colon. The                            polyp was sessile. The polyp was removed with a                            cold snare. Resection and retrieval were complete.                           Scattered telangiectasias in the distal sigmoid                            colon as result of prior radiation treatment.                            Non-bleeding.                           Retroflexion in the rectum was not performed due to                            post-surgical anatomy. Complications:            No immediate complications. Estimated Blood Loss:     Estimated blood loss was minimal. Impression:               - Patent end-to-end low-anterior anastomosis,                             characterized by healthy appearing mucosa.                           - Diverticulosis in the sigmoid colon and in the                            descending colon.                           - One 3 mm polyp in the descending colon, removed                            with a cold snare. Resected and retrieved.                           - Scattered telangectasia in the sigmoid colon as                            result of prior radiation therapy. Recommendation:           - Patient has a contact  number available for                            emergencies. The signs and symptoms of potential                            delayed complications were discussed with the                            patient. Return to normal activities tomorrow.                            Written discharge instructions were provided to the                            patient.                           - Resume previous diet.                           - Continue present medications.                           - Await pathology results.                           - Repeat colonoscopy in 3 years for surveillance. Jerene Bears, MD 02/23/2018 4:35:06 PM This report has been signed electronically.

## 2018-02-23 NOTE — Progress Notes (Signed)
Called to room to assist during endoscopic procedure.  Patient ID and intended procedure confirmed with present staff. Received instructions for my participation in the procedure from the performing physician.  

## 2018-02-23 NOTE — Progress Notes (Signed)
To PACU, VSS. Report to [email or web address] 

## 2018-02-24 ENCOUNTER — Telehealth: Payer: Self-pay

## 2018-02-24 NOTE — Telephone Encounter (Signed)
  Follow up Call-  Call back number 10/14/2016  Post procedure Call Back phone  # [phone]  Permission to leave phone message Yes     Patient questions:  Do you have a fever, pain , or abdominal swelling? No. Pain Score  0 *  Have you tolerated food without any problems? Yes.    Have you been able to return to your normal activities? Yes.    Do you have any questions about your discharge instructions: Diet   No. Medications  No. Follow up visit  No.  Do you have questions or concerns about your Care? No.  Actions: * If pain score is 4 or above: No action needed, pain <4.

## 2018-03-08 ENCOUNTER — Other Ambulatory Visit: Payer: Medicare HMO

## 2018-03-08 ENCOUNTER — Ambulatory Visit: Payer: Medicare HMO | Admitting: Hematology

## 2018-03-08 ENCOUNTER — Encounter: Payer: Self-pay | Admitting: Internal Medicine

## 2018-03-16 NOTE — Progress Notes (Signed)
[place]  [phone] [phone]  Clinic Follow Up Note   Patient Care Team: Billie Ruddy, MD as PCP - General (Family Medicine) Pyrtle, Lajuan Lines, MD as Consulting Physician (Gastroenterology) Leighton Ruff, MD as Consulting Physician (General Surgery) Kyung Rudd, MD as Consulting Physician (Radiation Oncology) Truitt Merle, MD as Consulting Physician (Hematology) Michael Boston, MD as Consulting Physician (General Surgery)   Date of Service:  03/18/2018   CHIEF COMPLAINTS:  Follow up rectosigmoid cancer   Oncology History   Cancer Staging Rectal cancer [facility]) Staging form: Colon and Rectum, AJCC 8th Edition - Clinical stage from 10/14/2016: Stage IIIB (cT3, cN1, cM0) - Signed by Truitt Merle, MD on 10/24/2016       Rectal cancer ([facility])   10/14/2016 Initial Diagnosis    Rectal cancer ([place])    10/14/2016 Procedure    Colonoscopy by Dr. Hilarie Fredrickson showed a fungating partially upchucking large mass in the rectosigmoid colon, the mass begins about 10 cm from the dentate line and measured 5 cm in length. Biopsy was obtained. Multiple small and largemouth diverticula were found in the sigmoid colon and the ascending colon.    10/14/2016 Initial Biopsy    Rectosigmoid mass biopsy showed adenocarcinoma     10/15/2016 Imaging    CT of chest, abdomen and pelvis with contrast showed a 4 cm annular colonic soft tissue mass near the rectosigmoid junction, consistent with known primary carcinoma. Several small her chronic lymph nodes measuring up to 61m, suspicious for local lymph node metastasis. No evidence of distant metastasis.    11/17/2016 - 12/25/2016 Radiation Therapy     Radiation Therapy by Dr. MLisbeth Renshaw   11/17/2016 - 12/08/2016 Chemotherapy    Concurrent chemo Xeloda (15048min am and 100033mn evening) to go along with radiation.   Hold chemo due to platelet count 12/08/16    02/18/2017 Surgery    XI ROBOTIC ASSISTED LOWER ANTERIOR RESECTION WITH  SPLENIC FLEXURE MOBILIZATION and INTRAOPERATIVE ASSESSMENT OF VASCULAR PERFUSION by Dr. ThoMarcello Moores 02/18/2017 Pathology Results    Diagnosis 02/18/17 1. Spleen - BENIGN SPLEEN (152 G) - NO MALIGNANCY IDENTIFIED 2. Colon, segmental resection for tumor, rectosigmoid - RESIDUAL ADENOCARCINOMA, MODERATELY DIFFERENTIATED - MARGINS UNINVOLVED BY CARCINOMA - NO CARCINOMA IDENTIFIED IN TEN LYMPH NODES (0/10) - SEE ONCOLOGY TABLE AND COMMENT BELOW 3. Rectum, resection, distal stump - BENIGN COLONIC MUCOSA - NO CARCINOMA IDENTIFIED IN ONE LYMPH NODE (0/1) 4. Colon, resection margin (donut), final distal - BENIGN COLONIC MUCOSA - NO CARCINOMA IDENTIFIED     03/23/2017 - 07/20/2017 Chemotherapy    Xeloda 1500m29m2h, 2 weeks on and 1 weeks off, started on 03/23/17 and comeplted 6 cycles on 07/20/17     08/05/2017 Imaging    CT CAP W Contrast  IMPRESSION: 1. Interval resection of rectal mass without evidence for metastatic disease in the chest, abdomen, or pelvis. 2. 6 mm nodular opacity in the posterior right costophrenic sulcus. 3. Stable hepatic cysts.     HISTORY OF PRESENTING ILLNESS:  Allison Utseyy77. female is here because of Her recently diagnosed rectosigmoid colon cancer. She was referred by her gastroenterologist Dr. PyrtHilarie Fredricksone presents to my clinic by herself today.  She has been having constipation and diarrhea for 6 months, and daily hematochezia, a tea spoon each time, no rectal pain, no bloating, nausea or other symptoms, no change of appetite and weight. She does not have a primary care physician, and self-referred to the [facility]  gastroenterology. She was seen by Dr. Hilarie Fredrickson. She underwent colonoscopy, which showed a large mass in the rectosigmoid junction. Biopsy showed adenocarcinoma.  She was diagnosed with ulcerative colitis in her 20's, resolved and now.  She has had thrombocytopenia since 2013, she has bone marrow biopsy, which was normal per pt, never  required any treatment    She is widowed, no children, has one brother in [place], she is not working   CURRENT THERAPY: Surveillance   INTERVAL HISTORY:   Gyneth Hubka returns today for follow up. She is here alone. She is feeling well and doesn't have any new symptoms. She says that her diarrhea is related to her diet, she is trying to find the right food for her.     MEDICAL HISTORY:  Past Medical History:  Diagnosis Date  . Arthritis   . Blood in stool 2018  . Cancer ([place]) 10/14/2016   rectal cancer-adenocarcinoma  . Clotting disorder ([place])   . Temporary low platelet count ([place])     SURGICAL HISTORY: Past Surgical History:  Procedure Laterality Date  . COLON SURGERY    . COLONOSCOPY     with propofol   . INTRAOPERATIVE CHOLANGIOGRAM  02/18/2017   Procedure: INTRAOPERATIVE ASSESSMENT OF VASCULAR PERFUSION;  Surgeon: Leighton Ruff, MD;  Location: [facility];  Service: General;;  . LAPAROSCOPIC SPLENECTOMY  02/18/2017   Procedure: XI ROBOTIC ASSISTED SPLENECTOMY;  Surgeon: Michael Boston, MD;  Location: [facility];  Service: General;;  . NO PAST SURGERIES    . XI ROBOTIC ASSISTED LOWER ANTERIOR RESECTION N/A 02/18/2017   Procedure: XI ROBOTIC ASSISTED LOWER ANTERIOR RESECTION WITH SPLENIC FLEXURE MOBILIZATION;  Surgeon: Leighton Ruff, MD;  Location: [facility];  Service: General;  Laterality: N/A;    SOCIAL HISTORY: Social History   Socioeconomic History  . Marital status: Widowed    Spouse name: Not on file  . Number of children: 0  . Years of education: Not on file  . Highest education level: Not on file  Occupational History  . Occupation: retired  Scientific laboratory technician  . Financial resource strain: Not on file  . Food insecurity:    Worry: Not on file    Inability: Not on file  . Transportation needs:    Medical: Not on file    Non-medical: Not on file  Tobacco Use  . Smoking status: Former Smoker    Packs/day: 0.50    Years: 30.00    Pack years: 15.00    Last  attempt to quit: 07/28/1997    Years since quitting: 20.6  . Smokeless tobacco: Former Network engineer and Sexual Activity  . Alcohol use: Yes    Alcohol/week: 7.0 standard drinks    Types: 7 Cans of beer per week    Comment:  beer, cider  . Drug use: No  . Sexual activity: Not on file  Lifestyle  . Physical activity:    Days per week: Not on file    Minutes per session: Not on file  . Stress: Not on file  Relationships  . Social connections:    Talks on phone: Not on file    Gets together: Not on file    Attends religious service: Not on file    Active member of club or organization: Not on file    Attends meetings of clubs or organizations: Not on file    Relationship status: Not on file  . Intimate partner violence:    Fear of current or ex partner: Not on  file    Emotionally abused: Not on file    Physically abused: Not on file    Forced sexual activity: Not on file  Other Topics Concern  . Not on file  Social History Narrative  . Not on file    FAMILY HISTORY: Family History  Problem Relation Age of Onset  . Alzheimer's disease Mother   . Liver disease Father 101       failure  . Breast cancer Sister   . Cancer Sister 22       breast cancer   . Colon polyps Sister   . Heart disease Maternal Grandfather   . Cancer Paternal Grandmother        type unknown  . Rectal cancer Paternal Grandmother   . Colon polyps Brother   . Esophageal cancer Neg Hx   . Colon cancer Neg Hx   . Stomach cancer Neg Hx     ALLERGIES:  has No Known Allergies.  MEDICATIONS:  Current Outpatient Medications  Medication Sig Dispense Refill  . cholecalciferol (VITAMIN D) 1000 units tablet Take 1 tablet by mouth daily.    Marland Kitchen ibuprofen (ADVIL,MOTRIN) 200 MG tablet Take 1-2 tablets (200-400 mg total) by mouth every 6 (six) hours as needed (for pain).    . Multiple Vitamins-Minerals (CENTRUM SILVER 50+WOMEN) TABS      No current facility-administered medications for this visit.      REVIEW OF SYSTEMS:  Constitutional: Denies fevers, chills or abnormal night sweats Eyes: Denies blurriness of vision, double vision or watery eyes Ears, nose, mouth, throat, and face: Denies mucositis or sore throat Respiratory: Denies cough, dyspnea or wheezes Cardiovascular: Denies palpitation, chest discomfort or lower extremity swelling Gastrointestinal:  Denies heartburn, No nausea (+) diet related diarrhea Skin: No skin rash Lymphatics: Denies new lymphadenopathy or easy bruising Neurological:Denies numbness, tingling or new weaknesses Behavioral/Psych: Mood is stable, no new changes  All other systems were reviewed with the patient and are negative.  PHYSICAL EXAMINATION:  ECOG PERFORMANCE STATUS: 0 Vitals:   03/18/18 1131  BP: 127/81  Pulse: 60  Resp: 17  Temp: 98 F (36.7 C)  TempSrc: Oral  SpO2: 100%  Weight: 115 lb 4.8 oz (52.3 kg)  Height: 4' 11"  (1.499 m)    GENERAL:alert, no distress and comfortable SKIN: skin color, texture, turgor are normal,  or significant lesions  EYES: normal, conjunctiva are pink and non-injected, sclera clear OROPHARYNX:no exudate, no erythema and lips, buccal mucosa, and tongue normal  NECK: supple, thyroid normal size, non-tender, without nodularity LYMPH:  no palpable lymphadenopathy in the cervical, axillary or inguinal LUNGS: clear to auscultation and percussion with normal breathing effort HEART: regular rate & rhythm and no murmurs and no lower extremity edema ABDOMEN:abdomen soft, non-tender and normal bowel sounds (+) multiple small incision in abdomen and 3 cm incision below umbilical, all healed well.  Musculoskeletal:no cyanosis of digits and no clubbing  PSYCH: alert & oriented x 3 with fluent speech NEURO: no focal motor/sensory deficits  LABORATORY DATA:  I have reviewed the data as listed CBC Latest Ref Rng & Units 03/18/2018 11/05/2017 08/07/2017  WBC 3.9 - 10.3 K/uL 7.5 7.6 6.3  Hemoglobin 11.6 - 15.9 g/dL 13.2  14.4 14.1  Hematocrit 34.8 - 46.6 % 40.0 44.4 42.8  Platelets 145 - 400 K/uL 383 391 312   CMP Latest Ref Rng & Units 11/05/2017 08/07/2017 07/02/2017  Glucose 70 - 140 mg/dL 133 83 87  BUN 7 - 26 mg/dL 15 12 14.1  Creatinine 0.60 - 1.10 mg/dL 0.75 0.77 0.7  Sodium 136 - 145 mmol/L 140 141 138  Potassium 3.5 - 5.1 mmol/L 4.3 3.9 3.8  Chloride 98 - 109 mmol/L 106 104 -  CO2 22 - 29 mmol/L 26 26 23   Calcium 8.4 - 10.4 mg/dL 9.5 9.4 9.3  Total Protein 6.4 - 8.3 g/dL 7.2 7.5 6.9  Total Bilirubin 0.2 - 1.2 mg/dL 0.4 0.6 0.69  Alkaline Phos 40 - 150 U/L 104 128 100  AST 5 - 34 U/L 20 35(H) 24  ALT 0 - 55 U/L 13 31 17    PATHOLOGY REPORT   02/23/2018 Surgical Pathology Diagnosis Surgical [P], descending, polyp - TUBULAR ADENOMA. - NO HIGH GRADE DYSPLASIA OR MALIGNANCY.  Diagnosis 02/18/17 1. Spleen - BENIGN SPLEEN (152 G) - NO MALIGNANCY IDENTIFIED 2. Colon, segmental resection for tumor, rectosigmoid - RESIDUAL ADENOCARCINOMA, MODERATELY DIFFERENTIATED - MARGINS UNINVOLVED BY CARCINOMA - NO CARCINOMA IDENTIFIED IN TEN LYMPH NODES (0/10) - SEE ONCOLOGY TABLE AND COMMENT BELOW 3. Rectum, resection, distal stump - BENIGN COLONIC MUCOSA - NO CARCINOMA IDENTIFIED IN ONE LYMPH NODE (0/1) 4. Colon, resection margin (donut), final distal - BENIGN COLONIC MUCOSA - NO CARCINOMA IDENTIFIED Microscopic Comment 2. COLON AND RECTUM (INCLUDING TRANS-ANAL RESECTION): Specimen: Rectosigmoid colon Procedure: Low anterior resection Tumor site: Rectum Specimen integrity: Intact Macroscopic intactness of mesorectum: Complete Macroscopic tumor perforation: Not identified Invasive tumor: Maximum size: 0.4 cm Histologic type(s): Adenocarcinoma Histologic grade and differentiation: G2 (moderately differentiated/low grade) Microscopic extension of invasive tumor: Tumor invades muscularis propria Lymph-Vascular invasion: Not identified Peri-neural invasion: Not identified Microscopic  Comment(continued) Tumor deposit(s): Not identified Resection margins: Uninvolved by carcinoma Distance closest margin: Distal (2 cm) Treatment effect: Single cells or rare small groups of cancer cells (near complete response, score 1) Lymph nodes: number examined 11; number positive: 0 (See also Part 3) Pathologic Staging: ypT2, ypN0 (see comment) Ancillary studies: Available upon request (Block 2C) Comment: There is a small fibrotic nodule with necrosis, calcifications and foreign body giant cell reaction which may represent treatment effect within a tumor nodule or lymph node.   Diagnosis 10/14/2016 Colon, biopsy, rectosigmoid tumor - ADENOCARCINOMA.  RADIOGRAPHIC STUDIES: I have personally reviewed the radiological images as listed and agreed with the findings in the report.  12/16/2017 Screening Mammogram IMPRESSION: No mammographic evidence of malignancy.   CT CAP W Contrast 08/05/17 IMPRESSION: 1. Interval resection of rectal mass without evidence for metastatic disease in the chest, abdomen, or pelvis. 2. 6 mm nodular opacity in the posterior right costophrenic sulcus. 3. Stable hepatic cysts.  CT CAP W Contrast: 10/15/16 IMPRESSION: 4 cm annular colonic soft tissue mass near the rectosigmoid junction, consistent with known primary carcinoma. Several small pericolonic lymph nodes measuring up to 10 mm, suspicious for local lymph node metastases. No evidence of distant metastatic disease. Proximal sigmoid diverticulosis.  No evidence of diverticulitis.  PROCEDURES  02/23/2018 Colonoscopy IMPRESSION: - Patent end-to-end low-anterior anastomosis, characterized by healthy appearing mucosa. - Diverticulosis in the sigmoid colon and in the descending colon. - One 3 mm polyp in the descending colon, removed with a cold snare. Resected and retrieved. - Scattered telangectasia in the sigmoid colon as result of prior radiation therapy.  Colonoscopy 10/14/2016 - The perianal  exam findings include a perianal fungal rash. - The digital rectal exam was normal. - The terminal ileum appeared normal. - A fungating partially obstructing large mass was found in the recto-sigmoid colon. The mass was circumferential. The mass begins about 10 cm from the  dentate line and measured five cm in length. Oozing was present. Multiple biopsies were obtained with cold forceps for histology in a targeted manner. Area proximal to the tumor was tattooed on opposite walls with an injection of 3 mL total of Spot (carbon black). - Multiple small and large-mouthed diverticula were found in the sigmoid colon and descending colon. - The retroflexed view of the distal rectum and anal verge was normal and showed no anal or rectal abnormalities.  ASSESSMENT & PLAN:  67 y.o.  Caucasian female, with past medical history of ulcerative colitis, thrombocytopenia, presented with diarrhea, constipation, and persistent hematochezia.  1. Rectosigmoid adenocarcinoma, cT3N1M0, stage IIIB, ypT2N0M0, MMR normal  -I previously reviewed his CT scan findings, colonoscopy and biopsy pathology results in great details with patient. -The tumor is located in the proximal rectum and rectosigmoid junction. -Prior CT scan showed several perirectal lymph nodes, suspicious for node metastasis. She likely has locally advanced disease. -I previously presented her case in our GI tumor Board. Due to the location of the cancer, Dr. Marcello Moores recommends neoadjuvant chemotherapy and radiation, followed by surgical resection. -Tumor Board feels her disease is likely T3 N1 based on CT scan findings, EUS or MRI for staging is probably not needed. 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 -She had a great response to radiation and chemotherapy, her residual tumor  was T2, nodes were negative.   -We previously discussed the surgical pathology results in detail. -Given her excellent response to neoadjuvant radiation and chemotherapy, she has minimal residual disease (near complete response), nodes negative, I previously  recommend her to take adjuvant Xeloda for 4-5 months.  -She started Xeloda 03/23/17 and has been tolerating very well, and completed 6 cycles on 06/2017.  -CT CAP from 08/05/17 showed no evidence of recurrence. -She is clinically doing well, physical exam was unremarkable. Labs WNL. CMP was pending. CEA was pending  -Colonoscopy on 02/23/2018 was normal. -Repeat scan in early 2020 -I previously encouraged she get a PCP to see regularly for preventive care. I recommended [facility].  -She is almost 2 years out since diagnosis. Next year we will see her every 6 months.  -F/u in 4 months, then every 6 months after   2. 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 This is most consistent with ITP.  --He developed severe some cytopenia after low-dose chemotherapy Xeloda which was held for her new adjuvant treatment. -She is now status post splenectomy, had a great response, her thrombopenia has resolved. -Continue monitoring, she has not had any thrombocytopenia from adjuvant Xeloda. -resolved currently    3. History of ulcerative colitis -We previously discussed that her colitis may exacerbate during the radiation and chemotherapy. -She may use Imodium as needed for diarrhea, she'll follow-up with Dr. Hilarie Fredrickson if needed. -previously advised the patient regarding pelvic floor exercises to aid with increased bowel movements.  -previously advised the patient regarding physical therapy pelvic health classes,  which the patient declined  -Continue to follow up with Dr. Hilarie Fredrickson   4. Cancer screening -She has  not had a mammogram for many years, she agrees to get one at breast cancer, I will order for today. -I also encouraged her to find a primary  care physician for wellness checkup, including Pap smear  Plan Lab and f/u in 4 months       No orders of the defined types were placed in this encounter.   All questions were answered. The patient knows to call the clinic with any problems, questions or concerns.  I spent 15 minutes counseling the patient face to face. The total time spent in the appointment was 20 minutes and more than 50% was on counseling.  Dierdre Searles Dweik am acting as scribe for Dr. Truitt Merle.  I have reviewed the above documentation for accuracy and completeness, and I agree with the above.     Truitt Merle, MD 03/18/2018

## 2018-03-17 ENCOUNTER — Other Ambulatory Visit: Payer: Self-pay | Admitting: Hematology

## 2018-03-17 DIAGNOSIS — C20 Malignant neoplasm of rectum: Secondary | ICD-10-CM

## 2018-03-18 ENCOUNTER — Inpatient Hospital Stay: Payer: Medicare HMO | Admitting: Hematology

## 2018-03-18 ENCOUNTER — Telehealth: Payer: Self-pay

## 2018-03-18 ENCOUNTER — Inpatient Hospital Stay: Payer: Medicare HMO | Attending: Hematology

## 2018-03-18 VITALS — BP 127/81 | HR 60 | Temp 98.0°F | Resp 17 | Ht 59.0 in | Wt 115.3 lb

## 2018-03-18 DIAGNOSIS — Z79899 Other long term (current) drug therapy: Secondary | ICD-10-CM | POA: Diagnosis not present

## 2018-03-18 DIAGNOSIS — Z803 Family history of malignant neoplasm of breast: Secondary | ICD-10-CM | POA: Insufficient documentation

## 2018-03-18 DIAGNOSIS — Z87891 Personal history of nicotine dependence: Secondary | ICD-10-CM

## 2018-03-18 DIAGNOSIS — Z9221 Personal history of antineoplastic chemotherapy: Secondary | ICD-10-CM

## 2018-03-18 DIAGNOSIS — D696 Thrombocytopenia, unspecified: Secondary | ICD-10-CM | POA: Diagnosis not present

## 2018-03-18 DIAGNOSIS — D124 Benign neoplasm of descending colon: Secondary | ICD-10-CM | POA: Diagnosis not present

## 2018-03-18 DIAGNOSIS — Z800 Family history of malignant neoplasm of digestive organs: Secondary | ICD-10-CM

## 2018-03-18 DIAGNOSIS — K5730 Diverticulosis of large intestine without perforation or abscess without bleeding: Secondary | ICD-10-CM | POA: Diagnosis not present

## 2018-03-18 DIAGNOSIS — Z923 Personal history of irradiation: Secondary | ICD-10-CM

## 2018-03-18 DIAGNOSIS — C20 Malignant neoplasm of rectum: Secondary | ICD-10-CM | POA: Diagnosis not present

## 2018-03-18 DIAGNOSIS — K5190 Ulcerative colitis, unspecified, without complications: Secondary | ICD-10-CM

## 2018-03-18 DIAGNOSIS — Z8719 Personal history of other diseases of the digestive system: Secondary | ICD-10-CM | POA: Diagnosis not present

## 2018-03-18 DIAGNOSIS — K7689 Other specified diseases of liver: Secondary | ICD-10-CM | POA: Diagnosis not present

## 2018-03-18 DIAGNOSIS — K921 Melena: Secondary | ICD-10-CM | POA: Diagnosis not present

## 2018-03-18 DIAGNOSIS — B356 Tinea cruris: Secondary | ICD-10-CM | POA: Diagnosis not present

## 2018-03-18 DIAGNOSIS — R197 Diarrhea, unspecified: Secondary | ICD-10-CM

## 2018-03-18 DIAGNOSIS — M129 Arthropathy, unspecified: Secondary | ICD-10-CM | POA: Diagnosis not present

## 2018-03-18 DIAGNOSIS — K5900 Constipation, unspecified: Secondary | ICD-10-CM

## 2018-03-18 DIAGNOSIS — D689 Coagulation defect, unspecified: Secondary | ICD-10-CM | POA: Diagnosis not present

## 2018-03-18 DIAGNOSIS — R55 Syncope and collapse: Secondary | ICD-10-CM | POA: Diagnosis not present

## 2018-03-18 LAB — CBC WITH DIFFERENTIAL (CANCER CENTER ONLY)
BASOS ABS: 0.1 10*3/uL (ref 0.0–0.1)
BASOS PCT: 2 %
EOS ABS: 0.1 10*3/uL (ref 0.0–0.5)
Eosinophils Relative: 2 %
HEMATOCRIT: 40 % (ref 34.8–46.6)
Hemoglobin: 13.2 g/dL (ref 11.6–15.9)
Lymphocytes Relative: 27 %
Lymphs Abs: 2 10*3/uL (ref 0.9–3.3)
MCH: 31.5 pg (ref 25.1–34.0)
MCHC: 33 g/dL (ref 31.5–36.0)
MCV: 95.5 fL (ref 79.5–101.0)
MONO ABS: 0.7 10*3/uL (ref 0.1–0.9)
MONOS PCT: 9 %
NEUTROS ABS: 4.6 10*3/uL (ref 1.5–6.5)
Neutrophils Relative %: 60 %
Platelet Count: 383 10*3/uL (ref 145–400)
RBC: 4.19 MIL/uL (ref 3.70–5.45)
RDW: 13.5 % (ref 11.2–14.5)
WBC Count: 7.5 10*3/uL (ref 3.9–10.3)

## 2018-03-18 LAB — CMP (CANCER CENTER ONLY)
ALK PHOS: 103 U/L (ref 38–126)
ALT: 16 U/L (ref 0–44)
AST: 19 U/L (ref 15–41)
Albumin: 3.5 g/dL (ref 3.5–5.0)
Anion gap: 9 (ref 5–15)
BILIRUBIN TOTAL: 0.2 mg/dL — AB (ref 0.3–1.2)
BUN: 17 mg/dL (ref 8–23)
CALCIUM: 9.6 mg/dL (ref 8.9–10.3)
CO2: 26 mmol/L (ref 22–32)
CREATININE: 0.68 mg/dL (ref 0.44–1.00)
Chloride: 106 mmol/L (ref 98–111)
GFR, Est AFR Am: >60 mL/min (ref >60)
GFR, Estimated: >60 mL/min (ref >60)
GLUCOSE: 91 mg/dL (ref 70–99)
Potassium: 4.1 mmol/L (ref 3.5–5.1)
Sodium: 141 mmol/L (ref 135–145)
TOTAL PROTEIN: 7.2 g/dL (ref 6.5–8.1)

## 2018-03-18 LAB — CEA (IN HOUSE-CHCC): CEA (CHCC-In House): 3.49 ng/mL (ref 0.00–5.00)

## 2018-03-18 NOTE — Telephone Encounter (Signed)
Printed avs and calender of upcoming appointment. Per 8/22 los

## 2018-03-20 ENCOUNTER — Encounter: Payer: Self-pay | Admitting: Hematology

## 2018-03-22 ENCOUNTER — Telehealth: Payer: Self-pay

## 2018-03-22 NOTE — Telephone Encounter (Signed)
Left patient voice message that her CEA was normal last week per Dr. Burr Medico.

## 2018-03-22 NOTE — Telephone Encounter (Signed)
-----   Message from Truitt Merle, MD sent at 03/21/2018 12:01 PM EDT ----- Please let pt know her CEA was normal last week, no concerns, thanks  Truitt Merle  03/21/2018

## 2018-07-15 ENCOUNTER — Other Ambulatory Visit: Payer: Medicare HMO

## 2018-07-15 ENCOUNTER — Telehealth: Payer: Self-pay

## 2018-07-15 ENCOUNTER — Inpatient Hospital Stay: Payer: Medicare HMO | Attending: Hematology

## 2018-07-15 ENCOUNTER — Ambulatory Visit: Payer: Medicare HMO | Admitting: Hematology

## 2018-07-15 ENCOUNTER — Inpatient Hospital Stay: Payer: Medicare HMO | Admitting: Hematology

## 2018-07-15 ENCOUNTER — Encounter: Payer: Self-pay | Admitting: Hematology

## 2018-07-15 VITALS — BP 131/84 | HR 65 | Temp 98.0°F | Resp 18 | Ht 59.0 in | Wt 119.0 lb

## 2018-07-15 DIAGNOSIS — Z9081 Acquired absence of spleen: Secondary | ICD-10-CM

## 2018-07-15 DIAGNOSIS — D693 Immune thrombocytopenic purpura: Secondary | ICD-10-CM | POA: Diagnosis not present

## 2018-07-15 DIAGNOSIS — Z9221 Personal history of antineoplastic chemotherapy: Secondary | ICD-10-CM | POA: Insufficient documentation

## 2018-07-15 DIAGNOSIS — Z923 Personal history of irradiation: Secondary | ICD-10-CM | POA: Diagnosis not present

## 2018-07-15 DIAGNOSIS — Z85048 Personal history of other malignant neoplasm of rectum, rectosigmoid junction, and anus: Secondary | ICD-10-CM

## 2018-07-15 DIAGNOSIS — Z79899 Other long term (current) drug therapy: Secondary | ICD-10-CM | POA: Diagnosis not present

## 2018-07-15 DIAGNOSIS — R69 Illness, unspecified: Secondary | ICD-10-CM | POA: Diagnosis not present

## 2018-07-15 DIAGNOSIS — C20 Malignant neoplasm of rectum: Secondary | ICD-10-CM

## 2018-07-15 LAB — CMP (CANCER CENTER ONLY)
ALT: 19 U/L (ref 0–44)
ANION GAP: 11 (ref 5–15)
AST: 24 U/L (ref 15–41)
Albumin: 3.8 g/dL (ref 3.5–5.0)
Alkaline Phosphatase: 106 U/L (ref 38–126)
BILIRUBIN TOTAL: 0.4 mg/dL (ref 0.3–1.2)
BUN: 13 mg/dL (ref 8–23)
CHLORIDE: 105 mmol/L (ref 98–111)
CO2: 27 mmol/L (ref 22–32)
Calcium: 9.5 mg/dL (ref 8.9–10.3)
Creatinine: 0.74 mg/dL (ref 0.44–1.00)
Glucose, Bld: 88 mg/dL (ref 70–99)
POTASSIUM: 4 mmol/L (ref 3.5–5.1)
Sodium: 143 mmol/L (ref 135–145)
TOTAL PROTEIN: 7.7 g/dL (ref 6.5–8.1)

## 2018-07-15 LAB — CBC WITH DIFFERENTIAL (CANCER CENTER ONLY)
Abs Immature Granulocytes: 0.03 10*3/uL (ref 0.00–0.07)
Basophils Absolute: 0.1 10*3/uL (ref 0.0–0.1)
Basophils Relative: 1 %
Eosinophils Absolute: 0.1 10*3/uL (ref 0.0–0.5)
Eosinophils Relative: 1 %
HCT: 43.8 % (ref 36.0–46.0)
Hemoglobin: 14 g/dL (ref 12.0–15.0)
Immature Granulocytes: 0 %
Lymphocytes Relative: 31 %
Lymphs Abs: 2.9 10*3/uL (ref 0.7–4.0)
MCH: 31 pg (ref 26.0–34.0)
MCHC: 32 g/dL (ref 30.0–36.0)
MCV: 96.9 fL (ref 80.0–100.0)
Monocytes Absolute: 0.7 10*3/uL (ref 0.1–1.0)
Monocytes Relative: 8 %
Neutro Abs: 5.4 10*3/uL (ref 1.7–7.7)
Neutrophils Relative %: 59 %
Platelet Count: 407 10*3/uL — ABNORMAL HIGH (ref 150–400)
RBC: 4.52 MIL/uL (ref 3.87–5.11)
RDW: 14.1 % (ref 11.5–15.5)
WBC Count: 9.2 10*3/uL (ref 4.0–10.5)
nRBC: 0 % (ref 0.0–0.2)

## 2018-07-15 LAB — CEA (IN HOUSE-CHCC): CEA (CHCC-In House): 5.01 ng/mL — ABNORMAL HIGH (ref 0.00–5.00)

## 2018-07-15 NOTE — Telephone Encounter (Signed)
Printed avs and calender of upcoming appointment. Per 12/19 los

## 2018-07-15 NOTE — Progress Notes (Signed)
Allison Dunlap   Telephone:(336) [phone] Fax:(336) [phone]   Clinic Follow up Note   Patient Care Team: Billie Ruddy, MD as PCP - General (Family Medicine) Pyrtle, Lajuan Lines, MD as Consulting Physician (Gastroenterology) Leighton Ruff, MD as Consulting Physician (General Surgery) Kyung Rudd, MD as Consulting Physician (Radiation Oncology) Truitt Merle, MD as Consulting Physician (Hematology) Michael Boston, MD as Consulting Physician (General Surgery)  Date of Service:  07/15/2018  CHIEF COMPLAINT: F/u of rectosigmoid cancer  SUMMARY OF ONCOLOGIC HISTORY: Oncology History   Cancer Staging Rectal cancer [facility]) Staging form: Colon and Rectum, AJCC 8th Edition - Clinical stage from 10/14/2016: Stage IIIB (cT3, cN1, cM0) - Signed by Truitt Merle, MD on 10/24/2016       Rectal cancer ([place])   10/14/2016 Initial Diagnosis    Rectal cancer ([place])    10/14/2016 Procedure    Colonoscopy by Dr. Hilarie Fredrickson showed a fungating partially upchucking large mass in the rectosigmoid colon, the mass begins about 10 cm from the dentate line and measured 5 cm in length. Biopsy was obtained. Multiple small and largemouth diverticula were found in the sigmoid colon and the ascending colon.    10/14/2016 Initial Biopsy    Rectosigmoid mass biopsy showed adenocarcinoma     10/15/2016 Imaging    CT of chest, abdomen and pelvis with contrast showed a 4 cm annular colonic soft tissue mass near the rectosigmoid junction, consistent with known primary carcinoma. Several small her chronic lymph nodes measuring up to 74m, suspicious for local lymph node metastasis. No evidence of distant metastasis.    11/17/2016 - 12/25/2016 Radiation Therapy     Radiation Therapy by Dr. MLisbeth Renshaw   11/17/2016 - 12/08/2016 Chemotherapy    Concurrent chemo Xeloda (15031min am and 100065mn evening) to go along with radiation.   Hold chemo due to platelet count 12/08/16    02/18/2017 Surgery    XI ROBOTIC ASSISTED LOWER  ANTERIOR RESECTION WITH SPLENIC FLEXURE MOBILIZATION and INTRAOPERATIVE ASSESSMENT OF VASCULAR PERFUSION by Dr. ThoMarcello Moores 02/18/2017 Pathology Results    Diagnosis 02/18/17 1. Spleen - BENIGN SPLEEN (152 G) - NO MALIGNANCY IDENTIFIED 2. Colon, segmental resection for tumor, rectosigmoid - RESIDUAL ADENOCARCINOMA, MODERATELY DIFFERENTIATED - MARGINS UNINVOLVED BY CARCINOMA - NO CARCINOMA IDENTIFIED IN TEN LYMPH NODES (0/10) - SEE ONCOLOGY TABLE AND COMMENT BELOW 3. Rectum, resection, distal stump - BENIGN COLONIC MUCOSA - NO CARCINOMA IDENTIFIED IN ONE LYMPH NODE (0/1) 4. Colon, resection margin (donut), final distal - BENIGN COLONIC MUCOSA - NO CARCINOMA IDENTIFIED     03/23/2017 - 07/20/2017 Chemotherapy    Xeloda 1500m11m2h, 2 weeks on and 1 weeks off, started on 03/23/17 and comeplted 6 cycles on 07/20/17     08/05/2017 Imaging    CT CAP W Contrast  IMPRESSION: 1. Interval resection of rectal mass without evidence for metastatic disease in the chest, abdomen, or pelvis. 2. 6 mm nodular opacity in the posterior right costophrenic sulcus. 3. Stable hepatic cysts.      CURRENT THERAPY:  Surveillance   INTERVAL HISTORY:  Allison Gameshere for a follow up of rectosigmoid cancer.  She presents to the clinic today by herself. She notes she is doing well. She denies any abnormal abdominal issues. She notes her BMs are regular with a new baseline. She denies bloody or black stool. She notes adequate appetite.  She notes having her last mammogram in 11/2017. She notes he sister is the only member who had  breast cancer in her family.    REVIEW OF SYSTEMS:   Constitutional: Denies fevers, chills or abnormal weight loss Eyes: Denies blurriness of vision Ears, nose, mouth, throat, and face: Denies mucositis or sore throat Respiratory: Denies cough, dyspnea or wheezes Cardiovascular: Denies palpitation, chest discomfort or lower extremity swelling Gastrointestinal:  Denies  nausea, heartburn or change in bowel habits Skin: Denies abnormal skin rashes Lymphatics: Denies new lymphadenopathy or easy bruising Neurological:Denies numbness, tingling or new weaknesses Behavioral/Psych: Mood is stable, no new changes  All other systems were reviewed with the patient and are negative.  MEDICAL HISTORY:  Past Medical History:  Diagnosis Date  . Arthritis   . Blood in stool 2018  . Cancer ([place]) 10/14/2016   rectal cancer-adenocarcinoma  . Clotting disorder ([place])   . Temporary low platelet count ([place])     SURGICAL HISTORY: Past Surgical History:  Procedure Laterality Date  . COLON SURGERY    . COLONOSCOPY     with propofol   . INTRAOPERATIVE CHOLANGIOGRAM  02/18/2017   Procedure: INTRAOPERATIVE ASSESSMENT OF VASCULAR PERFUSION;  Surgeon: Leighton Ruff, MD;  Location: [facility];  Service: General;;  . LAPAROSCOPIC SPLENECTOMY  02/18/2017   Procedure: XI ROBOTIC ASSISTED SPLENECTOMY;  Surgeon: Michael Boston, MD;  Location: [facility];  Service: General;;  . NO PAST SURGERIES    . XI ROBOTIC ASSISTED LOWER ANTERIOR RESECTION N/A 02/18/2017   Procedure: XI ROBOTIC ASSISTED LOWER ANTERIOR RESECTION WITH SPLENIC FLEXURE MOBILIZATION;  Surgeon: Leighton Ruff, MD;  Location: [facility];  Service: General;  Laterality: N/A;    I have reviewed the social history and family history with the patient and they are unchanged from previous note.  ALLERGIES:  has No Known Allergies.  MEDICATIONS:  Current Outpatient Medications  Medication Sig Dispense Refill  . cholecalciferol (VITAMIN D) 1000 units tablet Take 1 tablet by mouth daily.    Marland Kitchen ibuprofen (ADVIL,MOTRIN) 200 MG tablet Take 1-2 tablets (200-400 mg total) by mouth every 6 (six) hours as needed (for pain).    . Multiple Vitamins-Minerals (CENTRUM SILVER 50+WOMEN) TABS      No current facility-administered medications for this visit.     PHYSICAL EXAMINATION: ECOG PERFORMANCE STATUS: 0 - Asymptomatic  Vitals:    07/15/18 1304  BP: 131/84  Pulse: 65  Resp: 18  Temp: 98 F (36.7 C)  SpO2: 99%   Filed Weights   07/15/18 1304  Weight: 119 lb (54 kg)    GENERAL:alert, no distress and comfortable SKIN: skin color, texture, turgor are normal, no significant lesions (+) mild lower back rash EYES: normal, Conjunctiva are pink and non-injected, sclera clear OROPHARYNX:no exudate, no erythema and lips, buccal mucosa, and tongue normal  NECK: supple, thyroid normal size, non-tender, without nodularity LYMPH:  no palpable lymphadenopathy in the cervical, axillary or inguinal LUNGS: clear to auscultation and percussion with normal breathing effort HEART: regular rate & rhythm and no murmurs and no lower extremity edema ABDOMEN:abdomen soft, non-tender and normal bowel sounds Musculoskeletal:no cyanosis of digits and no clubbing  NEURO: alert & oriented x 3 with fluent speech, no focal motor/sensory deficits RECTAL EXAM: No palpable mass, normal stool color, no blood on glove  LABORATORY DATA:  I have reviewed the data as listed CBC Latest Ref Rng & Units 07/15/2018 03/18/2018 11/05/2017  WBC 4.0 - 10.5 K/uL 9.2 7.5 7.6  Hemoglobin 12.0 - 15.0 g/dL 14.0 13.2 14.4  Hematocrit 36.0 - 46.0 % 43.8 40.0 44.4  Platelets 150 - 400 K/uL 407(H) 383  391     CMP Latest Ref Rng & Units 07/15/2018 03/18/2018 11/05/2017  Glucose 70 - 99 mg/dL 88 91 133  BUN 8 - 23 mg/dL 13 17 15   Creatinine 0.44 - 1.00 mg/dL 0.74 0.68 0.75  Sodium 135 - 145 mmol/L 143 141 140  Potassium 3.5 - 5.1 mmol/L 4.0 4.1 4.3  Chloride 98 - 111 mmol/L 105 106 106  CO2 22 - 32 mmol/L 27 26 26   Calcium 8.9 - 10.3 mg/dL 9.5 9.6 9.5  Total Protein 6.5 - 8.1 g/dL 7.7 7.2 7.2  Total Bilirubin 0.3 - 1.2 mg/dL 0.4 0.2(L) 0.4  Alkaline Phos 38 - 126 U/L 106 103 104  AST 15 - 41 U/L 24 19 20   ALT 0 - 44 U/L 19 16 13       RADIOGRAPHIC STUDIES: I have personally reviewed the radiological images as listed and agreed with the findings in the  report. No results found.   ASSESSMENT & PLAN:  Allison Dunlap is a 67 y.o. female with   1. Rectosigmoid adenocarcinoma, cT3N1M0, stage IIIB, ypT2N0M0, MMR normal  -He was diagnosed in 09/2016. She underwent neoadjuvant concurrent chemoradiation, surgical resection and adjuvant Xeloda.  -She is clinically doing well. Labs reviewed, plt at 407K otherwise CBC WNL. CMP and CEA are still pending. Exam was unremarkable.  No clinical concern for recurrence -Continue with surveillance. Next scan in January  -F/u in 4 months  2. 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 This is most consistent with ITP.  -She is status post splenectomy, had a great response, her thrombopenia is resolved.   3. History of ulcerative colitis -Continue to follow up with Dr. Hilarie Fredrickson   4. Cancer screening -Her 11/2017 Mammogram was negative. Continue yearly  -Continue age appropriate cancer screenings.    Plan -She is clinically doing well, continue survaillance.  -Lab and f/u in 4 months  -Surveillance CT CAP In January, I will call her with results     No problem-specific Assessment & Plan notes found for this encounter.   Orders Placed This Encounter  Procedures  . CT Abdomen Pelvis W Contrast    Standing Status:   Future    Standing Expiration Date:   07/15/2019    Order Specific Question:   If indicated for the ordered procedure, I authorize the administration of contrast media per Radiology protocol    Answer:   Yes    Order Specific Question:   Preferred imaging location?    Answer:   [facility]    Order Specific Question:   Is Oral Contrast requested for this exam?    Answer:   Yes, Per Radiology protocol    Order Specific Question:   Radiology Contrast Protocol - do NOT remove file path    Answer:    \\[email or web address]  . CT Chest W Contrast    Standing Status:   Future    Standing Expiration Date:   07/15/2019    Order Specific Question:   If indicated for the ordered procedure, I authorize the administration of contrast media per Radiology protocol    Answer:   Yes    Order Specific Question:   Preferred imaging location?    Answer:   [facility]    Order Specific Question:   Radiology Contrast Protocol - do NOT remove file path    Answer:   \\  [email or web address]   All questions were answered. The patient knows to call the clinic with any problems, questions or concerns. No barriers to learning was detected. I spent 20 minutes counseling the patient face to face. The total time spent in the appointment was 25 minutes and more than 50% was on counseling and review of test results     Truitt Merle, MD 07/15/2018   I, Joslyn Devon, am acting as scribe for Truitt Merle, MD.   I have reviewed the above documentation for accuracy and completeness, and I agree with the above.

## 2018-08-01 DIAGNOSIS — R69 Illness, unspecified: Secondary | ICD-10-CM | POA: Diagnosis not present

## 2018-08-05 ENCOUNTER — Ambulatory Visit (HOSPITAL_COMMUNITY)
Admission: RE | Admit: 2018-08-05 | Discharge: 2018-08-05 | Disposition: A | Discharge status: Home / Self Care | Payer: Medicare HMO | Source: Ambulatory Visit | Attending: Hematology | Admitting: Hematology

## 2018-08-05 DIAGNOSIS — C20 Malignant neoplasm of rectum: Secondary | ICD-10-CM | POA: Insufficient documentation

## 2018-08-05 DIAGNOSIS — C19 Malignant neoplasm of rectosigmoid junction: Secondary | ICD-10-CM | POA: Diagnosis not present

## 2018-08-05 MED ORDER — SODIUM CHLORIDE (PF) 0.9 % IJ SOLN
INTRAMUSCULAR | Status: AC
  Filled 2018-08-05: qty 50

## 2018-08-05 MED ORDER — IOHEXOL 300 MG/ML  SOLN
100.0000 mL | Freq: Once | INTRAMUSCULAR | Status: AC | PRN
  Administered 2018-08-05: 100 mL via INTRAVENOUS

## 2018-08-09 DIAGNOSIS — R69 Illness, unspecified: Secondary | ICD-10-CM | POA: Diagnosis not present

## 2018-08-10 ENCOUNTER — Telehealth: Payer: Self-pay

## 2018-08-10 NOTE — Telephone Encounter (Signed)
-----   Message from Truitt Merle, MD sent at 08/08/2018  7:48 PM EST ----- Please let pt know the scan results, no concerns, thanks   Truitt Merle  08/08/2018

## 2018-08-10 NOTE — Telephone Encounter (Signed)
Spoke with patient regarding scan results, notified her per Dr. Burr Medico scan was negative, no concerns, patient verbalized an understanding.

## 2018-10-27 DIAGNOSIS — L723 Sebaceous cyst: Secondary | ICD-10-CM | POA: Diagnosis not present

## 2018-10-28 ENCOUNTER — Ambulatory Visit: Payer: Medicare HMO | Admitting: Family Medicine

## 2018-11-09 ENCOUNTER — Telehealth: Payer: Self-pay | Admitting: Hematology

## 2018-11-09 NOTE — Telephone Encounter (Signed)
Spoke with the patient and she preferred a phone call visit with Dr. Burr Medico.

## 2018-11-10 NOTE — Progress Notes (Signed)
[facility]   Telephone:(336) [phone] Fax:(336) [phone]   Clinic Follow up Note   Patient Care Team: Billie Ruddy, MD as PCP - General (Family Medicine) Pyrtle, Lajuan Lines, MD as Consulting Physician (Gastroenterology) Leighton Ruff, MD as Consulting Physician (General Surgery) Kyung Rudd, MD as Consulting Physician (Radiation Oncology) Truitt Merle, MD as Consulting Physician (Hematology) Michael Boston, MD as Consulting Physician (General Surgery)   I connected with Allison Dunlap on 11/11/2018 at  1:45 PM EDT by telephone visit and verified that I am speaking with the correct person using two identifiers.  I discussed the limitations, risks, security and privacy concerns of performing an evaluation and management service by telephone and the availability of in person appointments. I also discussed with the patient that there may be a patient responsible charge related to this service. The patient expressed understanding and agreed to proceed.   Other persons participating in the visit and their role in the encounter: none   Patient's location:  Her home  Provider's location:  My Office  CHIEF COMPLAINT:  F/u of rectosigmoid cancer  SUMMARY OF ONCOLOGIC HISTORY: Oncology History   Cancer Staging Rectal cancer [facility]) Staging form: Colon and Rectum, AJCC 8th Edition - Clinical stage from 10/14/2016: Stage IIIB (cT3, cN1, cM0) - Signed by Truitt Merle, MD on 10/24/2016       Rectal cancer ([place])   10/14/2016 Initial Diagnosis    Rectal cancer ([place])    10/14/2016 Procedure    Colonoscopy by Dr. Hilarie Fredrickson showed a fungating partially upchucking large mass in the rectosigmoid colon, the mass begins about 10 cm from the dentate line and measured 5 cm in length. Biopsy was obtained. Multiple small and largemouth diverticula were found in the sigmoid colon and the ascending colon.    10/14/2016 Initial Biopsy    Rectosigmoid mass biopsy showed adenocarcinoma     10/15/2016 Imaging    CT of chest, abdomen and pelvis with contrast showed a 4 cm annular colonic soft tissue mass near the rectosigmoid junction, consistent with known primary carcinoma. Several small her chronic lymph nodes measuring up to 60m, suspicious for local lymph node metastasis. No evidence of distant metastasis.    11/17/2016 - 12/25/2016 Radiation Therapy     Radiation Therapy by Dr. MLisbeth Renshaw   11/17/2016 - 12/08/2016 Chemotherapy    Concurrent chemo Xeloda (15022min am and 100053mn evening) to go along with radiation.   Hold chemo due to platelet count 12/08/16    02/18/2017 Surgery    XI ROBOTIC ASSISTED LOWER ANTERIOR RESECTION WITH SPLENIC FLEXURE MOBILIZATION and INTRAOPERATIVE ASSESSMENT OF VASCULAR PERFUSION by Dr. ThoMarcello Moores 02/18/2017 Pathology Results    Diagnosis 02/18/17 1. Spleen - BENIGN SPLEEN (152 G) - NO MALIGNANCY IDENTIFIED 2. Colon, segmental resection for tumor, rectosigmoid - RESIDUAL ADENOCARCINOMA, MODERATELY DIFFERENTIATED - MARGINS UNINVOLVED BY CARCINOMA - NO CARCINOMA IDENTIFIED IN TEN LYMPH NODES (0/10) - SEE ONCOLOGY TABLE AND COMMENT BELOW 3. Rectum, resection, distal stump - BENIGN COLONIC MUCOSA - NO CARCINOMA IDENTIFIED IN ONE LYMPH NODE (0/1) 4. Colon, resection margin (donut), final distal - BENIGN COLONIC MUCOSA - NO CARCINOMA IDENTIFIED     03/23/2017 - 07/20/2017 Chemotherapy    Xeloda 1500m42m2h, 2 weeks on and 1 weeks off, started on 03/23/17 and comeplted 6 cycles on 07/20/17     08/05/2017 Imaging    CT CAP W Contrast  IMPRESSION: 1. Interval resection of rectal mass without evidence for metastatic disease in the  chest, abdomen, or pelvis. 2. 6 mm nodular opacity in the posterior right costophrenic sulcus. 3. Stable hepatic cysts.      CURRENT THERAPY:  Surveillance   INTERVAL HISTORY:  Allison Dunlap is here for a follow up of rectosigmoid cancer. She  was able to identify herself by birth date. She notes she is  doing well. She notes no new changes since the last visit. She denies abdominal issues, pain and has regular BMs. She is overall at baseline.  She still has PCP and will postpone her mammogram until the summer given COVID-19.   REVIEW OF SYSTEMS:   Constitutional: Denies fevers, chills or abnormal weight loss Eyes: Denies blurriness of vision Ears, nose, mouth, throat, and face: Denies mucositis or sore throat Respiratory: Denies cough, dyspnea or wheezes Cardiovascular: Denies palpitation, chest discomfort or lower extremity swelling Gastrointestinal:  Denies nausea, heartburn or change in bowel habits Skin: Denies abnormal skin rashes Lymphatics: Denies new lymphadenopathy or easy bruising Neurological:Denies numbness, tingling or new weaknesses Behavioral/Psych: Mood is stable, no new changes  All other systems were reviewed with the patient and are negative.  MEDICAL HISTORY:  Past Medical History:  Diagnosis Date   Arthritis    Blood in stool 2018   Cancer ([place]) 10/14/2016   rectal cancer-adenocarcinoma   Clotting disorder ([place])    Temporary low platelet count ([place])     SURGICAL HISTORY: Past Surgical History:  Procedure Laterality Date   COLON SURGERY     COLONOSCOPY     with propofol    INTRAOPERATIVE CHOLANGIOGRAM  02/18/2017   Procedure: INTRAOPERATIVE ASSESSMENT OF VASCULAR PERFUSION;  Surgeon: Leighton Ruff, MD;  Location: [facility];  Service: General;;   LAPAROSCOPIC SPLENECTOMY  02/18/2017   Procedure: XI ROBOTIC ASSISTED SPLENECTOMY;  Surgeon: Michael Boston, MD;  Location: [facility];  Service: General;;   NO PAST SURGERIES     XI ROBOTIC ASSISTED LOWER ANTERIOR RESECTION N/A 02/18/2017   Procedure: XI ROBOTIC ASSISTED LOWER ANTERIOR RESECTION WITH SPLENIC FLEXURE MOBILIZATION;  Surgeon: Leighton Ruff, MD;  Location: [facility];  Service: General;  Laterality: N/A;    I have reviewed the social history and family history with the patient and they are unchanged  from previous note.  ALLERGIES:  has No Known Allergies.  MEDICATIONS:  Current Outpatient Medications  Medication Sig Dispense Refill   cholecalciferol (VITAMIN D) 1000 units tablet Take 1 tablet by mouth daily.     ibuprofen (ADVIL,MOTRIN) 200 MG tablet Take 1-2 tablets (200-400 mg total) by mouth every 6 (six) hours as needed (for pain).     Multiple Vitamins-Minerals (CENTRUM SILVER 50+WOMEN) TABS      No current facility-administered medications for this visit.     PHYSICAL EXAMINATION: ECOG PERFORMANCE STATUS: 0 - Asymptomatic  ** No vitals taken today, Exam not performed today **  LABORATORY DATA:  I have reviewed the data as listed CBC Latest Ref Rng & Units 07/15/2018 03/18/2018 11/05/2017  WBC 4.0 - 10.5 K/uL 9.2 7.5 7.6  Hemoglobin 12.0 - 15.0 g/dL 14.0 13.2 14.4  Hematocrit 36.0 - 46.0 % 43.8 40.0 44.4  Platelets 150 - 400 K/uL 407(H) 383 391     CMP Latest Ref Rng & Units 07/15/2018 03/18/2018 11/05/2017  Glucose 70 - 99 mg/dL 88 91 133  BUN 8 - 23 mg/dL 13 17 15   Creatinine 0.44 - 1.00 mg/dL 0.74 0.68 0.75  Sodium 135 - 145 mmol/L 143 141 140  Potassium 3.5 - 5.1 mmol/L 4.0 4.1 4.3  Chloride 98 - 111 mmol/L 105 106 106  CO2 22 - 32 mmol/L 27 26 26   Calcium 8.9 - 10.3 mg/dL 9.5 9.6 9.5  Total Protein 6.5 - 8.1 g/dL 7.7 7.2 7.2  Total Bilirubin 0.3 - 1.2 mg/dL 0.4 0.2(L) 0.4  Alkaline Phos 38 - 126 U/L 106 103 104  AST 15 - 41 U/L 24 19 20   ALT 0 - 44 U/L 19 16 13       RADIOGRAPHIC STUDIES: I have personally reviewed the radiological images as listed and agreed with the findings in the report. No results found.   ASSESSMENT & PLAN:  Allison Dunlap is a 68 y.o. female with   1. Rectosigmoid adenocarcinoma, cT3N1M0, stage IIIB, ypT2N0M0, MMR normal  -He was diagnosed in 09/2016. She underwent neoadjuvant concurrent chemoradiation, surgical resection and adjuvant Xeloda.  -She is clinically doing well. No clinical concern for recurrence. She is  2 years since her diagnosis. Her risk of recurrence has decreased.  -Continue with surveillance with f/u every 3-4 months for another year. Last surveillance scan will be in 07/2019 and then f/u every 6-12 months to complete 5 year surveillance.   2. 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 This is most consistent with ITP.  -She is status post splenectomy, had a great response, her thrombopenia is resolved.  3. History of ulcerative colitis -Continue to follow up with Dr. Hilarie Fredrickson   4. Cancer screening -Her 11/2017 Mammogram was negative. Continue yearly. She will postpone mammogram to summer 2020 due to COVID-19.  -Continue age appropriate cancer screenings.    Plan -She is clinically doing well, continue surveillance.  -Lab and f/u in 3 months  No problem-specific Assessment & Plan notes found for this encounter.   No orders of the defined types were placed in this encounter.  I discussed the assessment and treatment plan with the patient. The patient was provided an opportunity to ask questions and all were answered. The patient agreed with the plan and demonstrated an understanding of the instructions.  The patient was advised to call back or seek an in-person evaluation if the symptoms worsen or if the condition fails to improve as anticipated.   I provided 8 minutes of non face-to-face telephone visit time during this encounter, and > 50% was spent counseling as documented under my assessment & plan.    Truitt Merle, MD 11/11/2018   I, Joslyn Devon, am acting as scribe for Truitt Merle, MD.   I have reviewed the above documentation for accuracy and completeness, and I agree with the above.

## 2018-11-11 ENCOUNTER — Other Ambulatory Visit: Payer: Medicare HMO

## 2018-11-11 ENCOUNTER — Telehealth: Payer: Self-pay | Admitting: Hematology

## 2018-11-11 ENCOUNTER — Inpatient Hospital Stay: Payer: Medicare HMO | Attending: Hematology | Admitting: Hematology

## 2018-11-11 ENCOUNTER — Encounter: Payer: Self-pay | Admitting: Hematology

## 2018-11-11 DIAGNOSIS — C20 Malignant neoplasm of rectum: Secondary | ICD-10-CM

## 2018-11-11 NOTE — Telephone Encounter (Signed)
Scheduled appt per 4/16 los.

## 2019-01-25 DIAGNOSIS — R69 Illness, unspecified: Secondary | ICD-10-CM | POA: Diagnosis not present

## 2019-02-07 NOTE — Progress Notes (Signed)
[place]   Telephone:(336) [phone] Fax:(336) [phone]   Clinic Follow up Note   Patient Care Team: Billie Ruddy, MD as PCP - General (Family Medicine) Pyrtle, Lajuan Lines, MD as Consulting Physician (Gastroenterology) Leighton Ruff, MD as Consulting Physician (General Surgery) Kyung Rudd, MD as Consulting Physician (Radiation Oncology) Truitt Merle, MD as Consulting Physician (Hematology) Michael Boston, MD as Consulting Physician (General Surgery)  Date of Service:  02/10/2019  CHIEF COMPLAINT: F/u of rectosigmoid cancer  SUMMARY OF ONCOLOGIC HISTORY: Oncology History Overview Note  Cancer Staging Rectal cancer [facility]) Staging form: Colon and Rectum, AJCC 8th Edition - Clinical stage from 10/14/2016: Stage IIIB (cT3, cN1, cM0) - Signed by Truitt Merle, MD on 10/24/2016     Rectal cancer ([place])  10/14/2016 Initial Diagnosis   Rectal cancer ([place])   10/14/2016 Procedure   Colonoscopy by Dr. Hilarie Fredrickson showed a fungating partially upchucking large mass in the rectosigmoid colon, the mass begins about 10 cm from the dentate line and measured 5 cm in length. Biopsy was obtained. Multiple small and largemouth diverticula were found in the sigmoid colon and the ascending colon.   10/14/2016 Initial Biopsy   Rectosigmoid mass biopsy showed adenocarcinoma    10/15/2016 Imaging   CT of chest, abdomen and pelvis with contrast showed a 4 cm annular colonic soft tissue mass near the rectosigmoid junction, consistent with known primary carcinoma. Several small her chronic lymph nodes measuring up to 38m, suspicious for local lymph node metastasis. No evidence of distant metastasis.   11/17/2016 - 12/25/2016 Radiation Therapy    Radiation Therapy by Dr. MLisbeth Renshaw  11/17/2016 - 12/08/2016 Chemotherapy   Concurrent chemo Xeloda (15061min am and 10009mn evening) to go along with radiation.   Hold chemo due to platelet count 12/08/16   02/18/2017 Surgery   XI ROBOTIC ASSISTED LOWER ANTERIOR  RESECTION WITH SPLENIC FLEXURE MOBILIZATION and INTRAOPERATIVE ASSESSMENT OF VASCULAR PERFUSION by Dr. ThoMarcello Moores7/25/2018 Pathology Results   Diagnosis 02/18/17 1. Spleen - BENIGN SPLEEN (152 G) - NO MALIGNANCY IDENTIFIED 2. Colon, segmental resection for tumor, rectosigmoid - RESIDUAL ADENOCARCINOMA, MODERATELY DIFFERENTIATED - MARGINS UNINVOLVED BY CARCINOMA - NO CARCINOMA IDENTIFIED IN TEN LYMPH NODES (0/10) - SEE ONCOLOGY TABLE AND COMMENT BELOW 3. Rectum, resection, distal stump - BENIGN COLONIC MUCOSA - NO CARCINOMA IDENTIFIED IN ONE LYMPH NODE (0/1) 4. Colon, resection margin (donut), final distal - BENIGN COLONIC MUCOSA - NO CARCINOMA IDENTIFIED    03/23/2017 - 07/20/2017 Chemotherapy   Xeloda 1500m46m2h, 2 weeks on and 1 weeks off, started on 03/23/17 and comeplted 6 cycles on 07/20/17    08/05/2017 Imaging   CT CAP W Contrast  IMPRESSION: 1. Interval resection of rectal mass without evidence for metastatic disease in the chest, abdomen, or pelvis. 2. 6 mm nodular opacity in the posterior right costophrenic sulcus. 3. Stable hepatic cysts.      CURRENT THERAPY:  Surveillance  INTERVAL HISTORY:  Allison Modisettehere for a follow up rectosigmoid cancer. She presents to the clinic alone. She notes she is doing well. She denies any new changes. She is eating well and has adequate energy. She denies abdominal issues. Overall no concerns. She notes she stays active with her Pony and riding as well as walking.     REVIEW OF SYSTEMS:   Constitutional: Denies fevers, chills or abnormal weight loss Eyes: Denies blurriness of vision Ears, nose, mouth, throat, and face: Denies mucositis or sore throat Respiratory: Denies cough, dyspnea  or wheezes Cardiovascular: Denies palpitation, chest discomfort or lower extremity swelling Gastrointestinal:  Denies nausea, heartburn or change in bowel habits Skin: Denies abnormal skin rashes Lymphatics: Denies new  lymphadenopathy or easy bruising Neurological:Denies numbness, tingling or new weaknesses Behavioral/Psych: Mood is stable, no new changes  All other systems were reviewed with the patient and are negative.  MEDICAL HISTORY:  Past Medical History:  Diagnosis Date  . Arthritis   . Blood in stool 2018  . Cancer ([place]) 10/14/2016   rectal cancer-adenocarcinoma  . Clotting disorder ([place])   . Temporary low platelet count ([place])     SURGICAL HISTORY: Past Surgical History:  Procedure Laterality Date  . COLON SURGERY    . COLONOSCOPY     with propofol   . INTRAOPERATIVE CHOLANGIOGRAM  02/18/2017   Procedure: INTRAOPERATIVE ASSESSMENT OF VASCULAR PERFUSION;  Surgeon: Leighton Ruff, MD;  Location: [facility];  Service: General;;  . LAPAROSCOPIC SPLENECTOMY  02/18/2017   Procedure: XI ROBOTIC ASSISTED SPLENECTOMY;  Surgeon: Michael Boston, MD;  Location: [facility];  Service: General;;  . NO PAST SURGERIES    . XI ROBOTIC ASSISTED LOWER ANTERIOR RESECTION N/A 02/18/2017   Procedure: XI ROBOTIC ASSISTED LOWER ANTERIOR RESECTION WITH SPLENIC FLEXURE MOBILIZATION;  Surgeon: Leighton Ruff, MD;  Location: [facility];  Service: General;  Laterality: N/A;    I have reviewed the social history and family history with the patient and they are unchanged from previous note.  ALLERGIES:  has No Known Allergies.  MEDICATIONS:  Current Outpatient Medications  Medication Sig Dispense Refill  . BLACK PEPPER-TURMERIC PO Take by mouth.    . cholecalciferol (VITAMIN D) 1000 units tablet Take 1 tablet by mouth daily.    . Multiple Vitamins-Minerals (CENTRUM SILVER 50+WOMEN) TABS     . ibuprofen (ADVIL,MOTRIN) 200 MG tablet Take 1-2 tablets (200-400 mg total) by mouth every 6 (six) hours as needed (for pain). (Patient not taking: Reported on 02/10/2019)     No current facility-administered medications for this visit.     PHYSICAL EXAMINATION: ECOG PERFORMANCE STATUS: 0 - Asymptomatic  Vitals:   02/10/19 1336  BP:  134/78  Pulse: 60  Resp: 18  Temp: 97.8 F (36.6 C)  SpO2: 99%   Filed Weights   02/10/19 1336  Weight: 115 lb 6.4 oz (52.3 kg)    GENERAL:alert, no distress and comfortable SKIN: skin color, texture, turgor are normal, no rashes or significant lesions EYES: normal, Conjunctiva are pink and non-injected, sclera clear  NECK: supple, thyroid normal size, non-tender, without nodularity LYMPH:  no palpable lymphadenopathy in the cervical, axillary or inguinal LUNGS: clear to auscultation and percussion with normal breathing effort HEART: regular rate & rhythm and no murmurs and no lower extremity edema ABDOMEN:abdomen soft, non-tender and normal bowel sounds. No organomegaly (+) S/p splenectomy Musculoskeletal:no cyanosis of digits and no clubbing  NEURO: alert & oriented x 3 with fluent speech, no focal motor/sensory deficits RECTAL: (+) Mild skin Erythema of buttocks No palpable mass. No blood on glove. Normal stool present. Benign exam   LABORATORY DATA:  I have reviewed the data as listed CBC Latest Ref Rng & Units 02/10/2019 07/15/2018 03/18/2018  WBC 4.0 - 10.5 K/uL 8.6 9.2 7.5  Hemoglobin 12.0 - 15.0 g/dL 14.0 14.0 13.2  Hematocrit 36.0 - 46.0 % 42.2 43.8 40.0  Platelets 150 - 400 K/uL 380 407(H) 383     CMP Latest Ref Rng & Units 02/10/2019 07/15/2018 03/18/2018  Glucose 70 - 99 mg/dL 94 88 91  BUN 8 - 23 mg/dL 16 13 17   Creatinine 0.44 - 1.00 mg/dL 0.74 0.74 0.68  Sodium 135 - 145 mmol/L 142 143 141  Potassium 3.5 - 5.1 mmol/L 3.9 4.0 4.1  Chloride 98 - 111 mmol/L 106 105 106  CO2 22 - 32 mmol/L 26 27 26   Calcium 8.9 - 10.3 mg/dL 9.3 9.5 9.6  Total Protein 6.5 - 8.1 g/dL 7.4 7.7 7.2  Total Bilirubin 0.3 - 1.2 mg/dL 0.5 0.4 0.2(L)  Alkaline Phos 38 - 126 U/L 88 106 103  AST 15 - 41 U/L 26 24 19   ALT 0 - 44 U/L 19 19 16       RADIOGRAPHIC STUDIES: I have personally reviewed the radiological images as listed and agreed with the findings in the report. No results  found.   ASSESSMENT & PLAN:  Allison Dunlap is a 68 y.o. female with   1. Rectosigmoid adenocarcinoma, cT3N1M0, stage IIIB, ypT2N0M0, MMR normal -He was diagnosed in 09/2016.She underwent neoadjuvantconcurrent chemoradiation, surgical resection and adjuvant Xeloda. -Surveillance 01/2018 colonoscopy, she had diverticulosis and 1 benign polyp removed, will repeat in 2022.  -She is clinically doing very well. Rectal exam unremarkable. No clinical concern for recurrence. She is over 2 years since her diagnosis. Her risk of recurrence has decreased.  -Continue with surveillance with f/u every 4 months for another year. Per patient request, will skip last surveillance CT scan next year, but if she develops concerning symptoms will scan her. Will continue f/u to complete 5 year surveillance.  -F/u with virtual visit in 4 months.   2. 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 This is most consistent with ITP.  -She is status post splenectomy, had a great response, her thrombopeniaisresolved.  3. History of ulcerative colitis -Continue to follow up with Dr. Hilarie Fredrickson  -No current GI issues.   4. Cancer screening -Her 11/2017 Mammogram was negative. Continue yearly. She will postpone mammogram to summer 2020 due to COVID-19.  -Continue age appropriate cancer screenings.   Plan -She is clinically doing well, continue surveillance.  -Virtual visit in 4 months  -Lab and f/u in 8 months  No problem-specific Assessment & Plan notes found for this encounter.   No orders of the defined types were placed in this encounter.  All questions were answered. The patient knows to call the clinic with any problems, questions or concerns. No barriers to learning was detected. I spent 10 minutes counseling the patient face to face. The total time spent in  the appointment was 15 minutes and more than 50% was on counseling and review of test results     Truitt Merle, MD 02/10/2019   I, Joslyn Devon, am acting as scribe for Truitt Merle, MD.   I have reviewed the above documentation for accuracy and completeness, and I agree with the above.

## 2019-02-10 ENCOUNTER — Other Ambulatory Visit: Payer: Self-pay

## 2019-02-10 ENCOUNTER — Inpatient Hospital Stay (HOSPITAL_BASED_OUTPATIENT_CLINIC_OR_DEPARTMENT_OTHER): Payer: Medicare HMO | Admitting: Hematology

## 2019-02-10 ENCOUNTER — Encounter: Payer: Self-pay | Admitting: Hematology

## 2019-02-10 ENCOUNTER — Inpatient Hospital Stay: Payer: Medicare HMO | Attending: Hematology

## 2019-02-10 VITALS — BP 134/78 | HR 60 | Temp 97.8°F | Resp 18 | Ht 59.0 in | Wt 115.4 lb

## 2019-02-10 DIAGNOSIS — Z85048 Personal history of other malignant neoplasm of rectum, rectosigmoid junction, and anus: Secondary | ICD-10-CM

## 2019-02-10 DIAGNOSIS — K7689 Other specified diseases of liver: Secondary | ICD-10-CM

## 2019-02-10 DIAGNOSIS — Z923 Personal history of irradiation: Secondary | ICD-10-CM | POA: Diagnosis not present

## 2019-02-10 DIAGNOSIS — Z9081 Acquired absence of spleen: Secondary | ICD-10-CM | POA: Diagnosis not present

## 2019-02-10 DIAGNOSIS — Z79899 Other long term (current) drug therapy: Secondary | ICD-10-CM

## 2019-02-10 DIAGNOSIS — M129 Arthropathy, unspecified: Secondary | ICD-10-CM | POA: Insufficient documentation

## 2019-02-10 DIAGNOSIS — D689 Coagulation defect, unspecified: Secondary | ICD-10-CM | POA: Diagnosis not present

## 2019-02-10 DIAGNOSIS — Z8719 Personal history of other diseases of the digestive system: Secondary | ICD-10-CM

## 2019-02-10 DIAGNOSIS — Z9221 Personal history of antineoplastic chemotherapy: Secondary | ICD-10-CM

## 2019-02-10 DIAGNOSIS — C20 Malignant neoplasm of rectum: Secondary | ICD-10-CM

## 2019-02-10 LAB — CMP (CANCER CENTER ONLY)
ALT: 19 U/L (ref 0–44)
AST: 26 U/L (ref 15–41)
Albumin: 3.8 g/dL (ref 3.5–5.0)
Alkaline Phosphatase: 88 U/L (ref 38–126)
Anion gap: 10 (ref 5–15)
BUN: 16 mg/dL (ref 8–23)
CO2: 26 mmol/L (ref 22–32)
Calcium: 9.3 mg/dL (ref 8.9–10.3)
Chloride: 106 mmol/L (ref 98–111)
Creatinine: 0.74 mg/dL (ref 0.44–1.00)
GFR, Est AFR Am: >60 mL/min (ref >60)
GFR, Estimated: >60 mL/min (ref >60)
Glucose, Bld: 94 mg/dL (ref 70–99)
Potassium: 3.9 mmol/L (ref 3.5–5.1)
Sodium: 142 mmol/L (ref 135–145)
Total Bilirubin: 0.5 mg/dL (ref 0.3–1.2)
Total Protein: 7.4 g/dL (ref 6.5–8.1)

## 2019-02-10 LAB — CBC WITH DIFFERENTIAL (CANCER CENTER ONLY)
Abs Immature Granulocytes: 0.02 10*3/uL (ref 0.00–0.07)
Basophils Absolute: 0.1 10*3/uL (ref 0.0–0.1)
Basophils Relative: 1 %
Eosinophils Absolute: 0.2 10*3/uL (ref 0.0–0.5)
Eosinophils Relative: 2 %
HCT: 42.2 % (ref 36.0–46.0)
Hemoglobin: 14 g/dL (ref 12.0–15.0)
Immature Granulocytes: 0 %
Lymphocytes Relative: 35 %
Lymphs Abs: 3 10*3/uL (ref 0.7–4.0)
MCH: 32.6 pg (ref 26.0–34.0)
MCHC: 33.2 g/dL (ref 30.0–36.0)
MCV: 98.4 fL (ref 80.0–100.0)
Monocytes Absolute: 0.8 10*3/uL (ref 0.1–1.0)
Monocytes Relative: 9 %
Neutro Abs: 4.5 10*3/uL (ref 1.7–7.7)
Neutrophils Relative %: 53 %
Platelet Count: 380 10*3/uL (ref 150–400)
RBC: 4.29 MIL/uL (ref 3.87–5.11)
RDW: 12.9 % (ref 11.5–15.5)
WBC Count: 8.6 10*3/uL (ref 4.0–10.5)
nRBC: 0 % (ref 0.0–0.2)

## 2019-02-10 LAB — CEA (IN HOUSE-CHCC): CEA (CHCC-In House): 3.09 ng/mL (ref 0.00–5.00)

## 2019-02-11 ENCOUNTER — Telehealth: Payer: Self-pay | Admitting: Hematology

## 2019-02-11 NOTE — Telephone Encounter (Signed)
Scheduled appt per 7/16 los.  Spoke with patient and she is aware of her appt date and time.

## 2019-02-14 ENCOUNTER — Telehealth: Payer: Self-pay

## 2019-02-14 NOTE — Telephone Encounter (Signed)
Left voice message on patient identified voice mail box regarding lab results.  Per Dr. Burr Medico tumor marker CEA was WNL last week, no concerns.

## 2019-02-14 NOTE — Telephone Encounter (Signed)
-----   Message from Truitt Merle, MD sent at 02/14/2019 11:49 AM EDT ----- Please let her know her tumor marker CEA was WNL last week, no concerns, thanks   Truitt Merle  02/14/2019

## 2019-04-24 DIAGNOSIS — R69 Illness, unspecified: Secondary | ICD-10-CM | POA: Diagnosis not present

## 2019-06-08 NOTE — Progress Notes (Signed)
Allison Dunlap   Telephone:(336) [phone] Fax:(336) [phone]   Clinic Follow up Note   Patient Care Team: Billie Ruddy, MD as PCP - General (Family Medicine) Pyrtle, Lajuan Lines, MD as Consulting Physician (Gastroenterology) Leighton Ruff, MD as Consulting Physician (General Surgery) Kyung Rudd, MD as Consulting Physician (Radiation Oncology) Truitt Merle, MD as Consulting Physician (Hematology) Michael Boston, MD as Consulting Physician (General Surgery)    CHIEF COMPLAINT: F/u of rectosigmoid cancer  SUMMARY OF ONCOLOGIC HISTORY: Oncology History Overview Note  Cancer Staging Rectal cancer [facility]) Staging form: Colon and Rectum, AJCC 8th Edition - Clinical stage from 10/14/2016: Stage IIIB (cT3, cN1, cM0) - Signed by Truitt Merle, MD on 10/24/2016     Rectal cancer ([place])  10/14/2016 Initial Diagnosis   Rectal cancer ([place])   10/14/2016 Procedure   Colonoscopy by Dr. Hilarie Fredrickson showed a fungating partially upchucking large mass in the rectosigmoid colon, the mass begins about 10 cm from the dentate line and measured 5 cm in length. Biopsy was obtained. Multiple small and largemouth diverticula were found in the sigmoid colon and the ascending colon.   10/14/2016 Initial Biopsy   Rectosigmoid mass biopsy showed adenocarcinoma    10/15/2016 Imaging   CT of chest, abdomen and pelvis with contrast showed a 4 cm annular colonic soft tissue mass near the rectosigmoid junction, consistent with known primary carcinoma. Several small her chronic lymph nodes measuring up to 45m, suspicious for local lymph node metastasis. No evidence of distant metastasis.   11/17/2016 - 12/25/2016 Radiation Therapy    Radiation Therapy by Dr. MLisbeth Renshaw  11/17/2016 - 12/08/2016 Chemotherapy   Concurrent chemo Xeloda (150101min am and 100080mn evening) to go along with radiation.   Hold chemo due to platelet count 12/08/16   02/18/2017 Surgery   XI ROBOTIC ASSISTED LOWER ANTERIOR RESECTION WITH SPLENIC  FLEXURE MOBILIZATION and INTRAOPERATIVE ASSESSMENT OF VASCULAR PERFUSION by Dr. ThoMarcello Moores7/25/2018 Pathology Results   Diagnosis 02/18/17 1. Spleen - BENIGN SPLEEN (152 G) - NO MALIGNANCY IDENTIFIED 2. Colon, segmental resection for tumor, rectosigmoid - RESIDUAL ADENOCARCINOMA, MODERATELY DIFFERENTIATED - MARGINS UNINVOLVED BY CARCINOMA - NO CARCINOMA IDENTIFIED IN TEN LYMPH NODES (0/10) - SEE ONCOLOGY TABLE AND COMMENT BELOW 3. Rectum, resection, distal stump - BENIGN COLONIC MUCOSA - NO CARCINOMA IDENTIFIED IN ONE LYMPH NODE (0/1) 4. Colon, resection margin (donut), final distal - BENIGN COLONIC MUCOSA - NO CARCINOMA IDENTIFIED    03/23/2017 - 07/20/2017 Chemotherapy   Xeloda 1500m15m2h, 2 weeks on and 1 weeks off, started on 03/23/17 and comeplted 6 cycles on 07/20/17    08/05/2017 Imaging   CT CAP W Contrast  IMPRESSION: 1. Interval resection of rectal mass without evidence for metastatic disease in the chest, abdomen, or pelvis. 2. 6 mm nodular opacity in the posterior right costophrenic sulcus. 3. Stable hepatic cysts.      CURRENT THERAPY:  Surveillance  INTERVAL HISTORY:  Allison Ansteyhere for a follow up of of rectosigmoid cancer. Doing well, she had dirreha from sweets, resolved since she stopped eating sweets.  She noticed mild gassy sometimes, no abdominal pain, bloating, nausea, or other symptoms.  No hematochezia or melena.  Her weight has been stable, review of system otherwise negative.   MEDICAL HISTORY:  Past Medical History:  Diagnosis Date  . Arthritis   . Blood in stool 2018  . Cancer (H[facility])[place]/20/2018   rectal cancer-adenocarcinoma  . Clotting disorder (HCC)[place]. Temporary low platelet count (  [place])     SURGICAL HISTORY: Past Surgical History:  Procedure Laterality Date  . COLON SURGERY    . COLONOSCOPY     with propofol   . INTRAOPERATIVE CHOLANGIOGRAM  02/18/2017   Procedure: INTRAOPERATIVE ASSESSMENT OF VASCULAR PERFUSION;   Surgeon: Leighton Ruff, MD;  [facility];  Service: General;;  . LAPAROSCOPIC SPLENECTOMY  02/18/2017   Procedure: XI ROBOTIC ASSISTED SPLENECTOMY;  Surgeon: Michael Boston, MD;  [facility];  Service: General;;  . NO PAST SURGERIES    . XI ROBOTIC ASSISTED LOWER ANTERIOR RESECTION N/A 02/18/2017   Procedure: XI ROBOTIC ASSISTED LOWER ANTERIOR RESECTION WITH SPLENIC FLEXURE MOBILIZATION;  Surgeon: Leighton Ruff, MD;  [facility];  Service: General;  Laterality: N/A;    I have reviewed the social history and family history with the patient and they are unchanged from previous note.  ALLERGIES:  has No Known Allergies.  MEDICATIONS:  Current Outpatient Medications  Medication Sig Dispense Refill  . BLACK PEPPER-TURMERIC PO Take by mouth.    . cholecalciferol (VITAMIN D) 1000 units tablet Take 1 tablet by mouth daily.    Marland Kitchen ibuprofen (ADVIL,MOTRIN) 200 MG tablet Take 1-2 tablets (200-400 mg total) by mouth every 6 (six) hours as needed (for pain).    . Multiple Vitamins-Minerals (CENTRUM SILVER 50+WOMEN) TABS      No current facility-administered medications for this visit.     PHYSICAL EXAMINATION: ECOG PERFORMANCE STATUS: 0 - Asymptomatic  GENERAL:alert, no distress and comfortable SKIN: skin color, texture, turgor are normal, no rashes or significant lesions EYES: normal, Conjunctiva are pink and non-injected, sclera clear NECK: supple, thyroid normal size, non-tender, without nodularity LYMPH:  no palpable lymphadenopathy in the cervical, axillary  LUNGS: clear to auscultation and percussion with normal breathing effort HEART: regular rate & rhythm and no murmurs and no lower extremity edema ABDOMEN:abdomen soft, non-tender and normal bowel sounds Musculoskeletal:no cyanosis of digits and no clubbing  NEURO: alert & oriented x 3 with fluent speech, no focal motor/sensory deficits     LABORATORY DATA:  I have reviewed the data as listed CBC Latest Ref Rng & Units  06/13/2019 02/10/2019 07/15/2018  WBC 4.0 - 10.5 K/uL 7.3 8.6 9.2  Hemoglobin 12.0 - 15.0 g/dL 15.2(H) 14.0 14.0  Hematocrit 36.0 - 46.0 % 46.9(H) 42.2 43.8  Platelets 150 - 400 K/uL 380 380 407(H)     CMP Latest Ref Rng & Units 06/13/2019 02/10/2019 07/15/2018  Glucose 70 - 99 mg/dL 94 94 88  BUN 8 - 23 mg/dL 13 16 13   Creatinine 0.44 - 1.00 mg/dL 0.71 0.74 0.74  Sodium 135 - 145 mmol/L 140 142 143  Potassium 3.5 - 5.1 mmol/L 4.6 3.9 4.0  Chloride 98 - 111 mmol/L 104 106 105  CO2 22 - 32 mmol/L 27 26 27   Calcium 8.9 - 10.3 mg/dL 9.5 9.3 9.5  Total Protein 6.5 - 8.1 g/dL 7.8 7.4 7.7  Total Bilirubin 0.3 - 1.2 mg/dL 0.4 0.5 0.4  Alkaline Phos 38 - 126 U/L 96 88 106  AST 15 - 41 U/L 25 26 24   ALT 0 - 44 U/L 20 19 19       RADIOGRAPHIC STUDIES: I have personally reviewed the radiological images as listed and agreed with the findings in the report. No results found.   ASSESSMENT & PLAN:  Allison Dunlap is a 68 y.o. female with   1. Rectosigmoid adenocarcinoma, cT3N1M0, stage IIIB, ypT2N0M0, MMR normal -He was diagnosed in 09/2016.She underwent neoadjuvantconcurrent chemoradiation,  surgical resection and adjuvant Xeloda. -Surveillance 01/2018 colonoscopy, she had diverticulosis and 1 benign polyp removed, will repeat in 2022. -She is clinically doing very well. Physical exam including rectal exam unremarkable today. No clinical concern for recurrence. She is over 2 years since her diagnosis. Her risk of recurrence has decreased.  -Continue with surveillancewith f/u in 4 month with last surveillance CT scan  2. History ofITP  -She was found to have thrombocytopenia many years ago, per patient she underwent a bone marrow biopsy which was unremarkable. This is most consistent with ITP.  -She is status post splenectomy, had a great response, her thrombopeniaisresolved.  3. History of ulcerative colitis -Continue to follow up with Dr. Hilarie Fredrickson  -No current GI issues.    4. Cancer screening -Her 11/2017 Mammogram was negative. Continue yearly. She will postpone mammogram to summer 2020 due to COVID-19. -Continue age appropriate cancer screenings.   Plan -She is clinically doing well, continuesurveillance.  -Lab and f/u in58monthwith CT CP with contrast a few days before    No problem-specific Assessment & Plan notes found for this encounter.   Orders Placed This Encounter  Procedures  . CT Abdomen Pelvis W Contrast    Standing Status:   Future    Standing Expiration Date:   06/12/2020    Order Specific Question:   If indicated for the ordered procedure, I authorize the administration of contrast media per Radiology protocol    Answer:   Yes    Order Specific Question:   [facility]?    [facility]   Order Specific Question:   Is Oral Contrast requested for this exam?    Answer:   Yes, Per Radiology protocol    Order Specific Question:   Radiology Contrast Protocol - do NOT remove file path    Answer:   \\[email or web address]  . CT Chest W Contrast    Standing Status:   Future    Standing Expiration Date:   06/12/2020    Order Specific Question:   If indicated for the ordered procedure, I authorize the administration of contrast media per Radiology protocol    Answer:   Yes    Order Specific Question:   [facility]    [facility]   Order Specific Question:   Radiology Contrast Protocol - do NOT remove file path    Answer:   \\[email or web address]   I discussed the assessment and treatment plan with the patient. The patient was provided an opportunity to ask questions and all were answered. The patient agreed with the plan and demonstrated an understanding of the instructions.      YTruitt Merle MD 06/13/2019   I, AJoslyn Devon am acting as scribe for YTruitt Merle MD.   I have reviewed the above documentation for accuracy  and completeness, and I agree with the above.

## 2019-06-13 ENCOUNTER — Inpatient Hospital Stay: Payer: Medicare HMO

## 2019-06-13 ENCOUNTER — Telehealth: Payer: Self-pay | Admitting: Hematology

## 2019-06-13 ENCOUNTER — Encounter: Payer: Self-pay | Admitting: Hematology

## 2019-06-13 ENCOUNTER — Other Ambulatory Visit: Payer: Self-pay

## 2019-06-13 ENCOUNTER — Inpatient Hospital Stay: Payer: Medicare HMO | Attending: Hematology | Admitting: Hematology

## 2019-06-13 VITALS — BP 157/83 | HR 61 | Temp 98.2°F | Resp 16 | Ht 59.0 in | Wt 119.9 lb

## 2019-06-13 DIAGNOSIS — Z79899 Other long term (current) drug therapy: Secondary | ICD-10-CM | POA: Insufficient documentation

## 2019-06-13 DIAGNOSIS — Z85048 Personal history of other malignant neoplasm of rectum, rectosigmoid junction, and anus: Secondary | ICD-10-CM | POA: Insufficient documentation

## 2019-06-13 DIAGNOSIS — K7689 Other specified diseases of liver: Secondary | ICD-10-CM | POA: Insufficient documentation

## 2019-06-13 DIAGNOSIS — Z9081 Acquired absence of spleen: Secondary | ICD-10-CM | POA: Diagnosis not present

## 2019-06-13 DIAGNOSIS — M129 Arthropathy, unspecified: Secondary | ICD-10-CM | POA: Diagnosis not present

## 2019-06-13 DIAGNOSIS — C20 Malignant neoplasm of rectum: Secondary | ICD-10-CM | POA: Diagnosis not present

## 2019-06-13 DIAGNOSIS — Z9221 Personal history of antineoplastic chemotherapy: Secondary | ICD-10-CM | POA: Insufficient documentation

## 2019-06-13 DIAGNOSIS — Z923 Personal history of irradiation: Secondary | ICD-10-CM | POA: Diagnosis not present

## 2019-06-13 DIAGNOSIS — K5190 Ulcerative colitis, unspecified, without complications: Secondary | ICD-10-CM | POA: Insufficient documentation

## 2019-06-13 LAB — CMP (CANCER CENTER ONLY)
ALT: 20 U/L (ref 0–44)
AST: 25 U/L (ref 15–41)
Albumin: 4 g/dL (ref 3.5–5.0)
Alkaline Phosphatase: 96 U/L (ref 38–126)
Anion gap: 9 (ref 5–15)
BUN: 13 mg/dL (ref 8–23)
CO2: 27 mmol/L (ref 22–32)
Calcium: 9.5 mg/dL (ref 8.9–10.3)
Chloride: 104 mmol/L (ref 98–111)
Creatinine: 0.71 mg/dL (ref 0.44–1.00)
GFR, Est AFR Am: >60 mL/min (ref >60)
GFR, Estimated: >60 mL/min (ref >60)
Glucose, Bld: 94 mg/dL (ref 70–99)
Potassium: 4.6 mmol/L (ref 3.5–5.1)
Sodium: 140 mmol/L (ref 135–145)
Total Bilirubin: 0.4 mg/dL (ref 0.3–1.2)
Total Protein: 7.8 g/dL (ref 6.5–8.1)

## 2019-06-13 LAB — CBC WITH DIFFERENTIAL (CANCER CENTER ONLY)
Abs Immature Granulocytes: 0.01 10*3/uL (ref 0.00–0.07)
Basophils Absolute: 0.1 10*3/uL (ref 0.0–0.1)
Basophils Relative: 2 %
Eosinophils Absolute: 0.3 10*3/uL (ref 0.0–0.5)
Eosinophils Relative: 5 %
HCT: 46.9 % — ABNORMAL HIGH (ref 36.0–46.0)
Hemoglobin: 15.2 g/dL — ABNORMAL HIGH (ref 12.0–15.0)
Immature Granulocytes: 0 %
Lymphocytes Relative: 38 %
Lymphs Abs: 2.8 10*3/uL (ref 0.7–4.0)
MCH: 32.7 pg (ref 26.0–34.0)
MCHC: 32.4 g/dL (ref 30.0–36.0)
MCV: 100.9 fL — ABNORMAL HIGH (ref 80.0–100.0)
Monocytes Absolute: 0.7 10*3/uL (ref 0.1–1.0)
Monocytes Relative: 9 %
Neutro Abs: 3.4 10*3/uL (ref 1.7–7.7)
Neutrophils Relative %: 46 %
Platelet Count: 380 10*3/uL (ref 150–400)
RBC: 4.65 MIL/uL (ref 3.87–5.11)
RDW: 13.1 % (ref 11.5–15.5)
WBC Count: 7.3 10*3/uL (ref 4.0–10.5)
nRBC: 0 % (ref 0.0–0.2)

## 2019-06-13 LAB — CEA (IN HOUSE-CHCC): CEA (CHCC-In House): 4.5 ng/mL (ref 0.00–5.00)

## 2019-06-13 NOTE — Telephone Encounter (Signed)
Scheduled appt per 11/16 los.  Printed calendar and avs.

## 2019-06-14 ENCOUNTER — Encounter: Payer: Self-pay | Admitting: Hematology

## 2019-08-02 DIAGNOSIS — R69 Illness, unspecified: Secondary | ICD-10-CM | POA: Diagnosis not present

## 2019-10-04 NOTE — Progress Notes (Signed)
[place]   Telephone:(336) [phone] Fax:(336) [phone]   Clinic Follow up Note   Patient Care Team: Billie Ruddy, MD as PCP - General (Family Medicine) Pyrtle, Lajuan Lines, MD as Consulting Physician (Gastroenterology) Leighton Ruff, MD as Consulting Physician (General Surgery) Kyung Rudd, MD as Consulting Physician (Radiation Oncology) Truitt Merle, MD as Consulting Physician (Hematology) Michael Boston, MD as Consulting Physician (General Surgery)  Date of Service:  10/12/2019  CHIEF COMPLAINT: F/u of rectosigmoid cancer  SUMMARY OF ONCOLOGIC HISTORY: Oncology History Overview Note  Cancer Staging Rectal cancer [facility]) Staging form: Colon and Rectum, AJCC 8th Edition - Clinical stage from 10/14/2016: Stage IIIB (cT3, cN1, cM0) - Signed by Truitt Merle, MD on 10/24/2016     Rectal cancer ([place])  10/14/2016 Initial Diagnosis   Rectal cancer ([place])   10/14/2016 Procedure   Colonoscopy by Dr. Hilarie Fredrickson showed a fungating partially upchucking large mass in the rectosigmoid colon, the mass begins about 10 cm from the dentate line and measured 5 cm in length. Biopsy was obtained. Multiple small and largemouth diverticula were found in the sigmoid colon and the ascending colon.   10/14/2016 Initial Biopsy   Rectosigmoid mass biopsy showed adenocarcinoma    10/15/2016 Imaging   CT of chest, abdomen and pelvis with contrast showed a 4 cm annular colonic soft tissue mass near the rectosigmoid junction, consistent with known primary carcinoma. Several small her chronic lymph nodes measuring up to 41m, suspicious for local lymph node metastasis. No evidence of distant metastasis.   11/17/2016 - 12/25/2016 Radiation Therapy    Radiation Therapy by Dr. MLisbeth Renshaw  11/17/2016 - 12/08/2016 Chemotherapy   Concurrent chemo Xeloda (15027min am and 100055mn evening) to go along with radiation.   Hold chemo due to platelet count 12/08/16   02/18/2017 Surgery   XI ROBOTIC ASSISTED LOWER ANTERIOR  RESECTION WITH SPLENIC FLEXURE MOBILIZATION and INTRAOPERATIVE ASSESSMENT OF VASCULAR PERFUSION by Dr. ThoMarcello Moores7/25/2018 Pathology Results   Diagnosis 02/18/17 1. Spleen - BENIGN SPLEEN (152 G) - NO MALIGNANCY IDENTIFIED 2. Colon, segmental resection for tumor, rectosigmoid - RESIDUAL ADENOCARCINOMA, MODERATELY DIFFERENTIATED - MARGINS UNINVOLVED BY CARCINOMA - NO CARCINOMA IDENTIFIED IN TEN LYMPH NODES (0/10) - SEE ONCOLOGY TABLE AND COMMENT BELOW 3. Rectum, resection, distal stump - BENIGN COLONIC MUCOSA - NO CARCINOMA IDENTIFIED IN ONE LYMPH NODE (0/1) 4. Colon, resection margin (donut), final distal - BENIGN COLONIC MUCOSA - NO CARCINOMA IDENTIFIED    03/23/2017 - 07/20/2017 Chemotherapy   Xeloda 1500m80m2h, 2 weeks on and 1 weeks off, started on 03/23/17 and comeplted 6 cycles on 07/20/17    08/05/2017 Imaging   CT CAP W Contrast  IMPRESSION: 1. Interval resection of rectal mass without evidence for metastatic disease in the chest, abdomen, or pelvis. 2. 6 mm nodular opacity in the posterior right costophrenic sulcus. 3. Stable hepatic cysts.   10/07/2019 Imaging   CT CAP W Contrast IMPRESSION: 1. Surgical sutures at the rectosigmoid junction. No acute process or evidence of metastatic disease in the chest, abdomen, or pelvis. 2. Probable pancreas divisum. 3. Aortic Atherosclerosis (ICD10-I70.0). 4.  Emphysema (ICD10-J43.9).        CURRENT THERAPY:  Surveillance  INTERVAL HISTORY:  RobbAuda Finfrockhere for a follow up of rectosigmoid cancer. She presents to the clinic alone. She notes she is doing well. She denies stomach issues and has improved diarrhea. She denies incontinence. She notes she has had both her COVID19 vaccinations.  REVIEW OF SYSTEMS:   Constitutional: Denies fevers, chills or abnormal weight loss Eyes: Denies blurriness of vision Ears, nose, mouth, throat, and face: Denies mucositis or sore throat Respiratory: Denies cough,  dyspnea or wheezes Cardiovascular: Denies palpitation, chest discomfort or lower extremity swelling Gastrointestinal:  Denies nausea, heartburn (+) Improved diarrhea.  Skin: Denies abnormal skin rashes Lymphatics: Denies new lymphadenopathy or easy bruising Neurological:Denies numbness, tingling or new weaknesses Behavioral/Psych: Mood is stable, no new changes  All other systems were reviewed with the patient and are negative.  MEDICAL HISTORY:  Past Medical History:  Diagnosis Date  . Arthritis   . Blood in stool 2018  . Clotting disorder ([place])   . rectal ca 10/14/2016   rectal cancer-adenocarcinoma  . Temporary low platelet count ([place])     SURGICAL HISTORY: Past Surgical History:  Procedure Laterality Date  . COLON SURGERY    . COLONOSCOPY     with propofol   . INTRAOPERATIVE CHOLANGIOGRAM  02/18/2017   Procedure: INTRAOPERATIVE ASSESSMENT OF VASCULAR PERFUSION;  Surgeon: Leighton Ruff, MD;  Location: [facility];  Service: General;;  . LAPAROSCOPIC SPLENECTOMY  02/18/2017   Procedure: XI ROBOTIC ASSISTED SPLENECTOMY;  Surgeon: Michael Boston, MD;  Location: [facility];  Service: General;;  . NO PAST SURGERIES    . XI ROBOTIC ASSISTED LOWER ANTERIOR RESECTION N/A 02/18/2017   Procedure: XI ROBOTIC ASSISTED LOWER ANTERIOR RESECTION WITH SPLENIC FLEXURE MOBILIZATION;  Surgeon: Leighton Ruff, MD;  Location: [facility];  Service: General;  Laterality: N/A;    I have reviewed the social history and family history with the patient and they are unchanged from previous note.  ALLERGIES:  has No Known Allergies.  MEDICATIONS:  Current Outpatient Medications  Medication Sig Dispense Refill  . BLACK PEPPER-TURMERIC PO Take by mouth.    . cholecalciferol (VITAMIN D) 1000 units tablet Take 1 tablet by mouth daily.    Marland Kitchen ibuprofen (ADVIL,MOTRIN) 200 MG tablet Take 1-2 tablets (200-400 mg total) by mouth every 6 (six) hours as needed (for pain).    . Multiple Vitamins-Minerals (CENTRUM SILVER  50+WOMEN) TABS      No current facility-administered medications for this visit.    PHYSICAL EXAMINATION: ECOG PERFORMANCE STATUS: 0 - Asymptomatic  Vitals:   10/12/19 1334  BP: (!) 151/83  Pulse: 73  Resp: 17  Temp: 98.5 F (36.9 C)  SpO2: 100%   Filed Weights   10/12/19 1334  Weight: 119 lb 4.8 oz (54.1 kg)    Due to COVID19 we will limit examination to appearance. Patient had no complaints.  GENERAL:alert, no distress and comfortable SKIN: skin color normal, no rashes or significant lesions EYES: normal, Conjunctiva are pink and non-injected, sclera clear  NEURO: alert & oriented x 3 with fluent speech\  LABORATORY DATA:  I have reviewed the data as listed CBC Latest Ref Rng & Units 10/07/2019 06/13/2019 02/10/2019  WBC 4.0 - 10.5 K/uL 8.6 7.3 8.6  Hemoglobin 12.0 - 15.0 g/dL 14.4 15.2(H) 14.0  Hematocrit 36.0 - 46.0 % 44.3 46.9(H) 42.2  Platelets 150 - 400 K/uL 437(H) 380 380     CMP Latest Ref Rng & Units 10/07/2019 06/13/2019 02/10/2019  Glucose 70 - 99 mg/dL 96 94 94  BUN 8 - 23 mg/dL 10 13 16   Creatinine 0.44 - 1.00 mg/dL 0.69 0.71 0.74  Sodium 135 - 145 mmol/L 141 140 142  Potassium 3.5 - 5.1 mmol/L 4.0 4.6 3.9  Chloride 98 - 111 mmol/L 107 104 106  CO2 22 - 32  mmol/L 24 27 26   Calcium 8.9 - 10.3 mg/dL 9.2 9.5 9.3  Total Protein 6.5 - 8.1 g/dL 7.3 7.8 7.4  Total Bilirubin 0.3 - 1.2 mg/dL 0.5 0.4 0.5  Alkaline Phos 38 - 126 U/L 95 96 88  AST 15 - 41 U/L 23 25 26   ALT 0 - 44 U/L 13 20 19       RADIOGRAPHIC STUDIES: I have personally reviewed the radiological images as listed and agreed with the findings in the report. No results found.   ASSESSMENT & PLAN:  Deoni Cosey is a 69 y.o. female with    1. Rectosigmoid adenocarcinoma, cT3N1M0, stage IIIB, ypT2N0M0, MMR normal -He was diagnosed in 09/2016.She underwent neoadjuvantconcurrent chemoradiation, surgical resection and adjuvant Xeloda. -Surveillance 01/2018 colonoscopy, she had  diverticulosis and 1 benign polyp removed, will repeat in 2022 -I personally reviewed and discussed her CT CAP from 10/07/19 which shows no acute processes or evidence of metastatic disease.  -She is clinically doing well. Labs reviewed from last week, CEA, CBC and CMP WNL except Plt 437K. -She is 3 years since her diagnosis. I do not plan to repeat anymore surveillance scans unless she has concerning symptoms. Plan for next colonoscopy in summer 2022 -F/u in 6 months.    2. History ofITP  -She was found to have thrombocytopenia many years ago, per patient she underwent a bone marrow biopsy which was unremarkable. This is most consistent with ITP.  -She is status post splenectomy, had a great response, her thrombopeniapreviously resolved. Today plt 437K (10/12/19)   3. History of ulcerative colitis -Continue to follow up with Dr. Hilarie Fredrickson -No current GI issues.  4. Cancer screening -Her 11/2017 Mammogram was negative. Continue yearly. -Continue age appropriate cancer screenings.  5. Emphysema -Seen on 10/07/19 CT CAP, there was appearance of mild Emphysema, she is asymptomatic.  -She smoked for several years but quit 20 years ago. She notes her father had Emphysema  and her brother was diagnosed with Emphysema which could be hereditary.  -f/u clinically, she is asymptomatic    6. Elevated BP  -Has been elevated the past 2 visits.  -I encouraged her to monitor her BP at home and to find a PCP to be seen at least once yearly.    Plan -CT CAP reviewed, NED -She is clinically doing well, continuesurveillance.  -Lab and f/u in 6 months     No problem-specific Assessment & Plan notes found for this encounter.   No orders of the defined types were placed in this encounter.  All questions were answered. The patient knows to call the clinic with any problems, questions or concerns. No barriers to learning was detected.      Truitt Merle, MD 10/12/2019   I, Joslyn Devon, am  acting as scribe for Truitt Merle, MD.   I have reviewed the above documentation for accuracy and completeness, and I agree with the above.

## 2019-10-07 ENCOUNTER — Other Ambulatory Visit: Payer: Self-pay

## 2019-10-07 ENCOUNTER — Inpatient Hospital Stay: Payer: Medicare HMO | Attending: Hematology

## 2019-10-07 ENCOUNTER — Ambulatory Visit (HOSPITAL_COMMUNITY)
Admission: RE | Admit: 2019-10-07 | Discharge: 2019-10-07 | Disposition: A | Discharge status: Home / Self Care | Payer: Medicare HMO | Source: Ambulatory Visit | Attending: Hematology | Admitting: Hematology

## 2019-10-07 ENCOUNTER — Encounter (HOSPITAL_COMMUNITY): Payer: Self-pay

## 2019-10-07 DIAGNOSIS — Z85048 Personal history of other malignant neoplasm of rectum, rectosigmoid junction, and anus: Secondary | ICD-10-CM | POA: Insufficient documentation

## 2019-10-07 DIAGNOSIS — Z9081 Acquired absence of spleen: Secondary | ICD-10-CM | POA: Insufficient documentation

## 2019-10-07 DIAGNOSIS — K7689 Other specified diseases of liver: Secondary | ICD-10-CM | POA: Insufficient documentation

## 2019-10-07 DIAGNOSIS — R030 Elevated blood-pressure reading, without diagnosis of hypertension: Secondary | ICD-10-CM | POA: Insufficient documentation

## 2019-10-07 DIAGNOSIS — Z923 Personal history of irradiation: Secondary | ICD-10-CM | POA: Insufficient documentation

## 2019-10-07 DIAGNOSIS — K5190 Ulcerative colitis, unspecified, without complications: Secondary | ICD-10-CM | POA: Diagnosis not present

## 2019-10-07 DIAGNOSIS — I700 Atherosclerosis of aorta: Secondary | ICD-10-CM | POA: Insufficient documentation

## 2019-10-07 DIAGNOSIS — J439 Emphysema, unspecified: Secondary | ICD-10-CM | POA: Diagnosis not present

## 2019-10-07 DIAGNOSIS — D689 Coagulation defect, unspecified: Secondary | ICD-10-CM | POA: Insufficient documentation

## 2019-10-07 DIAGNOSIS — Z9221 Personal history of antineoplastic chemotherapy: Secondary | ICD-10-CM | POA: Insufficient documentation

## 2019-10-07 DIAGNOSIS — R918 Other nonspecific abnormal finding of lung field: Secondary | ICD-10-CM | POA: Diagnosis not present

## 2019-10-07 DIAGNOSIS — M129 Arthropathy, unspecified: Secondary | ICD-10-CM | POA: Diagnosis not present

## 2019-10-07 DIAGNOSIS — C20 Malignant neoplasm of rectum: Secondary | ICD-10-CM | POA: Insufficient documentation

## 2019-10-07 DIAGNOSIS — C218 Malignant neoplasm of overlapping sites of rectum, anus and anal canal: Secondary | ICD-10-CM | POA: Diagnosis not present

## 2019-10-07 LAB — CMP (CANCER CENTER ONLY)
ALT: 13 U/L (ref 0–44)
AST: 23 U/L (ref 15–41)
Albumin: 3.7 g/dL (ref 3.5–5.0)
Alkaline Phosphatase: 95 U/L (ref 38–126)
Anion gap: 10 (ref 5–15)
BUN: 10 mg/dL (ref 8–23)
CO2: 24 mmol/L (ref 22–32)
Calcium: 9.2 mg/dL (ref 8.9–10.3)
Chloride: 107 mmol/L (ref 98–111)
Creatinine: 0.69 mg/dL (ref 0.44–1.00)
GFR, Est AFR Am: >60 mL/min (ref >60)
GFR, Estimated: >60 mL/min (ref >60)
Glucose, Bld: 96 mg/dL (ref 70–99)
Potassium: 4 mmol/L (ref 3.5–5.1)
Sodium: 141 mmol/L (ref 135–145)
Total Bilirubin: 0.5 mg/dL (ref 0.3–1.2)
Total Protein: 7.3 g/dL (ref 6.5–8.1)

## 2019-10-07 LAB — CBC WITH DIFFERENTIAL (CANCER CENTER ONLY)
Abs Immature Granulocytes: 0.02 10*3/uL (ref 0.00–0.07)
Basophils Absolute: 0.1 10*3/uL (ref 0.0–0.1)
Basophils Relative: 2 %
Eosinophils Absolute: 0.4 10*3/uL (ref 0.0–0.5)
Eosinophils Relative: 4 %
HCT: 44.3 % (ref 36.0–46.0)
Hemoglobin: 14.4 g/dL (ref 12.0–15.0)
Immature Granulocytes: 0 %
Lymphocytes Relative: 28 %
Lymphs Abs: 2.5 10*3/uL (ref 0.7–4.0)
MCH: 32.1 pg (ref 26.0–34.0)
MCHC: 32.5 g/dL (ref 30.0–36.0)
MCV: 98.9 fL (ref 80.0–100.0)
Monocytes Absolute: 0.7 10*3/uL (ref 0.1–1.0)
Monocytes Relative: 8 %
Neutro Abs: 5 10*3/uL (ref 1.7–7.7)
Neutrophils Relative %: 58 %
Platelet Count: 437 10*3/uL — ABNORMAL HIGH (ref 150–400)
RBC: 4.48 MIL/uL (ref 3.87–5.11)
RDW: 12.9 % (ref 11.5–15.5)
WBC Count: 8.6 10*3/uL (ref 4.0–10.5)
nRBC: 0 % (ref 0.0–0.2)

## 2019-10-07 LAB — CEA (IN HOUSE-CHCC): CEA (CHCC-In House): <1 ng/mL (ref 0.00–5.00)

## 2019-10-07 MED ORDER — SODIUM CHLORIDE (PF) 0.9 % IJ SOLN
INTRAMUSCULAR | Status: AC
  Filled 2019-10-07: qty 50

## 2019-10-07 MED ORDER — IOHEXOL 300 MG/ML  SOLN
100.0000 mL | Freq: Once | INTRAMUSCULAR | Status: AC | PRN
  Administered 2019-10-07: 100 mL via INTRAVENOUS

## 2019-10-12 ENCOUNTER — Other Ambulatory Visit: Payer: Self-pay

## 2019-10-12 ENCOUNTER — Inpatient Hospital Stay (HOSPITAL_BASED_OUTPATIENT_CLINIC_OR_DEPARTMENT_OTHER): Payer: Medicare HMO | Admitting: Hematology

## 2019-10-12 ENCOUNTER — Telehealth: Payer: Self-pay | Admitting: Hematology

## 2019-10-12 ENCOUNTER — Other Ambulatory Visit: Payer: Medicare HMO

## 2019-10-12 ENCOUNTER — Encounter: Payer: Self-pay | Admitting: Hematology

## 2019-10-12 VITALS — BP 151/83 | HR 73 | Temp 98.5°F | Resp 17 | Ht 59.0 in | Wt 119.3 lb

## 2019-10-12 DIAGNOSIS — K5190 Ulcerative colitis, unspecified, without complications: Secondary | ICD-10-CM | POA: Diagnosis not present

## 2019-10-12 DIAGNOSIS — R918 Other nonspecific abnormal finding of lung field: Secondary | ICD-10-CM | POA: Diagnosis not present

## 2019-10-12 DIAGNOSIS — D689 Coagulation defect, unspecified: Secondary | ICD-10-CM | POA: Diagnosis not present

## 2019-10-12 DIAGNOSIS — R030 Elevated blood-pressure reading, without diagnosis of hypertension: Secondary | ICD-10-CM | POA: Diagnosis not present

## 2019-10-12 DIAGNOSIS — I700 Atherosclerosis of aorta: Secondary | ICD-10-CM | POA: Diagnosis not present

## 2019-10-12 DIAGNOSIS — Z923 Personal history of irradiation: Secondary | ICD-10-CM | POA: Diagnosis not present

## 2019-10-12 DIAGNOSIS — C20 Malignant neoplasm of rectum: Secondary | ICD-10-CM

## 2019-10-12 DIAGNOSIS — M129 Arthropathy, unspecified: Secondary | ICD-10-CM | POA: Diagnosis not present

## 2019-10-12 DIAGNOSIS — Z85048 Personal history of other malignant neoplasm of rectum, rectosigmoid junction, and anus: Secondary | ICD-10-CM | POA: Diagnosis not present

## 2019-10-12 DIAGNOSIS — J439 Emphysema, unspecified: Secondary | ICD-10-CM | POA: Diagnosis not present

## 2019-10-12 DIAGNOSIS — K7689 Other specified diseases of liver: Secondary | ICD-10-CM | POA: Diagnosis not present

## 2019-10-12 NOTE — Telephone Encounter (Signed)
Scheduled appt per 3/17 los.  Sent a message to [employer] to get a calendar mailed out.

## 2019-11-04 ENCOUNTER — Other Ambulatory Visit: Payer: Self-pay | Admitting: Family Medicine

## 2019-11-04 DIAGNOSIS — Z1231 Encounter for screening mammogram for malignant neoplasm of breast: Secondary | ICD-10-CM

## 2020-01-27 ENCOUNTER — Telehealth: Payer: Self-pay | Admitting: Hematology

## 2020-01-27 NOTE — Telephone Encounter (Signed)
R/s due to changes in template. Unable to reach pt left voicemail with appt time and date.

## 2020-02-06 DIAGNOSIS — R69 Illness, unspecified: Secondary | ICD-10-CM | POA: Diagnosis not present

## 2020-04-11 DIAGNOSIS — R69 Illness, unspecified: Secondary | ICD-10-CM | POA: Diagnosis not present

## 2020-04-11 NOTE — Progress Notes (Signed)
[place]   Telephone:(336) [phone] Fax:(336) [phone]   Clinic Follow up Note   Patient Care Team: Billie Ruddy, MD as PCP - General (Family Medicine) Pyrtle, Lajuan Lines, MD as Consulting Physician (Gastroenterology) Leighton Ruff, MD as Consulting Physician (General Surgery) Kyung Rudd, MD as Consulting Physician (Radiation Oncology) Truitt Merle, MD as Consulting Physician (Hematology) Michael Boston, MD as Consulting Physician (General Surgery)  Date of Service:  04/13/2020  CHIEF COMPLAINT: F/u of rectosigmoid cancer  SUMMARY OF ONCOLOGIC HISTORY: Oncology History Overview Note  Cancer Staging Rectal cancer [facility]) Staging form: Colon and Rectum, AJCC 8th Edition - Clinical stage from 10/14/2016: Stage IIIB (cT3, cN1, cM0) - Signed by Truitt Merle, MD on 10/24/2016     Rectal cancer ([place])  10/14/2016 Initial Diagnosis   Rectal cancer ([place])   10/14/2016 Procedure   Colonoscopy by Dr. Hilarie Fredrickson showed a fungating partially upchucking large mass in the rectosigmoid colon, the mass begins about 10 cm from the dentate line and measured 5 cm in length. Biopsy was obtained. Multiple small and largemouth diverticula were found in the sigmoid colon and the ascending colon.   10/14/2016 Initial Biopsy   Rectosigmoid mass biopsy showed adenocarcinoma    10/15/2016 Imaging   CT of chest, abdomen and pelvis with contrast showed a 4 cm annular colonic soft tissue mass near the rectosigmoid junction, consistent with known primary carcinoma. Several small her chronic lymph nodes measuring up to 33m, suspicious for local lymph node metastasis. No evidence of distant metastasis.   11/17/2016 - 12/25/2016 Radiation Therapy    Radiation Therapy by Dr. MLisbeth Renshaw  11/17/2016 - 12/08/2016 Chemotherapy   Concurrent chemo Xeloda (15032min am and 100041mn evening) to go along with radiation.   Hold chemo due to platelet count 12/08/16   02/18/2017 Surgery   XI ROBOTIC ASSISTED LOWER ANTERIOR  RESECTION WITH SPLENIC FLEXURE MOBILIZATION and INTRAOPERATIVE ASSESSMENT OF VASCULAR PERFUSION by Dr. ThoMarcello Moores7/25/2018 Pathology Results   Diagnosis 02/18/17 1. Spleen - BENIGN SPLEEN (152 G) - NO MALIGNANCY IDENTIFIED 2. Colon, segmental resection for tumor, rectosigmoid - RESIDUAL ADENOCARCINOMA, MODERATELY DIFFERENTIATED - MARGINS UNINVOLVED BY CARCINOMA - NO CARCINOMA IDENTIFIED IN TEN LYMPH NODES (0/10) - SEE ONCOLOGY TABLE AND COMMENT BELOW 3. Rectum, resection, distal stump - BENIGN COLONIC MUCOSA - NO CARCINOMA IDENTIFIED IN ONE LYMPH NODE (0/1) 4. Colon, resection margin (donut), final distal - BENIGN COLONIC MUCOSA - NO CARCINOMA IDENTIFIED    03/23/2017 - 07/20/2017 Chemotherapy   Xeloda 1500m74m2h, 2 weeks on and 1 weeks off, started on 03/23/17 and comeplted 6 cycles on 07/20/17    08/05/2017 Imaging   CT CAP W Contrast  IMPRESSION: 1. Interval resection of rectal mass without evidence for metastatic disease in the chest, abdomen, or pelvis. 2. 6 mm nodular opacity in the posterior right costophrenic sulcus. 3. Stable hepatic cysts.   10/07/2019 Imaging   CT CAP W Contrast IMPRESSION: 1. Surgical sutures at the rectosigmoid junction. No acute process or evidence of metastatic disease in the chest, abdomen, or pelvis. 2. Probable pancreas divisum. 3. Aortic Atherosclerosis (ICD10-I70.0). 4.  Emphysema (ICD10-J43.9).        CURRENT THERAPY:  Surveillance  INTERVAL HISTORY:  Allison Dunlap for a follow up of rectosigmoid cancer. She was last seen by me 6 months ago. She presents to the clinic alone. She notes she is doing well. She notes her BM are stable and frequent. She denies any breathing issues recently  from her emphysema. She has PCP but does not see them often. She notes she checks her BP at home (125/72) which will be normal but high in clinic.     REVIEW OF SYSTEMS:   Constitutional: Denies fevers, chills or abnormal weight  loss Eyes: Denies blurriness of vision Ears, nose, mouth, throat, and face: Denies mucositis or sore throat Respiratory: Denies cough, dyspnea or wheezes Cardiovascular: Denies palpitation, chest discomfort or lower extremity swelling Gastrointestinal:  Denies nausea, heartburn or change in bowel habits Skin: Denies abnormal skin rashes Lymphatics: Denies new lymphadenopathy or easy bruising Neurological:Denies numbness, tingling or new weaknesses Behavioral/Psych: Mood is stable, no new changes  All other systems were reviewed with the patient and are negative.  MEDICAL HISTORY:  Past Medical History:  Diagnosis Date   Arthritis    Blood in stool 2018   Clotting disorder ([place])    rectal ca 10/14/2016   rectal cancer-adenocarcinoma   Temporary low platelet count ([place])     SURGICAL HISTORY: Past Surgical History:  Procedure Laterality Date   COLON SURGERY     COLONOSCOPY     with propofol    INTRAOPERATIVE CHOLANGIOGRAM  02/18/2017   Procedure: INTRAOPERATIVE ASSESSMENT OF VASCULAR PERFUSION;  Surgeon: Leighton Ruff, MD;  Location: [facility];  Service: General;;   LAPAROSCOPIC SPLENECTOMY  02/18/2017   Procedure: XI ROBOTIC ASSISTED SPLENECTOMY;  Surgeon: Michael Boston, MD;  Location: [facility];  Service: General;;   NO PAST SURGERIES     XI ROBOTIC ASSISTED LOWER ANTERIOR RESECTION N/A 02/18/2017   Procedure: XI ROBOTIC ASSISTED LOWER ANTERIOR RESECTION WITH SPLENIC FLEXURE MOBILIZATION;  Surgeon: Leighton Ruff, MD;  Location: [facility];  Service: General;  Laterality: N/A;    I have reviewed the social history and family history with the patient and they are unchanged from previous note.  ALLERGIES:  has No Known Allergies.  MEDICATIONS:  Current Outpatient Medications  Medication Sig Dispense Refill   BLACK PEPPER-TURMERIC PO Take by mouth.     cholecalciferol (VITAMIN D) 1000 units tablet Take 1 tablet by mouth daily.     ibuprofen (ADVIL,MOTRIN) 200 MG tablet  Take 1-2 tablets (200-400 mg total) by mouth every 6 (six) hours as needed (for pain).     Multiple Vitamins-Minerals (CENTRUM SILVER 50+WOMEN) TABS      No current facility-administered medications for this visit.    PHYSICAL EXAMINATION: ECOG PERFORMANCE STATUS: 0 - Asymptomatic  Vitals:   04/13/20 0849  BP: (!) 139/97  Pulse: 93  Resp: 18  Temp: (!) 97.5 F (36.4 C)  SpO2: 100%   Filed Weights   04/13/20 0849  Weight: 114 lb 14.4 oz (52.1 kg)    GENERAL:alert, no distress and comfortable SKIN: skin color, texture, turgor are normal, or significant lesions (+) Very mild skin rash of her LE EYES: normal, Conjunctiva are pink and non-injected, sclera clear  NECK: supple, thyroid normal size, non-tender, without nodularity LYMPH:  no palpable lymphadenopathy in the cervical, axillary  LUNGS: clear to auscultation and percussion with normal breathing effort HEART: regular rate & rhythm and no murmurs and no lower extremity edema ABDOMEN:abdomen soft, non-tender and normal bowel sounds (+) Surgical incisions healed well.  Musculoskeletal:no cyanosis of digits and no clubbing  NEURO: alert & oriented x 3 with fluent speech, no focal motor/sensory deficits  LABORATORY DATA:  I have reviewed the data as listed CBC Latest Ref Rng & Units 04/13/2020 10/07/2019 06/13/2019  WBC 4.0 - 10.5 K/uL 8.6 8.6 7.3  Hemoglobin 12.0 -  15.0 g/dL 14.2 14.4 15.2(H)  Hematocrit 36 - 46 % 44.0 44.3 46.9(H)  Platelets 150 - 400 K/uL 385 437(H) 380     CMP Latest Ref Rng & Units 04/13/2020 10/07/2019 06/13/2019  Glucose 70 - 99 mg/dL 97 96 94  BUN 8 - 23 mg/dL _0 Creatinine 0.44 - 1.00 mg/dL 0.68 0.69 0.71  Sodium 135 - 145 mmol/L 138 141 140  Potassium 3.5 - 5.1 mmol/L 4.2 4.0 4.6  Chloride 98 - 111 mmol/L 107 107 104  CO2 22 - 32 mmol/L _1 Calcium 8.9 - 10.3 mg/dL 9.5 9.2 9.5  Total Protein 6.5 - 8.1 g/dL 7.5 7.3 7.8  Total Bilirubin 0.3 - 1.2 mg/dL 0.4 0.5 0.4  Alkaline Phos  38 - 126 U/L 107 95 96  AST 15 - 41 U/L _2 ALT 0 - 44 U/L _3 RADIOGRAPHIC STUDIES: I have personally reviewed the radiological images as listed and agreed with the findings in the report. No results found.   ASSESSMENT & PLAN:  Yisell Sprunger is a 69 y.o. female with    1. Rectosigmoid adenocarcinoma, cT3N1M0, stage IIIB, ypT2N0M0, MMR normal -He was diagnosed in 09/2016.She underwent neoadjuvantconcurrent chemoradiation, surgical resection and adjuvant Xeloda. -Surveillance 01/2018 colonoscopy, she had diverticulosis and 1 benign polyp removed, will repeat in summer 2022 -She is clinically doing very well and stable. Labs reviewed, CBC and CMP WNL. CEA still pending. Physical exam unremarkable.   -She is over 3 years since her cancer diagnosis and her risk of recurrence has significantly decreased. Continue 5 year surveillance. Her 09/2019 CT Scan was NED. I do not plan for more surveillance scans unless she has concerning symptoms of recurrence.  -F/u in 9 months with lab    2.History ofITP, resolved   -She was found to have thrombocytopenia many years ago, per patient she underwent a bone marrow biopsy which was unremarkable. This is most consistent with ITP.  -She is status post splenectomy, had a great response, her thrombopeniapreviously resolved.   3. History of ulcerative colitis -Continue to follow up with Dr. Hilarie Fredrickson. She has no current GI issues.  4. Cancer screening -Her last Mammogram in 11/2017 was negative. I strongly encouraged her to continue yearly, she is overdue. I will order mammogram today (04/13/20)  -Continue age appropriate cancer screenings.  5. Emphysema -Seen on 10/07/19 CT CAP, there was appearance of mild Emphysema, she is asymptomatic.  -She smoked for several years but quit 20 years ago. She notes her father had Emphysema  and her brother was diagnosed with Emphysema which could be hereditary.  -f/u clinically, she  is asymptomatic. Stable.   6. Elevated BP  -Has been elevated in clinic in the past year. BP at 139/97 today (04/13/20).  -Patient notes normal BP at home. I encouraged her to continue to monitor at home and f/u with PCP.    Plan -screening mammogram in 1 month  -She is clinically doing well, continuesurveillance.  -Lab and f/u in 9 months with NP Lacie    No problem-specific Assessment & Plan notes found for this encounter.   Orders Placed This Encounter  Procedures   MM Digital Screening    Standing Status:   Future    Standing Expiration Date:   04/13/2021    Order Specific Question:   Reason for Exam (SYMPTOM  OR DIAGNOSIS REQUIRED)    Answer:   screening  Order Specific Question:   Preferred imaging location?    Answer:   [facility]   All questions were answered. The patient knows to call the clinic with any problems, questions or concerns. No barriers to learning was detected. The total time spent in the appointment was 25 minutes.     Truitt Merle, MD 04/13/2020   I, Joslyn Devon, am acting as scribe for Truitt Merle, MD.   I have reviewed the above documentation for accuracy and completeness, and I agree with the above.

## 2020-04-13 ENCOUNTER — Telehealth: Payer: Self-pay | Admitting: Hematology

## 2020-04-13 ENCOUNTER — Inpatient Hospital Stay: Payer: Medicare HMO | Admitting: Hematology

## 2020-04-13 ENCOUNTER — Other Ambulatory Visit: Payer: Self-pay

## 2020-04-13 ENCOUNTER — Encounter: Payer: Self-pay | Admitting: Hematology

## 2020-04-13 ENCOUNTER — Ambulatory Visit: Payer: Medicare HMO | Admitting: Hematology

## 2020-04-13 ENCOUNTER — Other Ambulatory Visit: Payer: Medicare HMO

## 2020-04-13 ENCOUNTER — Inpatient Hospital Stay: Payer: Medicare HMO | Attending: Hematology

## 2020-04-13 VITALS — BP 139/97 | HR 93 | Temp 97.5°F | Resp 18 | Ht 59.0 in | Wt 114.9 lb

## 2020-04-13 DIAGNOSIS — Z85048 Personal history of other malignant neoplasm of rectum, rectosigmoid junction, and anus: Secondary | ICD-10-CM | POA: Insufficient documentation

## 2020-04-13 DIAGNOSIS — R030 Elevated blood-pressure reading, without diagnosis of hypertension: Secondary | ICD-10-CM | POA: Diagnosis not present

## 2020-04-13 DIAGNOSIS — J439 Emphysema, unspecified: Secondary | ICD-10-CM | POA: Insufficient documentation

## 2020-04-13 DIAGNOSIS — Z9221 Personal history of antineoplastic chemotherapy: Secondary | ICD-10-CM | POA: Insufficient documentation

## 2020-04-13 DIAGNOSIS — Z1231 Encounter for screening mammogram for malignant neoplasm of breast: Secondary | ICD-10-CM

## 2020-04-13 DIAGNOSIS — K5730 Diverticulosis of large intestine without perforation or abscess without bleeding: Secondary | ICD-10-CM | POA: Insufficient documentation

## 2020-04-13 DIAGNOSIS — Z923 Personal history of irradiation: Secondary | ICD-10-CM | POA: Insufficient documentation

## 2020-04-13 DIAGNOSIS — K5190 Ulcerative colitis, unspecified, without complications: Secondary | ICD-10-CM | POA: Insufficient documentation

## 2020-04-13 DIAGNOSIS — K7689 Other specified diseases of liver: Secondary | ICD-10-CM | POA: Insufficient documentation

## 2020-04-13 DIAGNOSIS — M1990 Unspecified osteoarthritis, unspecified site: Secondary | ICD-10-CM | POA: Diagnosis not present

## 2020-04-13 DIAGNOSIS — C20 Malignant neoplasm of rectum: Secondary | ICD-10-CM

## 2020-04-13 DIAGNOSIS — I700 Atherosclerosis of aorta: Secondary | ICD-10-CM | POA: Diagnosis not present

## 2020-04-13 LAB — CMP (CANCER CENTER ONLY)
ALT: 15 U/L (ref 0–44)
AST: 23 U/L (ref 15–41)
Albumin: 3.6 g/dL (ref 3.5–5.0)
Alkaline Phosphatase: 107 U/L (ref 38–126)
Anion gap: 7 (ref 5–15)
BUN: 16 mg/dL (ref 8–23)
CO2: 24 mmol/L (ref 22–32)
Calcium: 9.5 mg/dL (ref 8.9–10.3)
Chloride: 107 mmol/L (ref 98–111)
Creatinine: 0.68 mg/dL (ref 0.44–1.00)
GFR, Est AFR Am: >60 mL/min (ref >60)
GFR, Estimated: >60 mL/min (ref >60)
Glucose, Bld: 97 mg/dL (ref 70–99)
Potassium: 4.2 mmol/L (ref 3.5–5.1)
Sodium: 138 mmol/L (ref 135–145)
Total Bilirubin: 0.4 mg/dL (ref 0.3–1.2)
Total Protein: 7.5 g/dL (ref 6.5–8.1)

## 2020-04-13 LAB — CBC WITH DIFFERENTIAL (CANCER CENTER ONLY)
Abs Immature Granulocytes: 0.02 10*3/uL (ref 0.00–0.07)
Basophils Absolute: 0.2 10*3/uL — ABNORMAL HIGH (ref 0.0–0.1)
Basophils Relative: 2 %
Eosinophils Absolute: 0.5 10*3/uL (ref 0.0–0.5)
Eosinophils Relative: 6 %
HCT: 44 % (ref 36.0–46.0)
Hemoglobin: 14.2 g/dL (ref 12.0–15.0)
Immature Granulocytes: 0 %
Lymphocytes Relative: 33 %
Lymphs Abs: 2.8 10*3/uL (ref 0.7–4.0)
MCH: 32 pg (ref 26.0–34.0)
MCHC: 32.3 g/dL (ref 30.0–36.0)
MCV: 99.1 fL (ref 80.0–100.0)
Monocytes Absolute: 0.8 10*3/uL (ref 0.1–1.0)
Monocytes Relative: 10 %
Neutro Abs: 4.2 10*3/uL (ref 1.7–7.7)
Neutrophils Relative %: 49 %
Platelet Count: 385 10*3/uL (ref 150–400)
RBC: 4.44 MIL/uL (ref 3.87–5.11)
RDW: 13 % (ref 11.5–15.5)
WBC Count: 8.6 10*3/uL (ref 4.0–10.5)
nRBC: 0 % (ref 0.0–0.2)

## 2020-04-13 LAB — CEA (IN HOUSE-CHCC): CEA (CHCC-In House): 4.09 ng/mL (ref 0.00–5.00)

## 2020-04-13 NOTE — Telephone Encounter (Signed)
Scheduled per los. Declined printout

## 2020-05-11 ENCOUNTER — Ambulatory Visit
Admission: RE | Admit: 2020-05-11 | Discharge: 2020-05-11 | Disposition: A | Discharge status: Home / Self Care | Payer: Medicare HMO | Source: Ambulatory Visit | Attending: Hematology | Admitting: Hematology

## 2020-05-11 ENCOUNTER — Other Ambulatory Visit: Payer: Self-pay

## 2020-05-11 DIAGNOSIS — Z1231 Encounter for screening mammogram for malignant neoplasm of breast: Secondary | ICD-10-CM

## 2020-08-20 ENCOUNTER — Telehealth: Payer: Self-pay | Admitting: Family Medicine

## 2020-08-20 NOTE — Telephone Encounter (Signed)
Left message for patient to call back and schedule Medicare Annual Wellness Visit (AWV) either virtually or in office.   Last AWV no information  please schedule at anytime with [facility] Nurse Health Advisor 1 or 2   This should be a 45 minute visit. Patient needs appointment with PCP last appointment 01/11/18

## 2020-09-27 ENCOUNTER — Telehealth: Payer: Self-pay | Admitting: Nurse Practitioner

## 2020-09-27 NOTE — Telephone Encounter (Signed)
Left message with moved upcoming appointment due to provider's template. Gave option to call back to reschedule if needed.

## 2020-10-15 ENCOUNTER — Telehealth: Payer: Self-pay | Admitting: Family Medicine

## 2020-10-15 NOTE — Telephone Encounter (Signed)
Left message for patient to call back and schedule Medicare Annual Wellness Visit (AWV) either virtually or in office. No detailed message   AWVI  please schedule at anytime with [facility] Nurse Health Advisor 1 or 2   This should be a 45 minute visit. Also needs appointment with PCP  Last appointment 01/11/18'

## 2020-11-06 DIAGNOSIS — H52223 Regular astigmatism, bilateral: Secondary | ICD-10-CM | POA: Diagnosis not present

## 2020-11-06 DIAGNOSIS — H524 Presbyopia: Secondary | ICD-10-CM | POA: Diagnosis not present

## 2021-01-03 ENCOUNTER — Encounter: Payer: Self-pay | Admitting: Hematology

## 2021-01-09 ENCOUNTER — Other Ambulatory Visit: Payer: Self-pay

## 2021-01-09 DIAGNOSIS — C20 Malignant neoplasm of rectum: Secondary | ICD-10-CM

## 2021-01-09 NOTE — Progress Notes (Signed)
[place]   [phone] [phone]   Clinic Follow up Note   Patient Care Team: Allison Ruddy, MD as PCP - General (Family Medicine) Allison Dunlap, Allison Lines, MD as Consulting Physician (Gastroenterology) Allison Ruff, MD as Consulting Physician (General Surgery) Allison Rudd, MD as Consulting Physician (Radiation Oncology) Allison Merle, MD as Consulting Physician (Hematology) Allison Boston, MD as Consulting Physician (General Surgery) 01/10/2021  CHIEF COMPLAINT: F/up rectosigmoid cancer   SUMMARY OF ONCOLOGIC HISTORY: Oncology History Overview Note  Cancer Staging Rectal cancer [facility]) Staging form: Colon and Rectum, AJCC 8th Edition - Clinical stage from 10/14/2016: Stage IIIB (cT3, cN1, cM0) - Signed by Allison Merle, MD on 10/24/2016      Rectal cancer ([place])  10/14/2016 Initial Diagnosis   Rectal cancer ([place])    10/14/2016 Procedure   Colonoscopy by Dr. Hilarie Dunlap showed a fungating partially upchucking large mass in the rectosigmoid colon, the mass begins about 10 cm from the dentate line and measured 5 cm in length. Biopsy was obtained. Multiple small and largemouth diverticula were found in the sigmoid colon and the ascending colon.    10/14/2016 Initial Biopsy   Rectosigmoid mass biopsy showed adenocarcinoma     10/15/2016 Imaging   CT of chest, abdomen and pelvis with contrast showed a 4 cm annular colonic soft tissue mass near the rectosigmoid junction, consistent with known primary carcinoma. Several small her chronic lymph nodes measuring up to 23m, suspicious for local lymph node metastasis. No evidence of distant metastasis.    11/17/2016 - 12/25/2016 Radiation Therapy    Radiation Therapy by Dr. MLisbeth Dunlap   11/17/2016 - 12/08/2016 Chemotherapy   Concurrent chemo Xeloda (15019min am and 100039mn evening) to go along with radiation.   Hold chemo due to platelet count 12/08/16   02/18/2017 Surgery   XI ROBOTIC ASSISTED LOWER ANTERIOR RESECTION  WITH SPLENIC FLEXURE MOBILIZATION and INTRAOPERATIVE ASSESSMENT OF VASCULAR PERFUSION by Dr. ThoMarcello Dunlap 02/18/2017 Pathology Results   Diagnosis 02/18/17 1. Spleen - BENIGN SPLEEN (152 G) - NO MALIGNANCY IDENTIFIED 2. Colon, segmental resection for tumor, rectosigmoid - RESIDUAL ADENOCARCINOMA, MODERATELY DIFFERENTIATED - MARGINS UNINVOLVED BY CARCINOMA - NO CARCINOMA IDENTIFIED IN TEN LYMPH NODES (0/10) - SEE ONCOLOGY TABLE AND COMMENT BELOW 3. Rectum, resection, distal stump - BENIGN COLONIC MUCOSA - NO CARCINOMA IDENTIFIED IN ONE LYMPH NODE (0/1) 4. Colon, resection margin (donut), final distal - BENIGN COLONIC MUCOSA - NO CARCINOMA IDENTIFIED     03/23/2017 - 07/20/2017 Chemotherapy   Xeloda 1500m24m2h, 2 weeks on and 1 weeks off, started on 03/23/17 and comeplted 6 cycles on 07/20/17    08/05/2017 Imaging   CT CAP W Contrast  IMPRESSION: 1. Interval resection of rectal mass without evidence for metastatic disease in the chest, abdomen, or pelvis. 2. 6 mm nodular opacity in the posterior right costophrenic sulcus. 3. Stable hepatic cysts.    10/07/2019 Imaging   CT CAP W Contrast IMPRESSION: 1. Surgical sutures at the rectosigmoid junction. No acute process or evidence of metastatic disease in the chest, abdomen, or pelvis. 2. Probable pancreas divisum. 3. Aortic Atherosclerosis (ICD10-I70.0). 4.  Emphysema (ICD10-J43.9).       CURRENT THERAPY: Surveillance   INTERVAL HISTORY: [name] for follow up as scheduled. She was last seen for surveillance visit 04/13/20.  She is doing well, denies changes in her health in the interim.  Energy and appetite are normal, denies unintentional weight loss.  Denies bowel changes, she has a  small caliber BM daily, occasionally loose with certain foods.  Denies rectal pain or bleeding, abdominal pain or bloating, nausea/vomiting, recent fever or chills, cough, chest pain, dyspnea, or other new concerns.   MEDICAL HISTORY:   Past Medical History:  Diagnosis Date   Arthritis    Blood in stool 2018   Clotting disorder ([place])    rectal ca 10/14/2016   rectal cancer-adenocarcinoma   Temporary low platelet count ([place])     SURGICAL HISTORY: Past Surgical History:  Procedure Laterality Date   COLON SURGERY     COLONOSCOPY     with propofol    INTRAOPERATIVE CHOLANGIOGRAM  02/18/2017   Procedure: INTRAOPERATIVE ASSESSMENT OF VASCULAR PERFUSION;  Surgeon: Allison Ruff, MD;  Location: [facility];  Service: General;;   LAPAROSCOPIC SPLENECTOMY  02/18/2017   Procedure: XI ROBOTIC ASSISTED SPLENECTOMY;  Surgeon: Allison Boston, MD;  Location: [facility];  Service: General;;   NO PAST SURGERIES     XI ROBOTIC ASSISTED LOWER ANTERIOR RESECTION N/A 02/18/2017   Procedure: XI ROBOTIC ASSISTED LOWER ANTERIOR RESECTION WITH SPLENIC FLEXURE MOBILIZATION;  Surgeon: Allison Ruff, MD;  Location: [facility];  Service: General;  Laterality: N/A;    I have reviewed the social history and family history with the patient and they are unchanged from previous note.  ALLERGIES:  has No Known Allergies.  MEDICATIONS:  Current Outpatient Medications  Medication Sig Dispense Refill   BLACK PEPPER-TURMERIC PO Take by mouth.     cholecalciferol (VITAMIN D) 1000 units tablet Take 1 tablet by mouth daily.     ibuprofen (ADVIL,MOTRIN) 200 MG tablet Take 1-2 tablets (200-400 mg total) by mouth every 6 (six) hours as needed (for pain).     Multiple Vitamins-Minerals (CENTRUM SILVER 50+WOMEN) TABS      No current facility-administered medications for this visit.    PHYSICAL EXAMINATION: ECOG PERFORMANCE STATUS: 0 - Asymptomatic  Vitals:   01/10/21 1027  BP: 139/76  Pulse: 66  Resp: 18  Temp: 98 F (36.7 C)   Filed Weights   01/10/21 1027  Weight: 112 lb 4.8 oz (50.9 kg)    GENERAL:alert, no distress and comfortable SKIN: Raised erythematous rash to anterior right lower leg EYES: sclera clear NECK: Without mass LYMPH:  no palpable  cervical, supraclavicular, or inguinal lymphadenopathy  LUNGS: clear, normal breathing effort HEART: regular rate & rhythm, no lower extremity edema ABDOMEN:abdomen soft, non-tender and normal bowel sounds NEURO: alert & oriented x 3 with fluent speech, no focal motor/sensory deficits  LABORATORY DATA:  I have reviewed the data as listed CBC Latest Ref Rng & Units 01/10/2021 04/13/2020 10/07/2019  WBC 4.0 - 10.5 K/uL 7.9 8.6 8.6  Hemoglobin 12.0 - 15.0 g/dL 14.1 14.2 14.4  Hematocrit 36.0 - 46.0 % 42.3 44.0 44.3  Platelets 150 - 400 K/uL 398 385 437(H)     CMP Latest Ref Rng & Units 01/10/2021 04/13/2020 10/07/2019  Glucose 70 - 99 mg/dL 93 97 96  BUN 8 - 23 mg/dL 10 16 10   Creatinine 0.44 - 1.00 mg/dL 0.70 0.68 0.69  Sodium 135 - 145 mmol/L 140 138 141  Potassium 3.5 - 5.1 mmol/L 4.3 4.2 4.0  Chloride 98 - 111 mmol/L 107 107 107  CO2 22 - 32 mmol/L 24 24 24   Calcium 8.9 - 10.3 mg/dL 9.6 9.5 9.2  Total Protein 6.5 - 8.1 g/dL 7.6 7.5 7.3  Total Bilirubin 0.3 - 1.2 mg/dL 0.5 0.4 0.5  Alkaline Phos 38 - 126 U/L 94 107 95  AST 15 - 41 U/L 19 23 23   ALT 0 - 44 U/L 11 15 13       RADIOGRAPHIC STUDIES: I have personally reviewed the radiological images as listed and agreed with the findings in the report. No results found.   ASSESSMENT & PLAN: 70 year old female    1. Rectosigmoid adenocarcinoma, cT3N1M0, stage IIIB, ypT2N0M0, MMR normal  -Diagnosed in 09/2016, s/p neoadjuvant concurrent chemoradiation, surgical resection, and adjuvant Xeloda -Surveillance colonoscopy 01/2018 showed diverticulosis and 1 benign polyp removed, repeat 01/2021 with Dr. Hilarie Dunlap -Last surveillance CT 09/2019 was NED  2. 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 History of ulcerative colitis   Disposition: Allison Dunlap is clinically doing well.  Exam is benign, CBC and CMP are normal.  We will follow-up on the pending CEA from today (normal at diagnosis).  There is no  clinical concern for colorectal cancer recurrence, or new CRC.  She is 4.5 years from diagnosis, the recurrence risk has significantly decreased.  Continue surveillance, next colonoscopy due 01/2021 with Dr. Hilarie Dunlap, I will CC my note.    I encouraged her to continue other age-appropriate cancer screening such as mammogram in 04/2021, engage in physical exercise, avoid smoking, limit alcohol, and routine follow-up with PCP.  She is up-to-date on all COVID vaccines and boosters.  She will return for routine surveillance visit in 09/2021 which will be 5 years from initial diagnosis.  We reviewed concerning signs and symptoms of recurrence, she knows to call sooner if needed.  All questions were answered. The patient knows to call the clinic with any problems, questions or concerns. No barriers to learning were detected.     Alla Feeling, NP 01/10/21

## 2021-01-10 ENCOUNTER — Encounter: Payer: Self-pay | Admitting: Nurse Practitioner

## 2021-01-10 ENCOUNTER — Other Ambulatory Visit: Payer: Self-pay

## 2021-01-10 ENCOUNTER — Inpatient Hospital Stay: Payer: Medicare HMO

## 2021-01-10 ENCOUNTER — Inpatient Hospital Stay: Payer: Medicare HMO | Attending: Nurse Practitioner | Admitting: Nurse Practitioner

## 2021-01-10 VITALS — BP 139/76 | HR 66 | Temp 98.0°F | Resp 18 | Ht 59.0 in | Wt 112.3 lb

## 2021-01-10 DIAGNOSIS — K7689 Other specified diseases of liver: Secondary | ICD-10-CM | POA: Diagnosis not present

## 2021-01-10 DIAGNOSIS — D693 Immune thrombocytopenic purpura: Secondary | ICD-10-CM | POA: Insufficient documentation

## 2021-01-10 DIAGNOSIS — K5190 Ulcerative colitis, unspecified, without complications: Secondary | ICD-10-CM | POA: Diagnosis not present

## 2021-01-10 DIAGNOSIS — C20 Malignant neoplasm of rectum: Secondary | ICD-10-CM | POA: Diagnosis not present

## 2021-01-10 DIAGNOSIS — J439 Emphysema, unspecified: Secondary | ICD-10-CM | POA: Diagnosis not present

## 2021-01-10 DIAGNOSIS — Z9221 Personal history of antineoplastic chemotherapy: Secondary | ICD-10-CM | POA: Diagnosis not present

## 2021-01-10 DIAGNOSIS — I700 Atherosclerosis of aorta: Secondary | ICD-10-CM | POA: Diagnosis not present

## 2021-01-10 DIAGNOSIS — Z923 Personal history of irradiation: Secondary | ICD-10-CM | POA: Diagnosis not present

## 2021-01-10 DIAGNOSIS — K5730 Diverticulosis of large intestine without perforation or abscess without bleeding: Secondary | ICD-10-CM | POA: Insufficient documentation

## 2021-01-10 DIAGNOSIS — Z85048 Personal history of other malignant neoplasm of rectum, rectosigmoid junction, and anus: Secondary | ICD-10-CM | POA: Diagnosis not present

## 2021-01-10 LAB — CMP (CANCER CENTER ONLY)
ALT: 11 U/L (ref 0–44)
AST: 19 U/L (ref 15–41)
Albumin: 3.7 g/dL (ref 3.5–5.0)
Alkaline Phosphatase: 94 U/L (ref 38–126)
Anion gap: 9 (ref 5–15)
BUN: 10 mg/dL (ref 8–23)
CO2: 24 mmol/L (ref 22–32)
Calcium: 9.6 mg/dL (ref 8.9–10.3)
Chloride: 107 mmol/L (ref 98–111)
Creatinine: 0.7 mg/dL (ref 0.44–1.00)
GFR, Estimated: >60 mL/min
Glucose, Bld: 93 mg/dL (ref 70–99)
Potassium: 4.3 mmol/L (ref 3.5–5.1)
Sodium: 140 mmol/L (ref 135–145)
Total Bilirubin: 0.5 mg/dL (ref 0.3–1.2)
Total Protein: 7.6 g/dL (ref 6.5–8.1)

## 2021-01-10 LAB — CBC WITH DIFFERENTIAL (CANCER CENTER ONLY)
Abs Immature Granulocytes: 0.02 K/uL (ref 0.00–0.07)
Basophils Absolute: 0.1 K/uL (ref 0.0–0.1)
Basophils Relative: 1 %
Eosinophils Absolute: 0.3 K/uL (ref 0.0–0.5)
Eosinophils Relative: 4 %
HCT: 42.3 % (ref 36.0–46.0)
Hemoglobin: 14.1 g/dL (ref 12.0–15.0)
Immature Granulocytes: 0 %
Lymphocytes Relative: 34 %
Lymphs Abs: 2.7 K/uL (ref 0.7–4.0)
MCH: 32.3 pg (ref 26.0–34.0)
MCHC: 33.3 g/dL (ref 30.0–36.0)
MCV: 97 fL (ref 80.0–100.0)
Monocytes Absolute: 0.7 K/uL (ref 0.1–1.0)
Monocytes Relative: 9 %
Neutro Abs: 4.1 K/uL (ref 1.7–7.7)
Neutrophils Relative %: 52 %
Platelet Count: 398 K/uL (ref 150–400)
RBC: 4.36 MIL/uL (ref 3.87–5.11)
RDW: 12.8 % (ref 11.5–15.5)
WBC Count: 7.9 K/uL (ref 4.0–10.5)
nRBC: 0 % (ref 0.0–0.2)

## 2021-01-10 LAB — CEA (IN HOUSE-CHCC): CEA (CHCC-In House): 4.84 ng/mL (ref 0.00–5.00)

## 2021-01-11 ENCOUNTER — Telehealth: Payer: Self-pay | Admitting: Hematology

## 2021-01-11 NOTE — Telephone Encounter (Signed)
Left message with follow-up appointment per 6/16 los.

## 2021-01-15 ENCOUNTER — Ambulatory Visit (INDEPENDENT_AMBULATORY_CARE_PROVIDER_SITE_OTHER): Payer: Medicare HMO

## 2021-01-15 ENCOUNTER — Other Ambulatory Visit: Payer: Self-pay

## 2021-01-15 VITALS — BP 120/64 | HR 61 | Temp 98.3°F | Wt 113.5 lb

## 2021-01-15 DIAGNOSIS — Z0000 Encounter for general adult medical examination without abnormal findings: Secondary | ICD-10-CM

## 2021-01-15 NOTE — Progress Notes (Signed)
Subjective:   Allison Dunlap is a 70 y.o. female who presents for an Initial Medicare Annual Wellness Visit.  Review of Systems     Cardiac Risk Factors include: advanced age (>9mn, >>64women)     Objective:    Today's Vitals   01/15/21 1305  BP: 120/64  Pulse: 61  Temp: 98.3 F (36.8 C)  SpO2: 97%  Weight: 113 lb 8 oz (51.5 kg)   Body mass index is 22.92 kg/m.  Advanced Directives 01/15/2021 10/12/2019 08/07/2017 06/11/2017 04/30/2017 03/04/2017 02/18/2017  Does Patient Have a Medical Advance Directive? Yes Yes Yes Yes Yes Yes Yes  [place] of [place] Does patient want to make changes to medical advance directive? - - - - - - No - Patient declined  Copy of [place]? Yes - validated most recent copy scanned in chart (See row information) - - No - copy requested - No - copy requested -  Would patient like information on creating a medical advance directive? - - - - - - -    Current Medications (verified) Outpatient Encounter Medications as of 01/15/2021  Medication Sig   BLACK PEPPER-TURMERIC PO Take by mouth.   cholecalciferol (VITAMIN D) 1000 units tablet Take 1 tablet by mouth daily.   ibuprofen (ADVIL,MOTRIN) 200 MG tablet Take 1-2 tablets (200-400 mg total) by mouth every 6 (six) hours as needed (for pain).   Multiple Vitamins-Minerals (CENTRUM SILVER 50+WOMEN) TABS  (Patient not taking: Reported on 01/15/2021)   No facility-administered encounter medications on file as of 01/15/2021.    Allergies (verified) Patient has no known allergies.   History: Past Medical History:  Diagnosis Date   Arthritis    Blood in stool 2018   [place]    rectal ca 10/14/2016   rectal cancer-adenocarcinoma   Temporary low platelet count ([facility])    Past Surgical History:  Procedure  Laterality Date   COLON SURGERY     COLONOSCOPY     with propofol    INTRAOPERATIVE CHOLANGIOGRAM  02/18/2017   Procedure: INTRAOPERATIVE ASSESSMENT OF VASCULAR PERFUSION;  Surgeon: TLeighton Ruff MD;  [facility];  Service: General;;   LAPAROSCOPIC SPLENECTOMY  02/18/2017   Procedure: XI ROBOTIC ASSISTED SPLENECTOMY;  Surgeon: GMichael Boston MD;  [facility];  Service: General;;   NO PAST SURGERIES     XI ROBOTIC ASSISTED LOWER ANTERIOR RESECTION N/A 02/18/2017   Procedure: XI ROBOTIC ASSISTED LOWER ANTERIOR RESECTION WITH SPLENIC FLEXURE MOBILIZATION;  Surgeon: TLeighton Ruff MD;  [facility];  Service: General;  Laterality: N/A;   Family History  Problem Relation Age of Onset   Alzheimer's disease Mother    Liver disease Father 646      failure   Breast cancer Sister    Cancer Sister 533      breast cancer    Colon polyps Sister    Heart disease Maternal Grandfather    Cancer Paternal Grandmother        type unknown   Rectal cancer Paternal Grandmother    Colon polyps Brother    Esophageal cancer Neg Hx    Colon cancer Neg Hx    Stomach cancer Neg Hx    Social History   Socioeconomic History   Marital status: Widowed    Spouse name: Not on file   Number  of children: 0   Years of education: Not on file   Highest education level: Not on file  Occupational History   Occupation: retired  Tobacco Use   Smoking status: Former    Packs/day: 0.50    Years: 30.00    Pack years: 15.00    Types: Cigarettes    Quit date: 07/28/1997    Years since quitting: 23.4   Smokeless tobacco: Former  Substance and Sexual Activity   Alcohol use: Yes    Alcohol/week: 7.0 standard drinks    Types: 7 Cans of beer per week    Comment:  beer, cider   Drug use: No   Sexual activity: Not on file  Other Topics Concern   Not on file  Social History Narrative   Not on file   Social Determinants of Health   Financial Resource Strain: Low Risk    Difficulty of Paying Living  Expenses: Not hard at all  Food Insecurity: No Food Insecurity   Worried About Charity fundraiser in the Last Year: Never true   [place] in the Last Year: Never true  Transportation Needs: No Transportation Needs   Lack of Transportation (Medical): No   Lack of Transportation (Non-Medical): No  Physical Activity: Sufficiently Active   Days of Exercise per Week: 5 days   Minutes of Exercise per Session: 30 min  Stress: No Stress Concern Present   Feeling of Stress : Not at all  Social Connections: Moderately Isolated   Frequency of Communication with Friends and Family: More than three times a week   Frequency of Social Gatherings with Friends and Family: More than three times a week   Attends Religious Services: Never   Marine scientist or Organizations: Yes   Attends Archivist Meetings: 1 to 4 times per year   Marital Status: Widowed    Tobacco Counseling Counseling given: Not Answered   Clinical Intake:  Pre-visit preparation completed: Yes  Pain : No/denies pain     BMI - recorded: 22.92 Nutritional Status: BMI of 19-24  Normal Nutritional Risks: None Diabetes: No  How often do you need to have someone help you when you read instructions, pamphlets, or other written materials from your doctor or pharmacy?: 1 - Never  Diabetic?No  Interpreter Needed?: No  Information entered by :: Charlott Rakes, LPN   Activities of Daily Living In your present state of health, do you have any difficulty performing the following activities: 01/15/2021  Hearing? N  Vision? N  Difficulty concentrating or making decisions? Y  Comment at times memory  Walking or climbing stairs? N  Dressing or bathing? N  Doing errands, shopping? N  Preparing Food and eating ? N  Using the Toilet? N  In the past six months, have you accidently leaked urine? N  Do you have problems with loss of bowel control? N  Managing your Medications? N  Managing your Finances? N   Housekeeping or managing your Housekeeping? N  Some recent data might be hidden    Patient Care Team: Billie Ruddy, MD as PCP - General (Family Medicine) Pyrtle, Lajuan Lines, MD as Consulting Physician (Gastroenterology) Leighton Ruff, MD as Consulting Physician (General Surgery) Kyung Rudd, MD as Consulting Physician (Radiation Oncology) Truitt Merle, MD as Consulting Physician (Hematology) Michael Boston, MD as Consulting Physician (General Surgery)  Indicate any recent Medical Services you may have received from other than [facility] providers in the past year (date may be  approximate).     Assessment:   This is a routine wellness examination for [place].  Hearing/Vision screen Hearing Screening - Comments:: Pt denies any hearing issues  Vision Screening - Comments:: Follows up with dr Jabier Mutton for annual eye exams   Dietary issues and exercise activities discussed: Current Exercise Habits: Home exercise routine, Type of exercise: Other - see comments (riding horse , bicycle and gardening), Time (Minutes): 30, Frequency (Times/Week): 5, Weekly Exercise (Minutes/Week): 150   Goals Addressed             This Visit's Progress    Patient Stated       Maintain healthy state        Depression Screen PHQ 2/9 Scores 01/15/2021 01/12/2017  PHQ - 2 Score 0 0    Fall Risk Fall Risk  01/15/2021 01/12/2017 11/03/2016  Falls in the past year? 0 No No  Number falls in past yr: 0 - -  Injury with Fall? 0 - -  Risk for fall due to : Impaired vision - -  Follow up Falls prevention discussed - -    FALL RISK PREVENTION PERTAINING TO THE HOME:  Any stairs in or around the home? Yes  If so, are there any without handrails? No  Home free of loose throw rugs in walkways, pet beds, electrical cords, etc? Yes  Adequate lighting in your home to reduce risk of falls? Yes   ASSISTIVE DEVICES UTILIZED TO PREVENT FALLS:  Life alert? No  Use of a cane, walker or w/c? No  Grab bars in the bathroom? Yes   Shower chair or bench in shower? No  Elevated toilet seat or a handicapped toilet? No   TIMED UP AND GO:  Was the test performed? Yes .  Length of time to ambulate 10 feet: 10 sec.   Gait steady and fast without use of assistive device  Cognitive Function:     6CIT Screen 01/15/2021  What Year? 0 points  What month? 0 points  What time? 0 points  Count back from 20 0 points  Months in reverse 0 points  Repeat phrase 0 points  Total Score 0    Immunizations Immunization History  Administered Date(s) Administered   HiB (PRP-T) 01/22/2017   Influenza, High Dose Seasonal PF 04/24/2019   Meningococcal Conjugate 01/26/2017   Moderna Sars-Covid-2 Vaccination 08/26/2019, 09/23/2019, 06/16/2020, 01/03/2021   Pneumococcal Conjugate-13 01/21/2017    TDAP status: Due, Education has been provided regarding the importance of this vaccine. Advised may receive this vaccine at local pharmacy or Health Dept. Aware to provide a copy of the vaccination record if obtained from local pharmacy or Health Dept. Verbalized acceptance and understanding.  Flu Vaccine status: Due, Education has been provided regarding the importance of this vaccine. Advised may receive this vaccine at local pharmacy or Health Dept. Aware to provide a copy of the vaccination record if obtained from local pharmacy or Health Dept. Verbalized acceptance and understanding.  Pneumococcal vaccine status: Due, Education has been provided regarding the importance of this vaccine. Advised may receive this vaccine at local pharmacy or Health Dept. Aware to provide a copy of the vaccination record if obtained from local pharmacy or Health Dept. Verbalized acceptance and understanding.  Covid-19 vaccine status: Completed vaccines  Qualifies for Shingles Vaccine? Yes   Zostavax completed No   Shingrix Completed?: No.    Education has been provided regarding the importance of this vaccine. Patient has been advised to call insurance  company to determine  out of pocket expense if they have not yet received this vaccine. Advised may also receive vaccine at local pharmacy or Health Dept. Verbalized acceptance and understanding.  Screening Tests Health Maintenance  Topic Date Due   Hepatitis C Screening  Never done   TETANUS/TDAP  Never done   Zoster Vaccines- Shingrix (1 of 2) Never done   DEXA SCAN  Never done   PNA vac Low Risk Adult (2 of 2 - PPSV23) 01/21/2018   COLONOSCOPY (Pts 45-74yr Insurance coverage will need to be confirmed)  02/23/2021   INFLUENZA VACCINE  02/25/2021   COVID-19 Vaccine (5 - Booster for Moderna series) 05/05/2021   MAMMOGRAM  05/11/2022   HPV VACCINES  Aged Out    Health Maintenance  Health Maintenance Due  Topic Date Due   Hepatitis C Screening  Never done   TETANUS/TDAP  Never done   Zoster Vaccines- Shingrix (1 of 2) Never done   DEXA SCAN  Never done   PNA vac Low Risk Adult (2 of 2 - PPSV23) 01/21/2018    Colorectal cancer screening: Type of screening: Colonoscopy. Completed 02/23/18. Repeat every 3 years  Mammogram status: Completed 05/11/20. Repeat every year     Additional Screening:  Hepatitis C Screening: does qualify;   Vision Screening: Recommended annual ophthalmology exams for early detection of glaucoma and other disorders of the eye. Is the patient up to date with their annual eye exam?  Yes  Who is the provider or what is the name of the office in which the patient attends annual eye exams? Dr RJabier MuttonIf pt is not established with a provider, would they like to be referred to a provider to establish care? No .   Dental Screening: Recommended annual dental exams for proper oral hygiene  Community Resource Referral / Chronic Care Management: CRR required this visit?  No   CCM required this visit?  No      Plan:     I have personally reviewed and noted the following in the patient's chart:   Medical and social history Use of alcohol, tobacco or illicit  drugs  Current medications and supplements including opioid prescriptions. Patient is not currently taking opioid prescriptions. Functional ability and status Nutritional status Physical activity Advanced directives List of other physicians Hospitalizations, surgeries, and ER visits in previous 12 months Vitals Screenings to include cognitive, depression, and falls Referrals and appointments  In addition, I have reviewed and discussed with patient certain preventive protocols, quality metrics, and best practice recommendations. A written personalized care plan for preventive services as well as general preventive health recommendations were provided to patient.     TWillette Brace LPN   [id number]  Nurse Notes: None

## 2021-01-15 NOTE — Patient Instructions (Addendum)
Allison Dunlap , Thank you for taking time to come for your Medicare Wellness Visit. I appreciate your ongoing commitment to your health goals. Please review the following plan we discussed and let me know if I can assist you in the future.   Screening recommendations/referrals: Colonoscopy: Done 02/23/18 Mammogram: Done 05/11/20 repeat every year Recommended yearly ophthalmology/optometry visit for glaucoma screening and checkup Recommended yearly dental visit for hygiene and checkup  Vaccinations: Influenza vaccine: Due 02/25/21 Pneumococcal vaccine: Due and discussed Tdap vaccine: Due and discussed Shingles vaccine: Shingrix discussed. Please contact your pharmacy for coverage information.    Covid-19:Completed 1/29, 2/26, 06/16/20 & 01/03/21  Advanced directives: copies in chart   Conditions/risks identified: Stay healthy  Next appointment: Follow up in one year for your annual wellness visit    Preventive Care 65 Years and Older, Female Preventive care refers to lifestyle choices and visits with your health care provider that can promote health and wellness. What does preventive care include? A yearly physical exam. This is also called an annual well check. Dental exams once or twice a year. Routine eye exams. Ask your health care provider how often you should have your eyes checked. Personal lifestyle choices, including: Daily care of your teeth and gums. Regular physical activity. Eating a healthy diet. Avoiding tobacco and drug use. Limiting alcohol use. Practicing safe sex. Taking low-dose aspirin every day. Taking vitamin and mineral supplements as recommended by your health care provider. What happens during an annual well check? The services and screenings done by your health care provider during your annual well check will depend on your age, overall health, lifestyle risk factors, and family history of disease. Counseling  Your health care provider may ask you  questions about your: Alcohol use. Tobacco use. Drug use. Emotional well-being. Home and relationship well-being. Sexual activity. Eating habits. History of falls. Memory and ability to understand (cognition). Work and work Statistician. Reproductive health. Screening  You may have the following tests or measurements: Height, weight, and BMI. Blood pressure. Lipid and cholesterol levels. These may be checked every 5 years, or more frequently if you are over 16 years old. Skin check. Lung cancer screening. You may have this screening every year starting at age 43 if you have a 30-pack-year history of smoking and currently smoke or have quit within the past 15 years. Fecal occult blood test (FOBT) of the stool. You may have this test every year starting at age 11. Flexible sigmoidoscopy or colonoscopy. You may have a sigmoidoscopy every 5 years or a colonoscopy every 10 years starting at age 38. Hepatitis C blood test. Hepatitis B blood test. Sexually transmitted disease (STD) testing. Diabetes screening. This is done by checking your blood sugar (glucose) after you have not eaten for a while (fasting). You may have this done every 1-3 years. Bone density scan. This is done to screen for osteoporosis. You may have this done starting at age 44. Mammogram. This may be done every 1-2 years. Talk to your health care provider about how often you should have regular mammograms. Talk with your health care provider about your test results, treatment options, and if necessary, the need for more tests. Vaccines  Your health care provider may recommend certain vaccines, such as: Influenza vaccine. This is recommended every year. Tetanus, diphtheria, and acellular pertussis (Tdap, Td) vaccine. You may need a Td booster every 10 years. Zoster vaccine. You may need this after age 50. Pneumococcal 13-valent conjugate (PCV13) vaccine. One dose is recommended after  age 72. Pneumococcal polysaccharide  (PPSV23) vaccine. One dose is recommended after age 81. Talk to your health care provider about which screenings and vaccines you need and how often you need them. This information is not intended to replace advice given to you by your health care provider. Make sure you discuss any questions you have with your health care provider. Document Released: 08/10/2015 Document Revised: 04/02/2016 Document Reviewed: 05/15/2015 Elsevier Interactive Patient Education  2017 [place] Prevention in the Home Falls can cause injuries. They can happen to people of all ages. There are many things you can do to make your home safe and to help prevent falls. What can I do on the outside of my home? Regularly fix the edges of walkways and driveways and fix any cracks. Remove anything that might make you trip as you walk through a door, such as a raised step or threshold. Trim any bushes or trees on the path to your home. Use bright outdoor lighting. Clear any walking paths of anything that might make someone trip, such as rocks or tools. Regularly check to see if handrails are loose or broken. Make sure that both sides of any steps have handrails. Any raised decks and porches should have guardrails on the edges. Have any leaves, snow, or ice cleared regularly. Use sand or salt on walking paths during winter. Clean up any spills in your garage right away. This includes oil or grease spills. What can I do in the bathroom? Use night lights. Install grab bars by the toilet and in the tub and shower. Do not use towel bars as grab bars. Use non-skid mats or decals in the tub or shower. If you need to sit down in the shower, use a plastic, non-slip stool. Keep the floor dry. Clean up any water that spills on the floor as soon as it happens. Remove soap buildup in the tub or shower regularly. Attach bath mats securely with double-sided non-slip rug tape. Do not have throw rugs and other things on the  floor that can make you trip. What can I do in the bedroom? Use night lights. Make sure that you have a light by your bed that is easy to reach. Do not use any sheets or blankets that are too big for your bed. They should not hang down onto the floor. Have a firm chair that has side arms. You can use this for support while you get dressed. Do not have throw rugs and other things on the floor that can make you trip. What can I do in the kitchen? Clean up any spills right away. Avoid walking on wet floors. Keep items that you use a lot in easy-to-reach places. If you need to reach something above you, use a strong step stool that has a grab bar. Keep electrical cords out of the way. Do not use floor polish or wax that makes floors slippery. If you must use wax, use non-skid floor wax. Do not have throw rugs and other things on the floor that can make you trip. What can I do with my stairs? Do not leave any items on the stairs. Make sure that there are handrails on both sides of the stairs and use them. Fix handrails that are broken or loose. Make sure that handrails are as long as the stairways. Check any carpeting to make sure that it is firmly attached to the stairs. Fix any carpet that is loose or worn. Avoid having throw rugs  at the top or bottom of the stairs. If you do have throw rugs, attach them to the floor with carpet tape. Make sure that you have a light switch at the top of the stairs and the bottom of the stairs. If you do not have them, ask someone to add them for you. What else can I do to help prevent falls? Wear shoes that: Do not have high heels. Have rubber bottoms. Are comfortable and fit you well. Are closed at the toe. Do not wear sandals. If you use a stepladder: Make sure that it is fully opened. Do not climb a closed stepladder. Make sure that both sides of the stepladder are locked into place. Ask someone to hold it for you, if possible. Clearly mark and make  sure that you can see: Any grab bars or handrails. First and last steps. Where the edge of each step is. Use tools that help you move around (mobility aids) if they are needed. These include: Canes. Walkers. Scooters. Crutches. Turn on the lights when you go into a dark area. Replace any light bulbs as soon as they burn out. Set up your furniture so you have a clear path. Avoid moving your furniture around. If any of your floors are uneven, fix them. If there are any pets around you, be aware of where they are. Review your medicines with your doctor. Some medicines can make you feel dizzy. This can increase your chance of falling. Ask your doctor what other things that you can do to help prevent falls. This information is not intended to replace advice given to you by your health care provider. Make sure you discuss any questions you have with your health care provider. Document Released: 05/10/2009 Document Revised: 12/20/2015 Document Reviewed: 08/18/2014 Elsevier Interactive Patient Education  2017 [employer].

## 2021-01-16 ENCOUNTER — Other Ambulatory Visit: Payer: Self-pay

## 2021-01-17 ENCOUNTER — Ambulatory Visit (INDEPENDENT_AMBULATORY_CARE_PROVIDER_SITE_OTHER): Payer: Medicare HMO | Admitting: Family Medicine

## 2021-01-17 ENCOUNTER — Encounter: Payer: Self-pay | Admitting: Family Medicine

## 2021-01-17 VITALS — BP 118/76 | HR 64 | Temp 98.4°F | Wt 113.2 lb

## 2021-01-17 DIAGNOSIS — Z13220 Encounter for screening for lipoid disorders: Secondary | ICD-10-CM

## 2021-01-17 DIAGNOSIS — Z23 Encounter for immunization: Secondary | ICD-10-CM

## 2021-01-17 DIAGNOSIS — Z1159 Encounter for screening for other viral diseases: Secondary | ICD-10-CM | POA: Diagnosis not present

## 2021-01-17 DIAGNOSIS — C20 Malignant neoplasm of rectum: Secondary | ICD-10-CM | POA: Diagnosis not present

## 2021-01-17 DIAGNOSIS — Z780 Asymptomatic menopausal state: Secondary | ICD-10-CM | POA: Diagnosis not present

## 2021-01-17 DIAGNOSIS — Z0000 Encounter for general adult medical examination without abnormal findings: Secondary | ICD-10-CM

## 2021-01-17 DIAGNOSIS — D6959 Other secondary thrombocytopenia: Secondary | ICD-10-CM

## 2021-01-17 LAB — LIPID PANEL
Cholesterol: 220 mg/dL — ABNORMAL HIGH (ref 0–200)
HDL: 78.8 mg/dL (ref >39.00)
LDL Cholesterol: 125 mg/dL — ABNORMAL HIGH (ref 0–99)
NonHDL: 140.8
Total CHOL/HDL Ratio: 3
Triglycerides: 78 mg/dL (ref 0.0–149.0)
VLDL: 15.6 mg/dL (ref 0.0–40.0)

## 2021-01-17 LAB — VITAMIN D 25 HYDROXY (VIT D DEFICIENCY, FRACTURES): VITD: 58.37 ng/mL (ref 30.00–100.00)

## 2021-01-17 LAB — TSH: TSH: 1.17 u[IU]/mL (ref 0.35–4.50)

## 2021-01-17 LAB — HEMOGLOBIN A1C: Hgb A1c MFr Bld: 5.7 % (ref 4.6–6.5)

## 2021-01-17 LAB — T4, FREE: Free T4: 0.72 ng/dL (ref 0.60–1.60)

## 2021-01-17 NOTE — Progress Notes (Signed)
Subjective:   Chief Complaint  Patient presents with   Follow-up    States oncologist wanted her to get checked by pcp, cholesterol, vitamin D. Mentioned about getting checked for disease from [facility]     Allison Dunlap is a 70 y.o. female and is here for a comprehensive physical exam.  Patient lost to follow-up, last seen 01/11/18.  Pt followed by oncology for rectal cancer, s/p xrt, chemo, lower anterior resection.  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  Pt doing well.  Had mammogram several months ago.  States her oncologist wanted her to have labs.   Social History   Socioeconomic History   Marital status: Widowed    Spouse name: Not on file   Number of children: 0   Years of education: Not on file   Highest education level: Not on file  Occupational History   Occupation: retired  Tobacco Use   Smoking status: Former    Packs/day: 0.50    Years: 30.00    Pack years: 15.00    Types: Cigarettes    Quit date: 07/28/1997    Years since quitting: 23.4   Smokeless tobacco: Former  Substance and Sexual Activity   Alcohol use: Yes    Alcohol/week: 7.0 standard drinks    Types: 7 Cans of beer per week    Comment:  beer, cider   Drug use: No   Sexual activity: Not on file  Other Topics Concern   Not on file  Social History Narrative   Not on file   Social Determinants of Health   Financial Resource Strain: Low Risk    Difficulty of Paying Living Expenses: Not hard at all  Food Insecurity: No Food Insecurity   Worried About Charity fundraiser in the Last Year: Never true   [place] in the Last Year: Never true  Transportation Needs: No Transportation Needs   Lack of Transportation (Medical): No   Lack of Transportation (Non-Medical): No  Physical Activity: Sufficiently Active   Days of Exercise per Week: 5 days   Minutes of Exercise per Session: 30 min  Stress: No Stress Concern Present   Feeling of Stress : Not at all  Social  Connections: Moderately Isolated   Frequency of Communication with Friends and Family: More than three times a week   Frequency of Social Gatherings with Friends and Family: More than three times a week   Attends Religious Services: Never   Marine scientist or Organizations: Yes   Attends Archivist Meetings: 1 to 4 times per year   Marital Status: Widowed  Human resources officer Violence: Not At Risk   Fear of Current or Ex-Partner: No   Emotionally Abused: No   Physically Abused: No   Sexually Abused: No   Health Maintenance  Topic Date Due   Hepatitis C Screening  Never done   TETANUS/TDAP  Never done   Zoster Vaccines- Shingrix (1 of 2) Never done   DEXA SCAN  Never done   PNA vac Low Risk Adult (2 of 2 - PPSV23) 01/21/2018   COLONOSCOPY (Pts 45-40yr Insurance coverage will need to be confirmed)  02/23/2021   INFLUENZA VACCINE  02/25/2021   COVID-19 Vaccine (5 - Booster for Moderna series) 05/05/2021   MAMMOGRAM  05/11/2022   HPV VACCINES  Aged Out   Family History  Problem Relation Age of Onset   Alzheimer's disease Mother    Liver disease Father  97       failure   Breast cancer Sister    Cancer Sister 62       breast cancer    Colon polyps Sister    Heart disease Maternal Grandfather    Cancer Paternal Grandmother        type unknown   Rectal cancer Paternal Grandmother    Colon polyps Brother    Esophageal cancer Neg Hx    Colon cancer Neg Hx    Stomach cancer Neg Hx     The following portions of the patient's history were reviewed and updated as appropriate: allergies, current medications, past family history, past medical history, past social history, past surgical history, and problem list.  Review of Systems Pertinent items noted in HPI and remainder of comprehensive ROS otherwise negative.   Objective:    BP 118/76 (BP Location: Left Arm, Patient Position: Sitting, Cuff Size: Normal)   Pulse 64   Temp 98.4 F (36.9 C) (Oral)   Wt 113 lb  3.2 oz (51.3 kg)   SpO2 98%   BMI 22.86 kg/m  General appearance: alert, cooperative, and no distress Head: Normocephalic, without obvious abnormality, atraumatic Eyes: conjunctivae/corneas clear. PERRL, EOM's intact. Fundi benign. Ears: normal TM's and external ear canals both ears Nose: Nares normal. Septum midline. Mucosa normal. No drainage or sinus tenderness. Throat: lips, mucosa, and tongue normal; teeth and gums normal Neck: no adenopathy, no carotid bruit, no JVD, supple, symmetrical, trachea midline, and thyroid not enlarged, symmetric, no tenderness/mass/nodules Lungs: clear to auscultation bilaterally Heart: regular rate and rhythm, S1, S2 normal, no murmur, click, rub or gallop Abdomen: soft, non-tender; bowel sounds normal; no masses,  no organomegaly Extremities: extremities normal, atraumatic, no cyanosis or edema Pulses: 2+ and symmetric Skin: Skin color, texture, turgor normal. No rashes or lesions Lymph nodes: Cervical, supraclavicular, and axillary nodes normal. Neurologic: Alert and oriented X 3, normal strength and tone. Normal symmetric reflexes. Normal coordination and gait    Assessment:    Healthy female exam.      Plan:    Anticipatory guidance given including wearing seatbelts, smoke detectors in the home, increasing physical activity, increasing p.o. intake of water and vegetables. -Obtain labs. 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  Grier Mitts, MD

## 2021-01-18 LAB — HEPATITIS C ANTIBODY
Hepatitis C Ab: NONREACTIVE
SIGNAL TO CUT-OFF: 0.01 (ref <1.00)

## 2021-02-19 ENCOUNTER — Other Ambulatory Visit: Payer: Self-pay

## 2021-02-19 ENCOUNTER — Ambulatory Visit (INDEPENDENT_AMBULATORY_CARE_PROVIDER_SITE_OTHER)
Admission: RE | Admit: 2021-02-19 | Discharge: 2021-02-19 | Disposition: A | Discharge status: Home / Self Care | Payer: Medicare HMO | Source: Ambulatory Visit | Attending: Family Medicine | Admitting: Family Medicine

## 2021-02-19 DIAGNOSIS — Z780 Asymptomatic menopausal state: Secondary | ICD-10-CM

## 2021-03-20 IMAGING — CT CT CHEST W/ CM
2 of 4 series · 14 of 36 positions shown, 17 images · IV contrast (OMNIPAQUE)
Comparison: 08/05/2018

CLINICAL DATA: Rectal cancer, stage 2-3. Post primary adjuvant
therapy. Diagnosed in 5291 with chemotherapy and radiation therapy
complete. Colon resection. Splenectomy. Occasional diarrhea. No
chest complaints.

EXAM:
CT CHEST, ABDOMEN, AND PELVIS WITH CONTRAST
TECHNIQUE: Multidetector CT imaging of the chest, abdomen and pelvis was
performed following the standard protocol during bolus
administration of intravenous contrast.
CONTRAST:  100mL OMNIPAQUE IOHEXOL 300 MG/ML  SOLN

[Series 2: cap with · axial · 0.63mm/px · z∈[-592,-62]mm · 11 of 118 slices shown, 14 images]
[im 6/118  mediastinal]
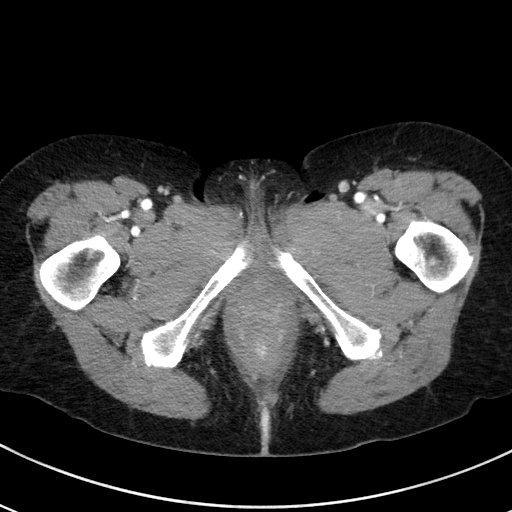
[im 6/118  lung]
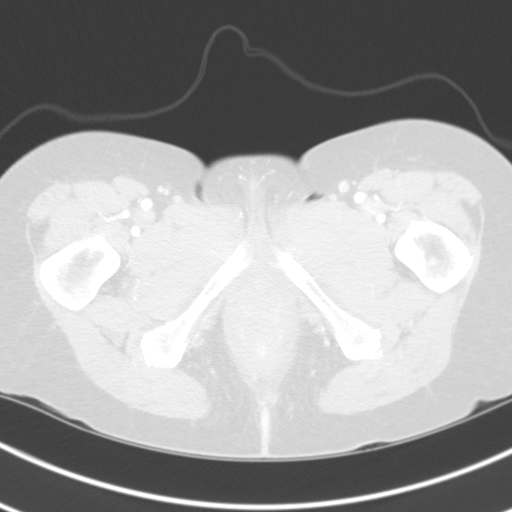
[im 18/118  lung]
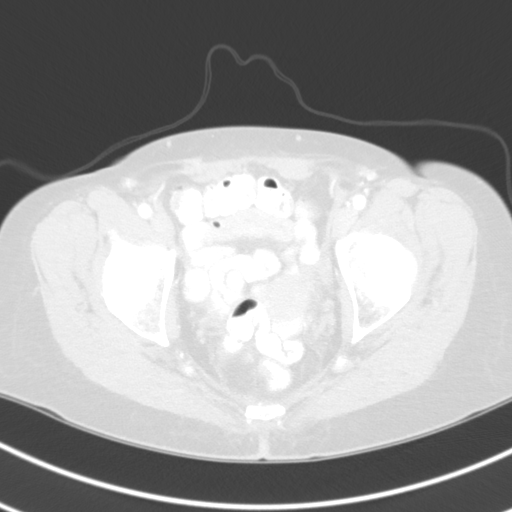
[im 30/118  lung]
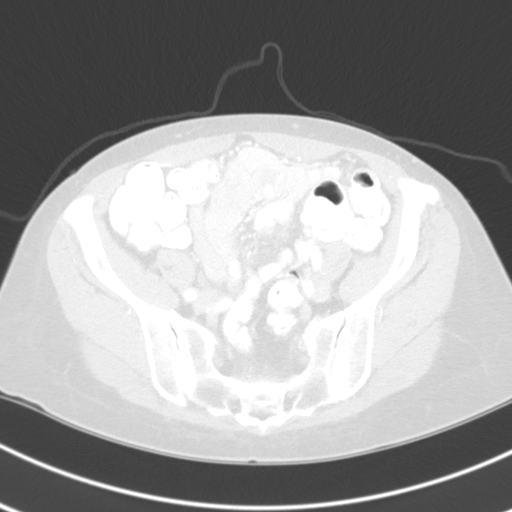
[im 41/118  lung]
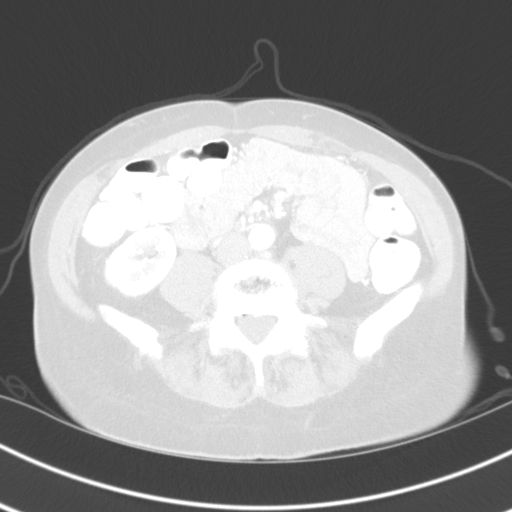
[im 47/118  mediastinal]
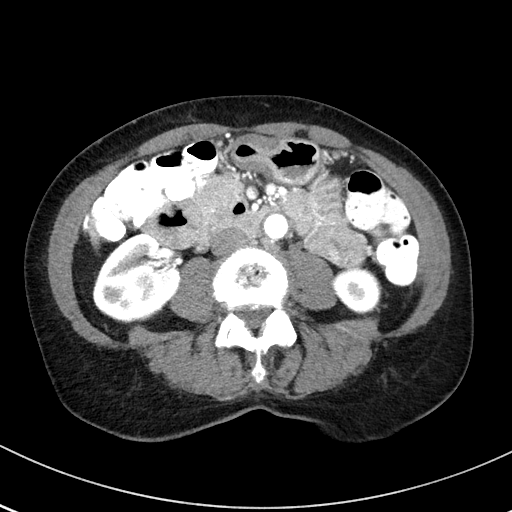
[im 47/118  lung]
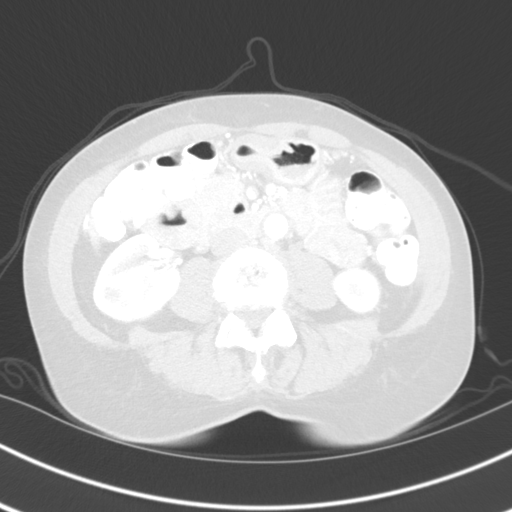
[im 59/118  lung]
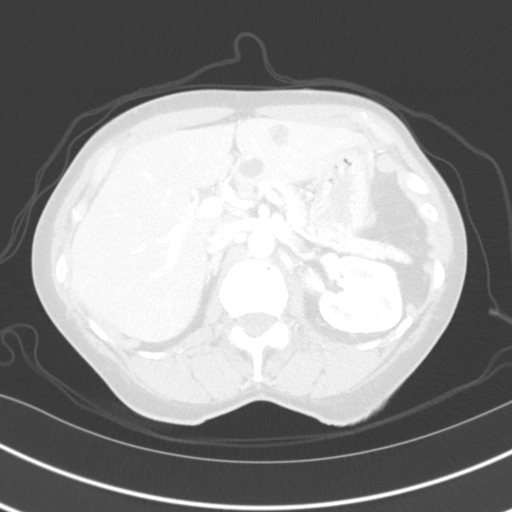
[im 71/118  lung]
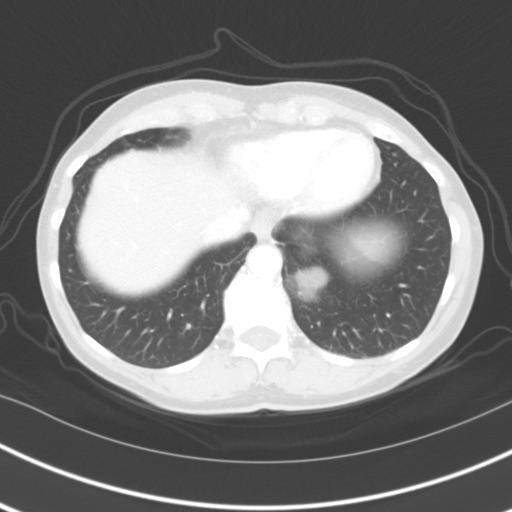
[im 77/118  lung]
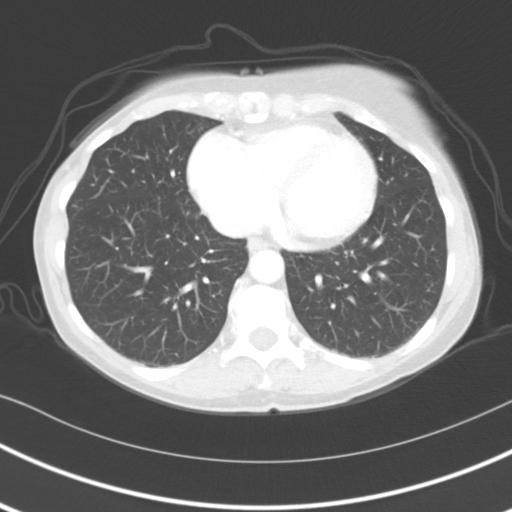
[im 88/118  mediastinal]
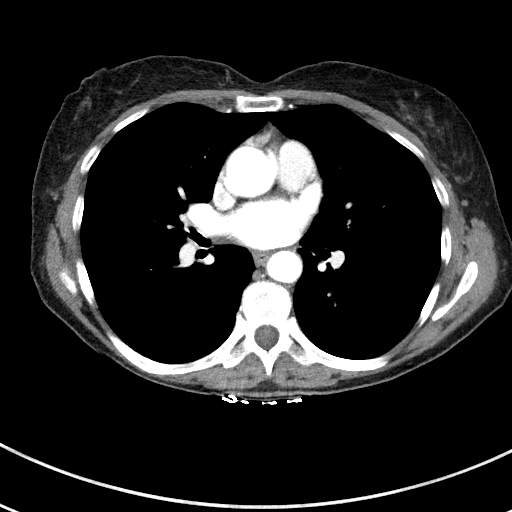
[im 88/118  lung]
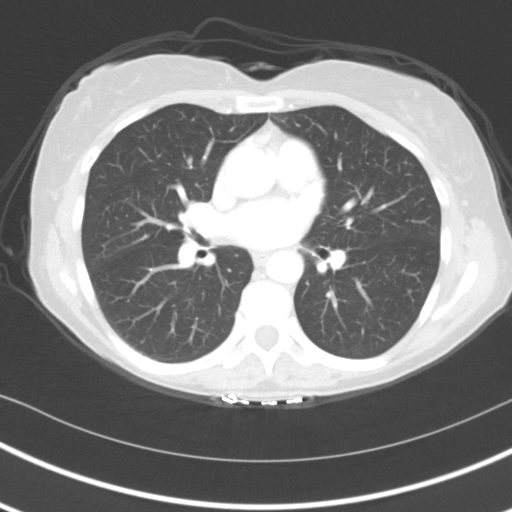
[im 100/118  lung]
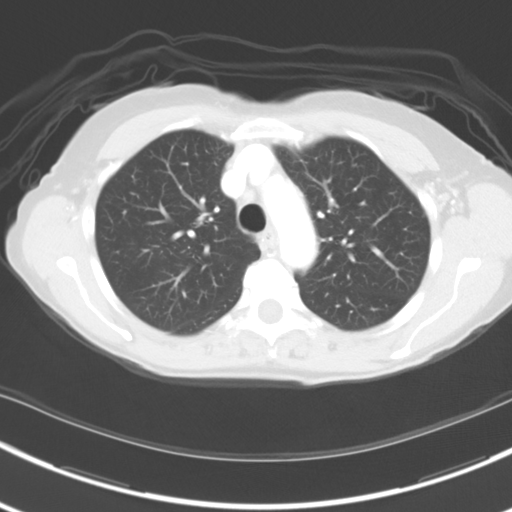
[im 112/118  lung]
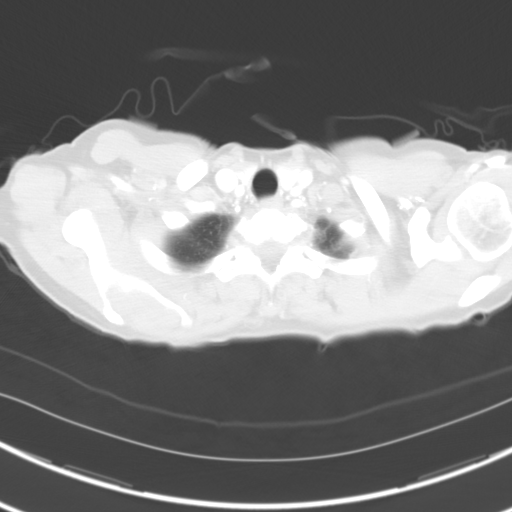

[Series 4: coronals · coronal · 0.76mm/px · 3 of 127 slices shown]
[im 26/127  lung]
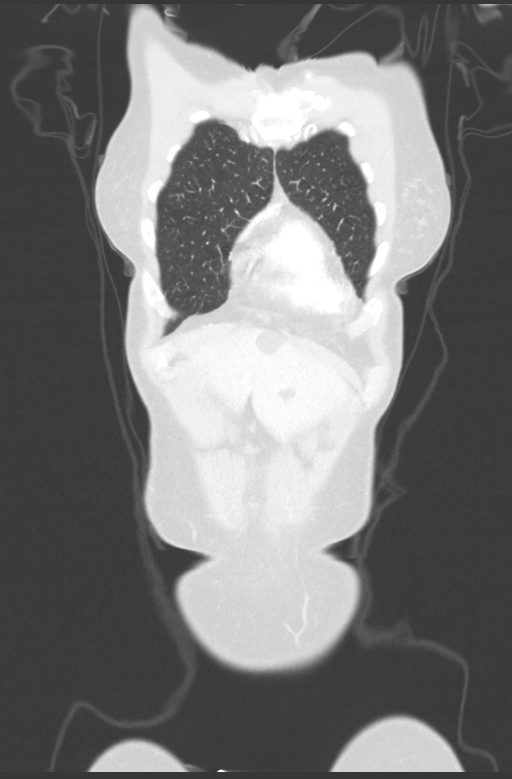
[im 51/127  lung]
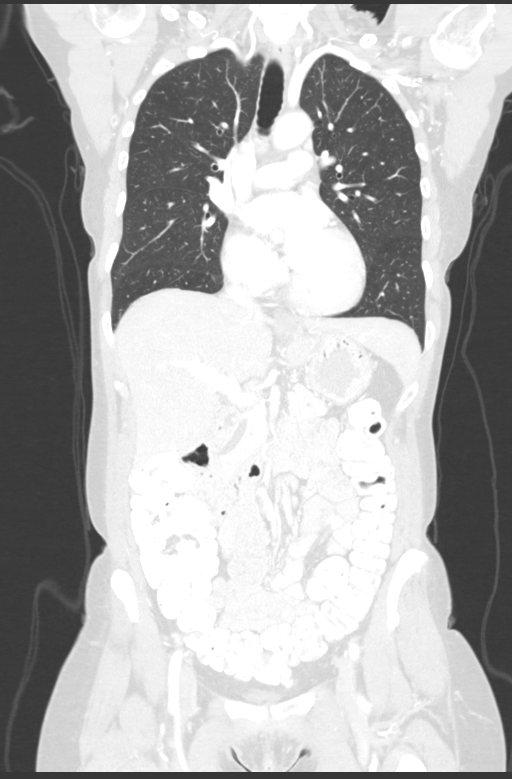
[im 76/127  lung]
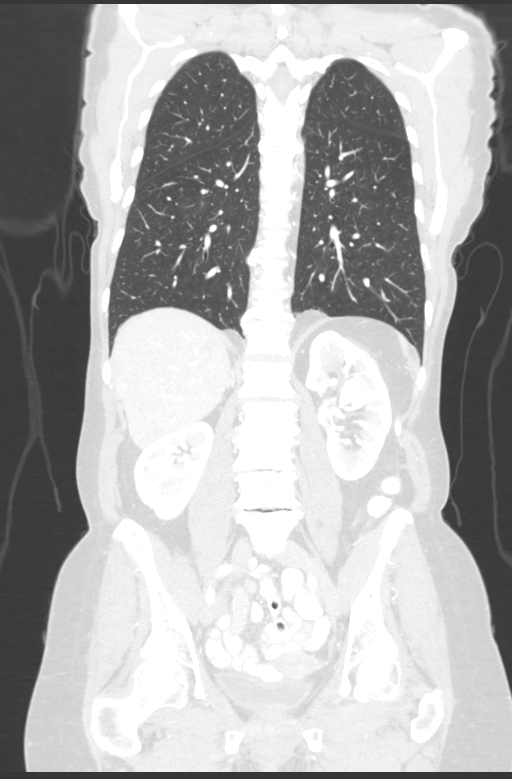

[14 of 36 positions shown; findings below may reference images not displayed]

FINDINGS: CT CHEST FINDINGS

Cardiovascular: Aortic atherosclerosis. Borderline cardiomegaly,
without pericardial effusion. No central pulmonary embolism, on this
non-dedicated study.

Mediastinum/Nodes: A left-sided thyroid nodule measures 1.0 cm and
is similar to on the prior. No supraclavicular adenopathy. No
mediastinal or hilar adenopathy.

Lungs/Pleura: No pleural fluid. Mild centrilobular emphysema.

No suspicious pulmonary nodule or mass.

Musculoskeletal: No acute osseous abnormality.

CT ABDOMEN PELVIS FINDINGS

Hepatobiliary: Hepatic cysts and too small to characterize lesions
are similar Normal gallbladder, without biliary ductal dilatation.

Pancreas: Probable pancreas divisum, with a prominent dorsal duct
entering the duodenum on 69/2. No acute pancreatitis or duct
dilatation.

Spleen: Splenectomy.

Adrenals/Urinary Tract: Normal adrenal glands. Normal kidneys,
without hydronephrosis. Normal urinary bladder.

Stomach/Bowel: Normal stomach, without wall thickening. Surgical
sutures at the rectosigmoid junction. No locally recurrent disease.
Scattered colonic diverticula. Normal terminal ileum and appendix.
Normal small bowel.

Vascular/Lymphatic: Aortic atherosclerosis. No abdominopelvic
adenopathy.

Reproductive: Normal uterus and adnexa.

Other: No significant free fluid. Mild pelvic floor laxity. No
evidence of omental or peritoneal disease.

A tiny fat containing left inguinal hernia is unchanged.

Musculoskeletal: Lumbar spondylosis, including degenerative disc
disease at L2-3 through L4-5.
IMPRESSION: 1. Surgical sutures at the rectosigmoid junction. No acute process
or evidence of metastatic disease in the chest, abdomen, or pelvis.
2. Probable pancreas divisum.
3. Aortic Atherosclerosis ([id number]).
4.  Emphysema ([id number]).

## 2021-04-01 ENCOUNTER — Encounter: Payer: Self-pay | Admitting: Internal Medicine

## 2021-07-08 DIAGNOSIS — Z20822 Contact with and (suspected) exposure to covid-19: Secondary | ICD-10-CM | POA: Diagnosis not present

## 2021-07-31 ENCOUNTER — Ambulatory Visit: Payer: Medicare HMO | Admitting: Family Medicine

## 2021-08-07 ENCOUNTER — Ambulatory Visit: Payer: Medicare HMO | Admitting: Family Medicine

## 2021-08-12 ENCOUNTER — Ambulatory Visit (INDEPENDENT_AMBULATORY_CARE_PROVIDER_SITE_OTHER): Payer: Medicare HMO | Admitting: Family Medicine

## 2021-08-12 ENCOUNTER — Encounter: Payer: Self-pay | Admitting: Family Medicine

## 2021-08-12 VITALS — BP 116/84 | HR 64 | Temp 97.4°F | Wt 115.6 lb

## 2021-08-12 DIAGNOSIS — I700 Atherosclerosis of aorta: Secondary | ICD-10-CM | POA: Diagnosis not present

## 2021-08-12 DIAGNOSIS — J069 Acute upper respiratory infection, unspecified: Secondary | ICD-10-CM | POA: Diagnosis not present

## 2021-08-12 DIAGNOSIS — Z85048 Personal history of other malignant neoplasm of rectum, rectosigmoid junction, and anus: Secondary | ICD-10-CM

## 2021-08-12 DIAGNOSIS — E785 Hyperlipidemia, unspecified: Secondary | ICD-10-CM | POA: Insufficient documentation

## 2021-08-12 DIAGNOSIS — E782 Mixed hyperlipidemia: Secondary | ICD-10-CM

## 2021-08-12 NOTE — Progress Notes (Signed)
Subjective:    Patient ID: Allison Dunlap, female    DOB: 09-Nov-1950, 71 y.o.   MRN: [id number]  Chief Complaint  Patient presents with   Follow-up    Just making sure things are going well. 6 month since CPE    HPI Patient was seen today for f/u.  Patient states she has been doing well.  Pt thinks she had the flu last wk.  Without current symptoms.  Denies SOB, fever, headache, chills.  Endorses regular follow-up oncology for history of rectal cancer.  Needs to schedule colonoscopy.    Past Medical History:  Diagnosis Date   Arthritis    Blood in stool 2018   Clotting disorder ([place])    rectal ca 10/14/2016   rectal cancer-adenocarcinoma   Temporary low platelet count ([facility])     No Known Allergies  ROS General: Denies fever, chills, night sweats, changes in weight, changes in appetite HEENT: Denies headaches, ear pain, changes in vision, rhinorrhea, sore throat CV: Denies CP, palpitations, SOB, orthopnea Pulm: Denies SOB, cough, wheezing GI: Denies abdominal pain, nausea, vomiting, diarrhea, constipation GU: Denies dysuria, hematuria, frequency, vaginal discharge Msk: Denies muscle cramps, joint pains Neuro: Denies weakness, numbness, tingling Skin: Denies rashes, bruising Psych: Denies depression, anxiety, hallucinations    Objective:    Blood pressure 116/84, pulse 64, temperature (!) 97.4 F (36.3 C), temperature source Oral, weight 115 lb 9.6 oz (52.4 kg), SpO2 96 %.  Gen. Pleasant, well-nourished, in no distress, normal affect   HEENT: [place]/AT, face symmetric, conjunctiva clear, no scleral icterus, PERRLA, EOMI, nares patent without drainage, pharynx without erythema or exudate. Neck: No JVD, no carotid bruits Lungs: no accessory muscle use, CTAB, no wheezes or rales Cardiovascular: RRR, no m/r/g, no peripheral edema Abdomen: BS present, soft, NT/ND Musculoskeletal: No deformities, no cyanosis or clubbing, normal tone Neuro:  A&Ox3, CN II-XII intact, normal  gait Skin:  Warm, no lesions/ rash.  Mild erythema of b/l nares with eschar at medial L nares   Wt Readings from Last 3 Encounters:  08/12/21 115 lb 9.6 oz (52.4 kg)  01/17/21 113 lb 3.2 oz (51.3 kg)  01/15/21 113 lb 8 oz (51.5 kg)    Lab Results  Component Value Date   WBC 7.9 01/10/2021   HGB 14.1 01/10/2021   HCT 42.3 01/10/2021   PLT 398 01/10/2021   GLUCOSE 93 01/10/2021   CHOL 220 (H) 01/17/2021   TRIG 78.0 01/17/2021   HDL 78.80 01/17/2021   LDLCALC 125 (H) 01/17/2021   ALT 11 01/10/2021   AST 19 01/10/2021   NA 140 01/10/2021   K 4.3 01/10/2021   CL 107 01/10/2021   CREATININE 0.70 01/10/2021   BUN 10 01/10/2021   CO2 24 01/10/2021   TSH 1.17 01/17/2021   HGBA1C 5.7 01/17/2021    Assessment/Plan:  Viral URI -presumed to be influenza -Patient to consider influenza vaccine -resolved  Aortic atherosclerosis ([place]) -as noted on imaging CT chest with contrast 10/07/19 -lifestyle modifications -not currently on meds -consider statin  History of rectal cancer -Follow-up colonoscopy due.  Patient to schedule -continue f/u with Oncology  F/u in 6 months for CPE  Grier Mitts, MD

## 2021-08-27 ENCOUNTER — Encounter: Payer: Self-pay | Admitting: Internal Medicine

## 2021-09-16 ENCOUNTER — Ambulatory Visit (AMBULATORY_SURGERY_CENTER): Payer: Medicare HMO | Admitting: *Deleted

## 2021-09-16 ENCOUNTER — Other Ambulatory Visit: Payer: Self-pay

## 2021-09-16 VITALS — Ht 59.0 in | Wt 115.0 lb

## 2021-09-16 DIAGNOSIS — Z85038 Personal history of other malignant neoplasm of large intestine: Secondary | ICD-10-CM

## 2021-09-16 DIAGNOSIS — Z86010 Personal history of colonic polyps: Secondary | ICD-10-CM

## 2021-09-16 MED ORDER — PEG 3350-KCL-NA BICARB-NACL 420 G PO SOLR: 4000.0000 mL | Freq: Once | ORAL | 0 refills | Status: AC

## 2021-09-16 NOTE — Progress Notes (Signed)
Patient's pre-visit was done today over the phone with the patient. Name,DOB and address verified. Patient denies any allergies to Eggs and Soy. Patient denies any problems with anesthesia/sedation. Patient is not taking any diet pills or blood thinners. No home Oxygen.   Prep instructions sent to pt's MyChart-pt aware. Patient understands to call us back with any questions or concerns. Patient is aware of our care-partner policy and [id number] safety protocol.   EMMI education assigned to the patient for the procedure, sent to [place].   The patient is COVID-19 vaccinated.

## 2021-09-23 ENCOUNTER — Encounter: Payer: Self-pay | Admitting: Hematology

## 2021-09-23 ENCOUNTER — Encounter: Payer: Self-pay | Admitting: Internal Medicine

## 2021-09-26 ENCOUNTER — Telehealth: Payer: Self-pay

## 2021-09-26 NOTE — Telephone Encounter (Signed)
Called and spoke with pt and tried to explain to her that the colon could not be done in the [facility] and had to be done at the hospital. Pt upset and does not understand why, states she had her last colon done here and the procedure that was done in 2018 that deemed her a difficult intubation was prior to the last colon she had here. Explained I would have Osvaldo Angst contact the pt to explain this further. Pt wanted to know what his phone number was because she does not answer calls from numbers she does not know, explained we did not have a number to give out for him. The pt wanted to know what time he would call, let her know he was doing procedures upstairs and that information could not be provided. John please call this pt. ?

## 2021-09-26 NOTE — Telephone Encounter (Signed)
Called and left message for pt to call back regarding cancelling procedure in the [place]. ?

## 2021-09-26 NOTE — Telephone Encounter (Signed)
-----   Message from Jerene Bears, MD sent at 09/25/2021  9:46 AM EST ----- ?Vaughan Basta and [place], ?Please see below ?Pt's procedure needs to be moved to outpt hospital setting for surveillance of colon cancer ?JMP ?  ?----- Message ----- ?From: Osvaldo Angst, CRNA ?Sent: 09/24/2021   4:36 PM EST ?To: Jerene Bears, MD ? ?Dr. Hilarie Fredrickson, ? ?This pt is scheduled with you on 09/30/2021.  She is a documented difficult intubation and hier procedure will need to be done at the hospital.   ? ?Thanks, ? ?Osvaldo Angst ? ?Procedure Name: Intubation ?Date/Time: 02/18/2017 8:41 AM ?Performed by: Enrigue Catena E ?Pre-anesthesia Checklist: Patient identified, Emergency Drugs available, Suction available and Patient being monitored ?Patient Re-evaluated:Patient Re-evaluated prior to induction ?Oxygen Delivery Method: Circle system utilized ?Preoxygenation: Pre-oxygenation with 100% oxygen ?Induction Type: IV induction ?Ventilation: Mask ventilation without difficulty ?Laryngoscope Size: 3 and Glidescope ?Grade View: Grade III ?Tube type: Oral ?Tube size: 7.0 mm ?Number of attempts: 2 ?Airway Equipment and Method: Stylet and Video-laryngoscopy ?Placement Confirmation: ETT inserted through vocal cords under direct vision,  positive ETCO2 and breath sounds checked- equal and bilateral ?Secured at: 22 cm ?Tube secured with: Tape ?Dental Injury: Teeth and Oropharynx as per pre-operative assessment  ?Comments: Difficult intubation.  Grade III view only of epiglottis by SRNA, confirmed by attending.   ? ? ?

## 2021-09-27 NOTE — Telephone Encounter (Signed)
No update from Allison Angst CRNA at this time. Appt for Monday in the [place] cancelled. ?

## 2021-09-30 ENCOUNTER — Encounter: Payer: Medicare HMO | Admitting: Internal Medicine

## 2021-10-10 ENCOUNTER — Encounter: Payer: Self-pay | Admitting: Hematology

## 2021-10-10 ENCOUNTER — Inpatient Hospital Stay: Payer: Medicare HMO | Attending: Hematology | Admitting: Hematology

## 2021-10-10 ENCOUNTER — Other Ambulatory Visit: Payer: Self-pay

## 2021-10-10 ENCOUNTER — Inpatient Hospital Stay: Payer: Medicare HMO

## 2021-10-10 VITALS — BP 149/87 | HR 71 | Temp 98.4°F | Resp 17 | Ht 59.0 in | Wt 114.7 lb

## 2021-10-10 DIAGNOSIS — Z9081 Acquired absence of spleen: Secondary | ICD-10-CM | POA: Insufficient documentation

## 2021-10-10 DIAGNOSIS — K7689 Other specified diseases of liver: Secondary | ICD-10-CM | POA: Diagnosis not present

## 2021-10-10 DIAGNOSIS — C20 Malignant neoplasm of rectum: Secondary | ICD-10-CM

## 2021-10-10 DIAGNOSIS — C19 Malignant neoplasm of rectosigmoid junction: Secondary | ICD-10-CM | POA: Diagnosis not present

## 2021-10-10 DIAGNOSIS — D693 Immune thrombocytopenic purpura: Secondary | ICD-10-CM | POA: Insufficient documentation

## 2021-10-10 DIAGNOSIS — Z923 Personal history of irradiation: Secondary | ICD-10-CM | POA: Insufficient documentation

## 2021-10-10 DIAGNOSIS — I700 Atherosclerosis of aorta: Secondary | ICD-10-CM | POA: Insufficient documentation

## 2021-10-10 DIAGNOSIS — Z9221 Personal history of antineoplastic chemotherapy: Secondary | ICD-10-CM | POA: Diagnosis not present

## 2021-10-10 DIAGNOSIS — J439 Emphysema, unspecified: Secondary | ICD-10-CM | POA: Diagnosis not present

## 2021-10-10 DIAGNOSIS — M129 Arthropathy, unspecified: Secondary | ICD-10-CM | POA: Insufficient documentation

## 2021-10-10 LAB — CBC WITH DIFFERENTIAL (CANCER CENTER ONLY)
Abs Immature Granulocytes: 0.02 10*3/uL (ref 0.00–0.07)
Basophils Absolute: 0.1 10*3/uL (ref 0.0–0.1)
Basophils Relative: 2 %
Eosinophils Absolute: 0.1 10*3/uL (ref 0.0–0.5)
Eosinophils Relative: 2 %
HCT: 41.3 % (ref 36.0–46.0)
Hemoglobin: 13.9 g/dL (ref 12.0–15.0)
Immature Granulocytes: 0 %
Lymphocytes Relative: 36 %
Lymphs Abs: 2.5 10*3/uL (ref 0.7–4.0)
MCH: 32.3 pg (ref 26.0–34.0)
MCHC: 33.7 g/dL (ref 30.0–36.0)
MCV: 96 fL (ref 80.0–100.0)
Monocytes Absolute: 0.6 10*3/uL (ref 0.1–1.0)
Monocytes Relative: 8 %
Neutro Abs: 3.6 10*3/uL (ref 1.7–7.7)
Neutrophils Relative %: 52 %
Platelet Count: 440 10*3/uL — ABNORMAL HIGH (ref 150–400)
RBC: 4.3 MIL/uL (ref 3.87–5.11)
RDW: 13.1 % (ref 11.5–15.5)
WBC Count: 6.9 10*3/uL (ref 4.0–10.5)
nRBC: 0 % (ref 0.0–0.2)

## 2021-10-10 LAB — CMP (CANCER CENTER ONLY)
ALT: 12 U/L (ref 0–44)
AST: 19 U/L (ref 15–41)
Albumin: 4 g/dL (ref 3.5–5.0)
Alkaline Phosphatase: 92 U/L (ref 38–126)
Anion gap: 9 (ref 5–15)
BUN: 12 mg/dL (ref 8–23)
CO2: 26 mmol/L (ref 22–32)
Calcium: 9.9 mg/dL (ref 8.9–10.3)
Chloride: 104 mmol/L (ref 98–111)
Creatinine: 0.63 mg/dL (ref 0.44–1.00)
GFR, Estimated: >60 mL/min (ref >60)
Glucose, Bld: 88 mg/dL (ref 70–99)
Potassium: 3.9 mmol/L (ref 3.5–5.1)
Sodium: 139 mmol/L (ref 135–145)
Total Bilirubin: 0.5 mg/dL (ref 0.3–1.2)
Total Protein: 7.9 g/dL (ref 6.5–8.1)

## 2021-10-10 LAB — CEA (IN HOUSE-CHCC): CEA (CHCC-In House): 4.1 ng/mL (ref 0.00–5.00)

## 2021-10-10 NOTE — Progress Notes (Signed)
?Allison Dunlap   ?[phone] [phone]   ?Clinic Follow up Note  ? ?Patient Care Team: ?Billie Ruddy, MD as PCP - General (Family Medicine) ?Pyrtle, Lajuan Lines, MD as Consulting Physician (Gastroenterology) ?Leighton Ruff, MD as Consulting Physician (General Surgery) ?Kyung Rudd, MD as Consulting Physician (Radiation Oncology) ?Truitt Merle, MD as Consulting Physician (Hematology) ?Michael Boston, MD as Consulting Physician (General Surgery) ? ?Date of Service:  10/10/2021 ? ?CHIEF COMPLAINT: f/u of rectosigmoid cancer ? ?CURRENT THERAPY:  ?Surveillance ? ?ASSESSMENT & PLAN:  ?Allison Dunlap is a 71 y.o. female with  ? ?1. Rectosigmoid adenocarcinoma, cT3N1M0, stage IIIB, ypT2N0M0, MMR normal  ?-Diagnosed in 09/2016, s/p neoadjuvant concurrent chemoradiation, surgical resection, and adjuvant Xeloda ?-Surveillance colonoscopy 01/2018 showed diverticulosis and 1 benign polyp removed. She has not yet had repeat due to need for procedure to be done in the hospital because of prior diagnosis of difficult intubation. ?-Last surveillance CT 09/2019 was NED ?-she is clinically doing very well. Labs reviewed, overall WNL except plt 440k. Physical exam unremarkable. There is no clinical concern for recurrence. ?-she has completed 5 years of f/u now. I am comfortable releasing her to f/u with her PCP and GI. ?  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 ?PLAN: ?-She has completed 5 years of colon cancer surveillance, I will see her as needed in future. ? ? ?No problem-specific Assessment & Plan notes found for this encounter. ? ? ?SUMMARY OF ONCOLOGIC HISTORY: ?Oncology History Overview Note  ?Cancer Staging ?Rectal cancer ([place]) ?Staging form: Colon and Rectum, AJCC 8th Edition ?- Clinical stage from 10/14/2016: Stage IIIB (cT3, cN1, cM0) - Signed by Truitt Merle, MD on 10/24/2016 ? ? ?  ?Rectal cancer ([place])  ?10/14/2016 Initial Diagnosis  ? Rectal cancer [facility]) ?  ?10/14/2016 Procedure  ? Colonoscopy  by Dr. Hilarie Fredrickson showed a fungating partially upchucking large mass in the rectosigmoid colon, the mass begins about 10 cm from the dentate line and measured 5 cm in length. Biopsy was obtained. Multiple small and largemouth diverticula were found in the sigmoid colon and the ascending colon. ?  ?10/14/2016 Initial Biopsy  ? Rectosigmoid mass biopsy showed adenocarcinoma ? ?  ?10/15/2016 Imaging  ? CT of chest, abdomen and pelvis with contrast showed a 4 cm annular colonic soft tissue mass near the rectosigmoid junction, consistent with known primary carcinoma. Several small her chronic lymph nodes measuring up to 85m, suspicious for local lymph node metastasis. No evidence of distant metastasis. ?  ?11/17/2016 - 12/25/2016 Radiation Therapy  ?  ?Radiation Therapy by Dr. MLisbeth Renshaw?  ?11/17/2016 - 12/08/2016 Chemotherapy  ? Concurrent chemo Xeloda (15026min am and 100040mn evening) to go along with radiation.  ? ?Hold chemo due to platelet count 12/08/16 ?  ?02/18/2017 Surgery  ? XI ROBOTIC ASSISTED LOWER ANTERIOR RESECTION WITH SPLENIC FLEXURE MOBILIZATION and INTRAOPERATIVE ASSESSMENT OF VASCULAR PERFUSION by Dr. ThoMarcello Moores ?02/18/2017 Pathology Results  ? Diagnosis 02/18/17 ?1. Spleen ?- BENIGN SPLEEN (152 G) ?- NO MALIGNANCY IDENTIFIED ?2. Colon, segmental resection for tumor, rectosigmoid ?- RESIDUAL ADENOCARCINOMA, MODERATELY DIFFERENTIATED ?- MARGINS UNINVOLVED BY CARCINOMA ?- NO CARCINOMA IDENTIFIED IN TEN LYMPH NODES (0/10) ?- SEE ONCOLOGY TABLE AND COMMENT BELOW ?3. Rectum, resection, distal stump ?- BENIGN COLONIC MUCOSA ?- NO CARCINOMA IDENTIFIED IN ONE LYMPH NODE (0/1) ?4. Colon, resection margin (donut), final distal ?- BENIGN COLONIC MUCOSA ?- NO CARCINOMA IDENTIFIED ? ?  ?03/23/2017 - 07/20/2017 Chemotherapy  ? Xeloda 1500m57m2h, 2 weeks on  and 1 weeks off, started on 03/23/17 and comeplted 6 cycles on 07/20/17 ? ?  ?08/05/2017 Imaging  ? CT CAP W Contrast  ?IMPRESSION: ?1. Interval resection of rectal mass without  evidence for metastatic disease in the chest, abdomen, or pelvis. ?2. 6 mm nodular opacity in the posterior right costophrenic sulcus. ?3. Stable hepatic cysts. ?  ?10/07/2019 Imaging  ? CT CAP W Contrast ?IMPRESSION: ?1. Surgical sutures at the rectosigmoid junction. No acute process ?or evidence of metastatic disease in the chest, abdomen, or pelvis. ?2. Probable pancreas divisum. ?3. Aortic Atherosclerosis (ICD10-I70.0). ?4.  Emphysema (ICD10-J43.9). ?  ?  ? ? ? ?INTERVAL HISTORY:  ?Allison Dunlap is here for a follow up of rectosigmoid cancer. She was last seen by NP Lacie on 01/10/21. She presents to the clinic alone. ?She reports she is doing well overall. She notes occasional diarrhea depending on what she eats.  ?  ?All other systems were reviewed with the patient and are negative. ? ?MEDICAL HISTORY:  ?Past Medical History:  ?Diagnosis Date  ? Arthritis   ? Blood in stool 2018  ? Clotting disorder ([place])   ? spleen removed  ? rectal ca 10/14/2016  ? rectal cancer-adenocarcinoma  ? Temporary low platelet count ([facility])   ? ? ?SURGICAL HISTORY: ?Past Surgical History:  ?Procedure Laterality Date  ? COLON SURGERY  2017  ? COLONOSCOPY  02/23/2018  ? with propofol Dr.Pyrtle  ? INTRAOPERATIVE CHOLANGIOGRAM  02/18/2017  ? Procedure: INTRAOPERATIVE ASSESSMENT OF VASCULAR PERFUSION;  Surgeon: Leighton Ruff, MD;  Location: [facility];  Service: General;;  ? LAPAROSCOPIC SPLENECTOMY  02/18/2017  ? Procedure: XI ROBOTIC ASSISTED SPLENECTOMY;  Surgeon: Michael Boston, MD;  Location: [facility];  Service: General;;  ? NO PAST SURGERIES    ? POLYPECTOMY    ? XI ROBOTIC ASSISTED LOWER ANTERIOR RESECTION N/A 02/18/2017  ? Procedure: XI ROBOTIC ASSISTED LOWER ANTERIOR RESECTION WITH SPLENIC FLEXURE MOBILIZATION;  Surgeon: Leighton Ruff, MD;  Location: [facility];  Service: General;  Laterality: N/A;  ? ? ?I have reviewed the social history and family history with the patient and they are unchanged from previous note. ? ?ALLERGIES:   has No Known Allergies. ? ?MEDICATIONS:  ?Current Outpatient Medications  ?Medication Sig Dispense Refill  ? BLACK PEPPER-TURMERIC PO Take by mouth.    ? CALCIUM PO Take by mouth.    ? cholecalciferol (VITAMIN D) 1000 units tablet Take 1 tablet by mouth daily.    ? ibuprofen (ADVIL,MOTRIN) 200 MG tablet Take 1-2 tablets (200-400 mg total) by mouth every 6 (six) hours as needed (for pain).    ? ?No current facility-administered medications for this visit.  ? ? ?PHYSICAL EXAMINATION: ?ECOG PERFORMANCE STATUS: 0 - Asymptomatic ? ?Vitals:  ? 10/10/21 1512  ?BP: (!) 149/87  ?Pulse: 71  ?Resp: 17  ?Temp: 98.4 ?F (36.9 ?C)  ?SpO2: 98%  ? ?Wt Readings from Last 3 Encounters:  ?10/10/21 114 lb 11.2 oz (52 kg)  ?09/16/21 115 lb (52.2 kg)  ?08/12/21 115 lb 9.6 oz (52.4 kg)  ?  ? ?GENERAL:alert, no distress and comfortable ?SKIN: skin color, texture, turgor are normal, no rashes or significant lesions ?EYES: normal, Conjunctiva are pink and non-injected, sclera clear  ?NECK: supple, thyroid normal size, non-tender, without nodularity ?LYMPH:  no palpable lymphadenopathy in the cervical, axillary  ?LUNGS: clear to auscultation and percussion with normal breathing effort ?HEART: regular rate & rhythm and no murmurs and no lower extremity edema ?ABDOMEN:abdomen soft, non-tender and normal bowel  sounds ?Musculoskeletal:no cyanosis of digits and no clubbing  ?NEURO: alert & oriented x 3 with fluent speech, no focal motor/sensory deficits ? ?LABORATORY DATA:  ?I have reviewed the data as listed ?CBC Latest Ref Rng & Units 10/10/2021 01/10/2021 04/13/2020  ?WBC 4.0 - 10.5 K/uL 6.9 7.9 8.6  ?Hemoglobin 12.0 - 15.0 g/dL 13.9 14.1 14.2  ?Hematocrit 36.0 - 46.0 % 41.3 42.3 44.0  ?Platelets 150 - 400 K/uL 440(H) 398 385  ? ? ? ?CMP Latest Ref Rng & Units 10/10/2021 01/10/2021 04/13/2020  ?Glucose 70 - 99 mg/dL 88 93 97  ?BUN 8 - 23 mg/dL 12 10 16   ?Creatinine 0.44 - 1.00 mg/dL 0.63 0.70 0.68  ?Sodium 135 - 145 mmol/L 139 140 138  ?Potassium 3.5  - 5.1 mmol/L 3.9 4.3 4.2  ?Chloride 98 - 111 mmol/L 104 107 107  ?CO2 22 - 32 mmol/L 26 24 24   ?Calcium 8.9 - 10.3 mg/dL 9.9 9.6 9.5  ?Total Protein 6.5 - 8.1 g/dL 7.9 7.6 7.5  ?Total Bilirubin 0.3 - 1

## 2021-10-11 ENCOUNTER — Encounter: Payer: Self-pay | Admitting: *Deleted

## 2021-10-11 ENCOUNTER — Other Ambulatory Visit: Payer: Self-pay | Admitting: Internal Medicine

## 2021-10-11 DIAGNOSIS — Z85038 Personal history of other malignant neoplasm of large intestine: Secondary | ICD-10-CM

## 2021-10-11 NOTE — Telephone Encounter (Signed)
I have spoken to patient to advise that we have availability to complete her colonoscopy at the hospital on 10/17/21 at 1115 am. Patient is agreeable to this date. I have sent updated instructions to her via mychart and she tells me she already has Golytely prep at home. She states she will call us back on Monday should she be unable to find transportation for her procedure or if she finds this date will not work. ? ?All orders placed in Epic for procedure. ?

## 2021-10-14 ENCOUNTER — Encounter (HOSPITAL_COMMUNITY): Payer: Self-pay | Admitting: Internal Medicine

## 2021-10-14 NOTE — Progress Notes (Signed)
Attempted to obtain medical history via telephone, unable to reach at this time. I left a voicemail to return pre surgical testing department's phone call.  

## 2021-10-17 ENCOUNTER — Ambulatory Visit (HOSPITAL_COMMUNITY)
Admission: RE | Admit: 2021-10-17 | Discharge: 2021-10-17 | Disposition: A | Discharge status: Home / Self Care | Payer: Medicare HMO | Attending: Internal Medicine | Admitting: Internal Medicine

## 2021-10-17 ENCOUNTER — Other Ambulatory Visit: Payer: Self-pay

## 2021-10-17 ENCOUNTER — Ambulatory Visit (HOSPITAL_COMMUNITY): Payer: Medicare HMO | Admitting: Certified Registered"

## 2021-10-17 ENCOUNTER — Encounter (HOSPITAL_COMMUNITY)
Admission: RE | Disposition: A | Discharge status: Home / Self Care | Payer: Self-pay | Source: Home / Self Care | Attending: Internal Medicine

## 2021-10-17 ENCOUNTER — Encounter (HOSPITAL_COMMUNITY): Payer: Self-pay | Admitting: Internal Medicine

## 2021-10-17 ENCOUNTER — Ambulatory Visit (HOSPITAL_BASED_OUTPATIENT_CLINIC_OR_DEPARTMENT_OTHER): Payer: Medicare HMO | Admitting: Certified Registered"

## 2021-10-17 DIAGNOSIS — K5790 Diverticulosis of intestine, part unspecified, without perforation or abscess without bleeding: Secondary | ICD-10-CM | POA: Diagnosis not present

## 2021-10-17 DIAGNOSIS — Z87891 Personal history of nicotine dependence: Secondary | ICD-10-CM | POA: Diagnosis not present

## 2021-10-17 DIAGNOSIS — Z85048 Personal history of other malignant neoplasm of rectum, rectosigmoid junction, and anus: Secondary | ICD-10-CM | POA: Diagnosis not present

## 2021-10-17 DIAGNOSIS — Z85038 Personal history of other malignant neoplasm of large intestine: Secondary | ICD-10-CM

## 2021-10-17 DIAGNOSIS — Z980 Intestinal bypass and anastomosis status: Secondary | ICD-10-CM | POA: Diagnosis not present

## 2021-10-17 DIAGNOSIS — M1990 Unspecified osteoarthritis, unspecified site: Secondary | ICD-10-CM

## 2021-10-17 DIAGNOSIS — Z1211 Encounter for screening for malignant neoplasm of colon: Secondary | ICD-10-CM | POA: Insufficient documentation

## 2021-10-17 DIAGNOSIS — K5730 Diverticulosis of large intestine without perforation or abscess without bleeding: Secondary | ICD-10-CM | POA: Diagnosis not present

## 2021-10-17 HISTORY — PX: COLONOSCOPY WITH PROPOFOL: SHX5780

## 2021-10-17 SURGERY — COLONOSCOPY WITH PROPOFOL
Anesthesia: Monitor Anesthesia Care

## 2021-10-17 MED ORDER — PROPOFOL 10 MG/ML IV BOLUS
INTRAVENOUS | Status: DC | PRN
  Administered 2021-10-17: 20 mg via INTRAVENOUS
  Administered 2021-10-17: 15 mg via INTRAVENOUS
  Administered 2021-10-17: 25 mg via INTRAVENOUS

## 2021-10-17 MED ORDER — SODIUM CHLORIDE 0.9 % IV SOLN: INTRAVENOUS | Status: DC

## 2021-10-17 MED ORDER — LACTATED RINGERS IV SOLN: INTRAVENOUS | Status: DC | PRN

## 2021-10-17 MED ORDER — LIDOCAINE 2% (20 MG/ML) 5 ML SYRINGE
INTRAMUSCULAR | Status: DC | PRN
Start: 2021-10-17 — End: 2021-10-17
  Administered 2021-10-17: 60 mg via INTRAVENOUS

## 2021-10-17 MED ORDER — PROPOFOL 500 MG/50ML IV EMUL
INTRAVENOUS | Status: DC | PRN
  Administered 2021-10-17: 155 ug/kg/min via INTRAVENOUS

## 2021-10-17 MED ORDER — ONDANSETRON HCL 4 MG/2ML IJ SOLN: 4.0000 mg | Freq: Once | INTRAMUSCULAR | Status: DC | PRN

## 2021-10-17 MED ORDER — AMISULPRIDE (ANTIEMETIC) 5 MG/2ML IV SOLN
10.0000 mg | Freq: Once | INTRAVENOUS | Status: DC | PRN
  Filled 2021-10-17: qty 4

## 2021-10-17 SURGICAL SUPPLY — 22 items

## 2021-10-17 NOTE — Discharge Instructions (Signed)
YOU HAD AN ENDOSCOPIC PROCEDURE TODAY: Refer to the procedure report and other information in the discharge instructions given to you for any specific questions about what was found during the examination. If this information does not answer your questions, please call [place] office at [phone] to clarify.  ° °YOU SHOULD EXPECT: Some feelings of bloating in the abdomen. Passage of more gas than usual. Walking can help get rid of the air that was put into your GI tract during the procedure and reduce the bloating. If you had a lower endoscopy (such as a colonoscopy or flexible sigmoidoscopy) you may notice spotting of blood in your stool or on the toilet paper. Some abdominal soreness may be present for a day or two, also. ° °DIET: Your first meal following the procedure should be a light meal and then it is ok to progress to your normal diet. A half-sandwich or bowl of soup is an example of a good first meal. Heavy or fried foods are harder to digest and may make you feel nauseous or bloated. Drink plenty of fluids but you should avoid alcoholic beverages for 24 hours. If you had a esophageal dilation, please see attached instructions for diet.   ° °ACTIVITY: Your care partner should take you home directly after the procedure. You should plan to take it easy, moving slowly for the rest of the day. You can resume normal activity the day after the procedure however YOU SHOULD NOT DRIVE, use power tools, machinery or perform tasks that involve climbing or major physical exertion for 24 hours (because of the sedation medicines used during the test).  ° °SYMPTOMS TO REPORT IMMEDIATELY: °A gastroenterologist can be reached at any hour. Please call [phone]  for any of the following symptoms:  °Following lower endoscopy (colonoscopy, flexible sigmoidoscopy) °Excessive amounts of blood in the stool  °Significant tenderness, worsening of abdominal pains  °Swelling of the abdomen that is new, acute  °Fever of 100° or  higher  °Following upper endoscopy (EGD, EUS, ERCP, esophageal dilation) °Vomiting of blood or coffee ground material  °New, significant abdominal pain  °New, significant chest pain or pain under the shoulder blades  °Painful or persistently difficult swallowing  °New shortness of breath  °Black, tarry-looking or red, bloody stools ° °FOLLOW UP:  °If any biopsies were taken you will be contacted by phone or by letter within the next 1-3 weeks. Call [phone]  if you have not heard about the biopsies in 3 weeks.  °Please also call with any specific questions about appointments or follow up tests. ° °

## 2021-10-17 NOTE — Anesthesia Postprocedure Evaluation (Signed)
Anesthesia Post Note ? ?Patient: Kierre Deines ? ?Procedure(s) Performed: COLONOSCOPY WITH PROPOFOL ? ?  ? ?Patient location during evaluation: PACU ?Anesthesia Type: MAC ?Level of consciousness: awake and alert ?Pain management: pain level controlled ?Vital Signs Assessment: post-procedure vital signs reviewed and stable ?Respiratory status: spontaneous breathing, nonlabored ventilation, respiratory function stable and patient connected to nasal cannula oxygen ?Cardiovascular status: stable and blood pressure returned to baseline ?Postop Assessment: no apparent nausea or vomiting ?Anesthetic complications: no ? ? ?No notable events documented. ? ?Last Vitals:  ?Vitals:  ? 10/17/21 1155 10/17/21 1205  ?BP: (!) 103/53 111/61  ?Pulse: 66 60  ?Resp: 16 17  ?Temp:    ?SpO2: 97% 100%  ?  ?Last Pain:  ?Vitals:  ? 10/17/21 1205  ?TempSrc:   ?PainSc: 0-No pain  ? ? ?  ?  ?  ?  ?  ?  ? ?Effie Berkshire ? ? ? ? ?

## 2021-10-17 NOTE — Transfer of Care (Signed)
Immediate Anesthesia Transfer of Care Note ? ?Patient: Amiah Frohlich ? ?Procedure(s) Performed: COLONOSCOPY WITH PROPOFOL ? ?Patient Location: PACU and Endoscopy Unit ? ?Anesthesia Type:MAC ? ?Level of Consciousness: awake and alert  ? ?Airway & Oxygen Therapy: Patient Spontanous Breathing and Patient connected to face mask oxygen ? ?Post-op Assessment: Report given to RN and Post -op Vital signs reviewed and stable ? ?Post vital signs: Reviewed and stable ? ?Last Vitals:  ?Vitals Value Taken Time  ?BP 97/57 10/17/21 1145  ?Temp 36.1 ?C 10/17/21 1145  ?Pulse 64 10/17/21 1146  ?Resp 20 10/17/21 1146  ?SpO2 84 % 10/17/21 1146  ?Vitals shown include unvalidated device data. ? ?Last Pain:  ?Vitals:  ? 10/17/21 1145  ?TempSrc: Tympanic  ?PainSc: 0-No pain  ?   ? ?  ? ?Complications: No notable events documented. ?

## 2021-10-17 NOTE — Op Note (Signed)
[facility] ?Patient Name: Allison Dunlap ?Procedure Date: 10/17/2021 ?MRN: [id number] ?Attending MD: Jerene Bears , MD ?Date of Birth: [birth year] ?CSN: [id number] ?Age: 71 ?Admit Type: Outpatient ?Procedure:                Colonoscopy ?Indications:              High risk colon cancer surveillance: Personal  ?                          history of colon cancer dx March 2018 s/p LAR July  ?                          2018, Last colonoscopy: July 2019 ?Providers:                Lajuan Lines. Hilarie Fredrickson, MD, Ladoris Gene, RN, Jacquelin Hawking  ?                          [place], Technician ?Referring MD:             Billie Ruddy ?Medicines:                Monitored Anesthesia Care ?Complications:            No immediate complications. ?Estimated Blood Loss:     Estimated blood loss: none. ?Procedure:                Pre-Anesthesia Assessment: ?                          - Prior to the procedure, a History and Physical  ?                          was performed, and patient medications and  ?                          allergies were reviewed. The patient's tolerance of  ?                          previous anesthesia was also reviewed. The risks  ?                          and benefits of the procedure and the sedation  ?                          options and risks were discussed with the patient.  ?                          All questions were answered, and informed consent  ?                          was obtained. Prior Anticoagulants: The patient has  ?                          taken no previous anticoagulant or antiplatelet  ?  agents. ASA Grade Assessment: II - A patient with  ?                          mild systemic disease. After reviewing the risks  ?                          and benefits, the patient was deemed in  ?                          satisfactory condition to undergo the procedure. ?                          After obtaining informed consent, the colonoscope  ?                          was  passed under direct vision. Throughout the  ?                          procedure, the patient's blood pressure, pulse, and  ?                          oxygen saturations were monitored continuously. The  ?                          [id number]-HQ190L ([id number]) Olympus colonoscope was  ?                          introduced through the anus and advanced to the  ?                          cecum, identified by appendiceal orifice and  ?                          ileocecal valve. The colonoscopy was performed  ?                          without difficulty. The patient tolerated the  ?                          procedure well. The quality of the bowel  ?                          preparation was good. The ileocecal valve,  ?                          appendiceal orifice, and rectum were photographed. ?Scope In: 11:20:05 AM ?Scope Out: 11:36:28 AM ?Scope Withdrawal Time: 0 hours 13 minutes 9 seconds  ?Total Procedure Duration: 0 hours 16 minutes 23 seconds  ?Findings: ?     The digital rectal exam was normal. ?     Multiple small-mouthed diverticula were found in the sigmoid colon and  ?     descending colon. ?     There was evidence of a prior end-to-end colo-colonic anastomosis in the  ?     distal sigmoid colon. This was  patent and was characterized by healthy  ?     appearing mucosa. There was segmental telangiectasias in the sigmoid  ?     likely related to prior radiation. ?     The retroflexed view of the distal rectum and anal verge was normal and  ?     showed no anal or rectal abnormalities. ?Impression:               - Diverticulosis in the sigmoid colon and in the  ?                          descending colon. ?                          - Patent end-to-end colo-colonic anastomosis,  ?                          characterized by healthy appearing mucosa with  ?                          scattered non-bleeding ectasias (likely secondary  ?                          to prior radiation). ?                          - The distal  rectum and anal verge are normal on  ?                          retroflexion view. ?                          - No specimens collected. ?Moderate Sedation: ?     N/A ?Recommendation:           - Patient has a contact number available for  ?                          emergencies. The signs and symptoms of potential  ?                          delayed complications were discussed with the  ?                          patient. Return to normal activities tomorrow.  ?                          Written discharge instructions were provided to the  ?                          patient. ?                          - Resume previous diet. ?                          - Continue present medications. ?                          -  Repeat colonoscopy in 5 years for surveillance. ?Procedure Code(s):        --- Professional --- ?                          G0105, Colorectal cancer screening; colonoscopy on  ?                          individual at high risk ?Diagnosis Code(s):        --- Professional --- ?                          T02.111, Personal history of other malignant  ?                          neoplasm of large intestine ?                          Z98.0, Intestinal bypass and anastomosis status ?                          K57.30, Diverticulosis of large intestine without  ?                          perforation or abscess without bleeding ?CPT copyright 2019 [employer]. All rights reserved. ?The codes documented in this report are preliminary and upon coder review may  ?be revised to meet current compliance requirements. ?Jerene Bears, MD ?10/17/2021 11:40:48 AM ?This report has been signed electronically. ?Number of Addenda: 0 ?

## 2021-10-17 NOTE — H&P (Signed)
? ?GASTROENTEROLOGY PROCEDURE H&P NOTE  ? ?Primary Care Physician: ?Billie Ruddy, MD ? ? ? ?Reason for Procedure:  Personal history of colon cancer ? ?Plan:    Surveillance colonoscopy ? ?Patient is appropriate for endoscopic procedure(s) in the outpatient hospital setting today. ? ?The nature of the procedure, as well as the risks, benefits, and alternatives were carefully and thoroughly reviewed with the patient. Ample time for discussion and questions allowed. The patient understood, was satisfied, and agreed to proceed.  ? ? ? ?HPI: ?Allison Dunlap is a 71 y.o. female who presents for surveillance colonoscopy.  Medical history as below.  Tolerated the prep.  No recent chest pain or shortness of breath.  No abdominal pain today. ? ?Past Medical History:  ?Diagnosis Date  ? Arthritis   ? Blood in stool 2018  ? Clotting disorder ([place])   ? spleen removed  ? rectal ca 10/14/2016  ? rectal cancer-adenocarcinoma  ? Temporary low platelet count ([facility])   ? ? ?Past Surgical History:  ?Procedure Laterality Date  ? COLON SURGERY  2017  ? COLONOSCOPY  02/23/2018  ? with propofol Dr.Jayel Inks  ? INTRAOPERATIVE CHOLANGIOGRAM  02/18/2017  ? Procedure: INTRAOPERATIVE ASSESSMENT OF VASCULAR PERFUSION;  Surgeon: Leighton Ruff, MD;  Location: [facility];  Service: General;;  ? LAPAROSCOPIC SPLENECTOMY  02/18/2017  ? Procedure: XI ROBOTIC ASSISTED SPLENECTOMY;  Surgeon: Michael Boston, MD;  Location: [facility];  Service: General;;  ? NO PAST SURGERIES    ? POLYPECTOMY    ? XI ROBOTIC ASSISTED LOWER ANTERIOR RESECTION N/A 02/18/2017  ? Procedure: XI ROBOTIC ASSISTED LOWER ANTERIOR RESECTION WITH SPLENIC FLEXURE MOBILIZATION;  Surgeon: Leighton Ruff, MD;  Location: [facility];  Service: General;  Laterality: N/A;  ? ? ?Prior to Admission medications   ?Medication Sig Start Date End Date Taking? Authorizing Provider  ?BLACK PEPPER-TURMERIC PO Take 1 capsule by mouth daily with breakfast.   Yes [provider]  ?Calcium  Carb-Cholecalciferol (CALCIUM+D3 PO) Take 1 tablet by mouth in the morning.   Yes [provider]  ?cholecalciferol (VITAMIN D) 1000 units tablet Take 1 tablet by mouth daily.   Yes [provider]  ?polyethylene glycol-electrolytes (NULYTELY) 420 g solution Take 4,000 mLs by mouth once. 09/16/21  Yes [provider]  ?ibuprofen (ADVIL) 200 MG tablet Take 200 mg by mouth daily as needed (pan.).    [provider]  ? ? ?No current facility-administered medications for this encounter.  ? ? ?Allergies as of 10/13/2021  ? (No Known Allergies)  ? ? ?Family History  ?Problem Relation Age of Onset  ? Alzheimer's disease Mother   ? Liver disease Father 76  ?     failure  ? Breast cancer Sister   ? Cancer Sister 56  ?     breast cancer   ? Colon polyps Sister   ? Colon polyps Brother   ? Heart disease Maternal Grandfather   ? Colon cancer Paternal Grandmother   ? Cancer Paternal Grandmother   ?     type unknown  ? Rectal cancer Paternal Grandmother   ? Esophageal cancer Neg Hx   ? Stomach cancer Neg Hx   ? ? ?Social History  ? ?Socioeconomic History  ? Marital status: Widowed  ?  Spouse name: Not on file  ? Number of children: 0  ? Years of education: Not on file  ? Highest education level: Not on file  ?Occupational History  ? Occupation: retired  ?Tobacco Use  ?  Smoking status: Former  ?  Packs/day: 0.50  ?  Years: 30.00  ?  Pack years: 15.00  ?  Types: Cigarettes  ?  Quit date: 07/28/1997  ?  Years since quitting: 24.2  ? Smokeless tobacco: Former  ?Vaping Use  ? Vaping Use: Never used  ?Substance and Sexual Activity  ? Alcohol use: Yes  ?  Alcohol/week: 7.0 standard drinks  ?  Types: 7 Cans of beer per week  ? Drug use: No  ? Sexual activity: Not on file  ?Other Topics Concern  ? Not on file  ?Social History Narrative  ? Not on file  ? ?Social Determinants of Health  ? ?Financial Resource Strain: Low Risk   ? Difficulty of Paying Living Expenses: Not hard at all  ?Food Insecurity: No Food  Insecurity  ? Worried About Charity fundraiser in the Last Year: Never true  ? Ran Out of Food in the Last Year: Never true  ?Transportation Needs: No Transportation Needs  ? Lack of Transportation (Medical): No  ? Lack of Transportation (Non-Medical): No  ?Physical Activity: Sufficiently Active  ? Days of Exercise per Week: 5 days  ? Minutes of Exercise per Session: 30 min  ?Stress: No Stress Concern Present  ? Feeling of Stress : Not at all  ?Social Connections: Moderately Isolated  ? Frequency of Communication with Friends and Family: More than three times a week  ? Frequency of Social Gatherings with Friends and Family: More than three times a week  ? Attends Religious Services: Never  ? Active Member of Clubs or Organizations: Yes  ? Attends Archivist Meetings: 1 to 4 times per year  ? Marital Status: Widowed  ?Intimate Partner Violence: Not At Risk  ? Fear of Current or Ex-Partner: No  ? Emotionally Abused: No  ? Physically Abused: No  ? Sexually Abused: No  ? ? ?Physical Exam: ?Vital signs in last 24 hours: ?@BP  137/76   Pulse 75   Temp 98.3 ?F (36.8 ?C) (Oral)   Resp 14   Ht 4' 11"  (1.499 m)   Wt 52.6 kg   SpO2 100%   BMI 23.43 kg/m?  ?GEN: NAD ?EYE: Sclerae anicteric ?ENT: MMM ?CV: Non-tachycardic ?Pulm: CTA b/l ?GI: Soft, NT/ND ?NEURO:  Alert & Oriented x 3 ? ? ?Zenovia Jarred, MD ?[facility] ? ?10/17/2021 10:56 AM ? ? ?

## 2021-10-17 NOTE — Anesthesia Preprocedure Evaluation (Addendum)
Anesthesia Evaluation  ?Patient identified by MRN, date of birth, ID band ?Patient awake ? ? ? ?Reviewed: ?Allergy & Precautions, NPO status , Patient's Chart, lab work & pertinent test results ? ?Airway ?Mallampati: II ? ?TM Distance: >3 FB ?Neck ROM: Full ? ? ? Dental ? ?(+) Teeth Intact, Dental Advisory Given ?  ?Pulmonary ?former smoker,  ?  ?breath sounds clear to auscultation ? ? ? ? ? ? Cardiovascular ?negative cardio ROS ? ? ?Rhythm:Regular Rate:Normal ? ? ?  ?Neuro/Psych ?negative neurological ROS ? negative psych ROS  ? GI/Hepatic ?negative GI ROS, Neg liver ROS,   ?Endo/Other  ?negative endocrine ROS ? Renal/GU ?negative Renal ROS  ? ?  ?Musculoskeletal ? ?(+) Arthritis ,  ? Abdominal ?Normal abdominal exam  (+)   ?Peds ? Hematology ?negative hematology ROS ?(+)   ?Anesthesia Other Findings ? ? Reproductive/Obstetrics ? ?  ? ? ? ? ? ? ? ? ? ? ? ? ? ?  ?  ? ? ? ? ? ? ? ?Anesthesia Physical ?Anesthesia Plan ? ?ASA: 2 ? ?Anesthesia Plan: MAC  ? ?Post-op Pain Management:   ? ?Induction: Intravenous ? ?PONV Risk Score and Plan: 0 and Propofol infusion ? ?Airway Management Planned: Natural Airway and Simple Face Mask ? ?Additional Equipment: None ? ?Intra-op Plan:  ? ?Post-operative Plan:  ? ?Informed Consent: I have reviewed the patients History and Physical, chart, labs and discussed the procedure including the risks, benefits and alternatives for the proposed anesthesia with the patient or authorized representative who has indicated his/her understanding and acceptance.  ? ? ? ? ? ?Plan Discussed with: CRNA ? ?Anesthesia Plan Comments:   ? ? ? ? ? ?Anesthesia Quick Evaluation ? ?

## 2021-10-18 ENCOUNTER — Encounter (HOSPITAL_COMMUNITY): Payer: Self-pay | Admitting: Internal Medicine

## 2021-12-04 ENCOUNTER — Telehealth: Payer: Self-pay

## 2021-12-04 NOTE — Telephone Encounter (Signed)
LVM instructions for pt to schedule CPE  in June. ?

## 2022-01-22 DIAGNOSIS — H524 Presbyopia: Secondary | ICD-10-CM | POA: Diagnosis not present

## 2022-01-22 DIAGNOSIS — H52223 Regular astigmatism, bilateral: Secondary | ICD-10-CM | POA: Diagnosis not present

## 2022-01-23 ENCOUNTER — Telehealth: Payer: Self-pay | Admitting: Family Medicine

## 2022-01-23 NOTE — Telephone Encounter (Signed)
Left message for patient to call back and schedule Medicare Annual Wellness Visit (AWV) either virtually or in office. Left  my [name] number [phone]   Last AWV ;01/15/21  please schedule at anytime with [facility] Nurse Health Advisor 1 or 2

## 2022-01-29 ENCOUNTER — Ambulatory Visit (INDEPENDENT_AMBULATORY_CARE_PROVIDER_SITE_OTHER): Payer: Medicare HMO

## 2022-01-29 VITALS — Ht 59.0 in | Wt 114.0 lb

## 2022-01-29 DIAGNOSIS — Z0000 Encounter for general adult medical examination without abnormal findings: Secondary | ICD-10-CM

## 2022-01-29 NOTE — Progress Notes (Signed)
Subjective:   Allison Dunlap is a 71 y.o. female who presents for Medicare Annual (Subsequent) preventive examination.  Review of Systems    Virtual Visit via Telephone Note  I connected with  Allison Dunlap on 01/29/22 at  8:15 AM EDT by telephone and verified that I am speaking with the correct person using two identifiers.  Location: Patient: Home Provider: Office Persons participating in the virtual visit: patient/Nurse Health Advisor   I discussed the limitations, risks, security and privacy concerns of performing an evaluation and management service by telephone and the availability of in person appointments. The patient expressed understanding and agreed to proceed.  Interactive audio and video telecommunications were attempted between this nurse and patient, however failed, due to patient having technical difficulties OR patient did not have access to video capability.  We continued and completed visit with audio only.  Some vital signs may be absent or patient reported.   Allison Peaches, LPN  Cardiac Risk Factors include: advanced age (>44mn, >>63women)     Objective:    Today's Vitals   01/29/22 0817  Weight: 114 lb (51.7 kg)   Body mass index is 23.03 kg/m.     01/29/2022    8:25 AM 10/17/2021   10:10 AM 01/15/2021    1:16 PM 10/12/2019    1:38 PM 08/07/2017    1:45 PM 06/11/2017    2:04 PM 04/30/2017   11:51 AM  Advanced Directives  Does Patient Have a Medical Advance Directive? Yes Yes Yes Yes Yes Yes Yes  Type of [place]  Healthcare Power of [place] will   Does patient want to make changes to medical advance directive? No - Patient declined        Copy of [place]? Yes - validated most recent copy scanned in chart (See row information)  Yes - validated most recent copy scanned in chart (See row information)   No - copy  requested     Current Medications (verified) Outpatient Encounter Medications as of 01/29/2022  Medication Sig   BLACK PEPPER-TURMERIC PO Take 1 capsule by mouth daily with breakfast.   Calcium Carb-Cholecalciferol (CALCIUM+D3 PO) Take 1 tablet by mouth in the morning.   cholecalciferol (VITAMIN D) 1000 units tablet Take 1 tablet by mouth daily.   ibuprofen (ADVIL) 200 MG tablet Take 200 mg by mouth daily as needed (pan.).   No facility-administered encounter medications on file as of 01/29/2022.    Allergies (verified) Patient has no known allergies.   History: Past Medical History:  Diagnosis Date   Arthritis    Blood in stool 2018   [place]    spleen removed   rectal ca 10/14/2016   rectal cancer-adenocarcinoma   Temporary low platelet count ([facility]    Past Surgical History:  Procedure Laterality Date   COLON SURGERY  2017   COLONOSCOPY  02/23/2018   with propofol Dr.Pyrtle   COLONOSCOPY WITH PROPOFOL N/A 10/17/2021   Procedure: COLONOSCOPY WITH PROPOFOL;  Surgeon: Allison Dunlap;  [facility];  Service: Gastroenterology;  Laterality: N/A;   INTRAOPERATIVE CHOLANGIOGRAM  02/18/2017   Procedure: INTRAOPERATIVE ASSESSMENT OF VASCULAR PERFUSION;  Surgeon: Allison Dunlap;  [facility];  Service: General;;   LAPAROSCOPIC SPLENECTOMY  02/18/2017   Procedure: XI ROBOTIC ASSISTED SPLENECTOMY;  Surgeon: Allison Dunlap;  [facility];  Service: General;;   NO  PAST SURGERIES     POLYPECTOMY     XI ROBOTIC ASSISTED LOWER ANTERIOR RESECTION N/A 02/18/2017   Procedure: XI ROBOTIC ASSISTED LOWER ANTERIOR RESECTION WITH SPLENIC FLEXURE MOBILIZATION;  Surgeon: Leighton Ruff, Dun[place];  Location[facility];  Service: General;  Laterality: N/A;   Family History  Problem Relation Age of Onset   Alzheimer's disease Mother    Liver disease Father 33       failure   Breast cancer Sister    Cancer Sister 73       breast cancer    Colon polyps Sister     Colon polyps Brother    Heart disease Maternal Grandfather    Colon cancer Paternal Grandmother    Cancer Paternal Grandmother        type unknown   Rectal cancer Paternal Grandmother    Esophageal cancer Neg Hx    Stomach cancer Neg Hx    Social History   Socioeconomic History   Marital status: Widowed    Spouse name: Not on file   Number of children: 0   Years of education: Not on file   Highest education level: Not on file  Occupational History   Occupation: retired  Tobacco Use   Smoking status: Former    Packs/day: 0.50    Years: 30.00    Total pack years: 15.00    Types: Cigarettes    Quit date: 07/28/1997    Years since quitting: 24.5   Smokeless tobacco: Former  Scientific laboratory technician Use: Never used  Substance and Sexual Activity   Alcohol use: Yes    Alcohol/week: 7.0 standard drinks of alcohol    Types: 7 Cans of beer per week   Drug use: No   Sexual activity: Not on file  Other Topics Concern   Not on file  Social History Narrative   Not on file   Social Determinants of Health   Financial Resource Strain: Low Risk  (01/29/2022)   Overall Financial Resource Strain (CARDIA)    Difficulty of Paying Living Expenses: Not hard at all  Food Insecurity: No Food Insecurity (01/29/2022)   Hunger Vital Sign    Worried About Running Out of Food in the Last Year: Never true    Ran Out of Food in the Last Year: Never true  Transportation Needs: No Transportation Needs (01/29/2022)   PRAPARE - Hydrologist (Medical): No    Lack of Transportation (Non-Medical): No  Physical Activity: Sufficiently Active (01/29/2022)   Exercise Vital Sign    Days of Exercise per Week: 7 days    Minutes of Exercise per Session: 30 min  Stress: No Stress Concern Present (01/29/2022)   [place]    Feeling of Stress : Not at all  Social Connections: Socially Isolated (01/29/2022)   Social Connection and  Isolation Panel [NHANES]    Frequency of Communication with Friends and Family: More than three times a week    Frequency of Social Gatherings with Friends and Family: More than three times a week    Attends Religious Services: Never    Marine scientist or Organizations: Not on file    Attends Archivist Meetings: Never    Marital Status: Widowed    Tobacco Counseling Counseling given: Not Answered   Clinical Intake:  Pre-visit preparation completed: No  Diabetic?  No     Activities of Daily Living  01/29/2022    8:24 AM  In your present state of health, do you have any difficulty performing the following activities:  Hearing? 0  Vision? 0  Difficulty concentrating or making decisions? 0  Walking or climbing stairs? 0  Dressing or bathing? 0  Doing errands, shopping? 0  Preparing Food and eating ? N  Using the Toilet? N  In the past six months, have you accidently leaked urine? N  Do you have problems with loss of bowel control? N  Managing your Medications? N  Managing your Finances? N  Housekeeping or managing your Housekeeping? N    Patient Care Team: Billie Ruddy, Dunlap as PCP - General (Family Medicine) Pyrtle, Lajuan Lines, Dunlap as Consulting Physician (Gastroenterology) Leighton Ruff, Dunlap as Consulting Physician (General Surgery) Kyung Rudd, Dunlap as Consulting Physician (Radiation Oncology) Truitt Merle, Dunlap as Consulting Physician (Hematology) Michael Boston, Dunlap as Consulting Physician (General Surgery)  Indicate any recent Medical Services you may have received from other than [facility] providers in the past year (date may be approximate).     Assessment:   This is a routine wellness examination for [place].  Hearing/Vision screen Hearing Screening - Comments:: No hearing difficulty Vision Screening - Comments:: Wears glasses. Followed by Dr Jabier Mutton  Dietary issues and exercise activities discussed: Exercise limited by: None identified   Goals Addressed                This Visit's Progress     Patient Stated (pt-stated)        Maintain healthy state.       Depression Screen    01/29/2022    8:22 AM 08/12/2021   11:49 AM 01/15/2021    1:13 PM 01/12/2017    1:27 PM  PHQ 2/9 Scores  PHQ - 2 Score 0 0 0 0  PHQ- 9 Score  1      Fall Risk    01/29/2022    8:25 AM 08/12/2021   11:49 AM 01/15/2021    1:17 PM 01/12/2017    1:27 PM 11/03/2016   10:44 AM  Fall Risk   Falls in the past year? 0 0 0 No No  Number falls in past yr: 0  0    Injury with Fall? 0  0    Risk for fall due to : No Fall Risks  Impaired vision    Follow up   Falls prevention discussed      [place]:  Any stairs in or around the home? Yes  If so, are there any without handrails? No  Home free of loose throw rugs in walkways, pet beds, electrical cords, etc? Yes  Adequate lighting in your home to reduce risk of falls? Yes   ASSISTIVE DEVICES UTILIZED TO PREVENT FALLS:  Life alert? No  Use of a cane, walker or w/c? No  Grab bars in the bathroom? Yes  Shower chair or bench in shower? No  Elevated toilet seat or a handicapped toilet? No   TIMED UP AND GO:  Was the test performed? No . Audio Visit  Cognitive Function:        01/29/2022    8:25 AM 01/15/2021    1:19 PM  6CIT Screen  What Year? 0 points 0 points  What month? 0 points 0 points  What time? 0 points 0 points  Count back from 20 0 points 0 points  Months in reverse 0 points 0 points  Repeat phrase 0  points 0 points  Total Score 0 points 0 points    Immunizations Immunization History  Administered Date(s) Administered   HiB (PRP-T) 01/22/2017   Influenza, High Dose Seasonal PF 04/24/2019   Meningococcal Conjugate 01/26/2017   Moderna Sars-Covid-2 Vaccination 08/26/2019, 09/23/2019, 06/16/2020, 01/03/2021, 06/27/2021   Pneumococcal Conjugate-13 01/21/2017   Pneumococcal Polysaccharide-23 01/17/2021    TDAP status: Due, Education has been provided  regarding the importance of this vaccine. Advised may receive this vaccine at local pharmacy or Health Dept. Aware to provide a copy of the vaccination record if obtained from local pharmacy or Health Dept. Verbalized acceptance and understanding.  Flu Vaccine status: Declined, Education has been provided regarding the importance of this vaccine but patient still declined. Advised may receive this vaccine at local pharmacy or Health Dept. Aware to provide a copy of the vaccination record if obtained from local pharmacy or Health Dept. Verbalized acceptance and understanding.  Pneumococcal vaccine status: Up to date  Covid-19 vaccine status: Completed vaccines  Qualifies for Shingles Vaccine? Yes   Zostavax completed No   Shingrix Completed?: No.    Education has been provided regarding the importance of this vaccine. Patient has been advised to call insurance company to determine out of pocket expense if they have not yet received this vaccine. Advised may also receive vaccine at local pharmacy or Health Dept. Verbalized acceptance and understanding.  Screening Tests Health Maintenance  Topic Date Due   COVID-19 Vaccine (6 - Booster for Moderna series) 02/14/2022 (Originally 08/22/2021)   Zoster Vaccines- Shingrix (1 of 2) 05/01/2022 (Originally 05/02/1970)   TETANUS/TDAP  01/30/2023 (Originally 05/02/1970)   INFLUENZA VACCINE  02/25/2022   MAMMOGRAM  05/11/2022   COLONOSCOPY (Pts 45-67yr Insurance coverage will need to be confirmed)  10/17/2024   Pneumonia Vaccine 72 Years old  Completed   DEXA SCAN  Completed   Hepatitis C Screening  Completed   HPV VACCINES  Aged Out    Health Maintenance  There are no preventive care reminders to display for this patient.   Colorectal cancer screening: Type of screening: Colonoscopy. Completed 10/17/21. Repeat every 3 years    Bone Density status: Completed 02/19/21. Results reflect: Bone density results: OSTEOPOROSIS. Repeat every    years.  Lung Cancer Screening: (Low Dose CT Chest recommended if Age 72-80years, 30 pack-year currently smoking OR have quit w/in 15years.) does not qualify.     Additional Screening:  Hepatitis C Screening: does qualify; Completed 01/17/21  Vision Screening: Recommended annual ophthalmology exams for early detection of glaucoma and other disorders of the eye. Is the patient up to date with their annual eye exam?  Yes  Who is the provider or what is the name of the office in which the patient attends annual eye exams? Dr RJabier MuttonIf pt is not established with a provider, would they like to be referred to a provider to establish care? No .   Dental Screening: Recommended annual dental exams for proper oral hygiene  Community Resource Referral / Chronic Care Management:  CRR required this visit?  No   CCM required this visit?  No      Plan:     I have personally reviewed and noted the following in the patient's chart:   Medical and social history Use of alcohol, tobacco or illicit drugs  Current medications and supplements including opioid prescriptions.  Functional ability and status Nutritional status Physical activity Advanced directives List of other physicians Hospitalizations, surgeries, and ER visits in previous 12 months  Vitals Screenings to include cognitive, depression, and falls Referrals and appointments  In addition, I have reviewed and discussed with patient certain preventive protocols, quality metrics, and best practice recommendations. A written personalized care plan for preventive services as well as general preventive health recommendations were provided to patient.     Allison Peaches, LPN   0/0/1239   Nurse Notes: None

## 2022-01-29 NOTE — Patient Instructions (Addendum)
Allison Dunlap , Thank you for taking time to come for your Medicare Wellness Visit. I appreciate your ongoing commitment to your health goals. Please review the following plan we discussed and let me know if I can assist you in the future.   These are the goals we discussed:  Goals       Patient Stated (pt-stated)      Maintain healthy state.        This is a list of the screening recommended for you and due dates:  Health Maintenance  Topic Date Due   COVID-19 Vaccine (6 - Booster for Moderna series) 02/14/2022*   Zoster (Shingles) Vaccine (1 of 2) 05/01/2022*   Tetanus Vaccine  01/30/2023*   Flu Shot  02/25/2022   Mammogram  05/11/2022   Colon Cancer Screening  10/17/2024   Pneumonia Vaccine  Completed   DEXA scan (bone density measurement)  Completed   Hepatitis C Screening: USPSTF Recommendation to screen - Ages 77-79 yo.  Completed   HPV Vaccine  Aged Out  *Topic was postponed. The date shown is not the original due date.   Advanced directives: Yes  Conditions/risks identified: None  Next appointment: Follow up in one year for your annual wellness visit     Preventive Care 65 Years and Older, Female Preventive care refers to lifestyle choices and visits with your health care provider that can promote health and wellness. What does preventive care include? A yearly physical exam. This is also called an annual well check. Dental exams once or twice a year. Routine eye exams. Ask your health care provider how often you should have your eyes checked. Personal lifestyle choices, including: Daily care of your teeth and gums. Regular physical activity. Eating a healthy diet. Avoiding tobacco and drug use. Limiting alcohol use. Practicing safe sex. Taking low-dose aspirin every day. Taking vitamin and mineral supplements as recommended by your health care provider. What happens during an annual well check? The services and screenings done by your health care provider  during your annual well check will depend on your age, overall health, lifestyle risk factors, and family history of disease. Counseling  Your health care provider may ask you questions about your: Alcohol use. Tobacco use. Drug use. Emotional well-being. Home and relationship well-being. Sexual activity. Eating habits. History of falls. Memory and ability to understand (cognition). Work and work Statistician. Reproductive health. Screening  You may have the following tests or measurements: Height, weight, and BMI. Blood pressure. Lipid and cholesterol levels. These may be checked every 5 years, or more frequently if you are over 25 years old. Skin check. Lung cancer screening. You may have this screening every year starting at age 50 if you have a 30-pack-year history of smoking and currently smoke or have quit within the past 15 years. Fecal occult blood test (FOBT) of the stool. You may have this test every year starting at age 90. Flexible sigmoidoscopy or colonoscopy. You may have a sigmoidoscopy every 5 years or a colonoscopy every 10 years starting at age 59. Hepatitis C blood test. Hepatitis B blood test. Sexually transmitted disease (STD) testing. Diabetes screening. This is done by checking your blood sugar (glucose) after you have not eaten for a while (fasting). You may have this done every 1-3 years. Bone density scan. This is done to screen for osteoporosis. You may have this done starting at age 27. Mammogram. This may be done every 1-2 years. Talk to your health care provider about how often  you should have regular mammograms. Talk with your health care provider about your test results, treatment options, and if necessary, the need for more tests. Vaccines  Your health care provider may recommend certain vaccines, such as: Influenza vaccine. This is recommended every year. Tetanus, diphtheria, and acellular pertussis (Tdap, Td) vaccine. You may need a Td booster every  10 years. Zoster vaccine. You may need this after age 65. Pneumococcal 13-valent conjugate (PCV13) vaccine. One dose is recommended after age 71. Pneumococcal polysaccharide (PPSV23) vaccine. One dose is recommended after age 53. Talk to your health care provider about which screenings and vaccines you need and how often you need them. This information is not intended to replace advice given to you by your health care provider. Make sure you discuss any questions you have with your health care provider. Document Released: 08/10/2015 Document Revised: 04/02/2016 Document Reviewed: 05/15/2015 Elsevier Interactive Patient Education  2017 [place] Prevention in the Home Falls can cause injuries. They can happen to people of all ages. There are many things you can do to make your home safe and to help prevent falls. What can I do on the outside of my home? Regularly fix the edges of walkways and driveways and fix any cracks. Remove anything that might make you trip as you walk through a door, such as a raised step or threshold. Trim any bushes or trees on the path to your home. Use bright outdoor lighting. Clear any walking paths of anything that might make someone trip, such as rocks or tools. Regularly check to see if handrails are loose or broken. Make sure that both sides of any steps have handrails. Any raised decks and porches should have guardrails on the edges. Have any leaves, snow, or ice cleared regularly. Use sand or salt on walking paths during winter. Clean up any spills in your garage right away. This includes oil or grease spills. What can I do in the bathroom? Use night lights. Install grab bars by the toilet and in the tub and shower. Do not use towel bars as grab bars. Use non-skid mats or decals in the tub or shower. If you need to sit down in the shower, use a plastic, non-slip stool. Keep the floor dry. Clean up any water that spills on the floor as soon as it  happens. Remove soap buildup in the tub or shower regularly. Attach bath mats securely with double-sided non-slip rug tape. Do not have throw rugs and other things on the floor that can make you trip. What can I do in the bedroom? Use night lights. Make sure that you have a light by your bed that is easy to reach. Do not use any sheets or blankets that are too big for your bed. They should not hang down onto the floor. Have a firm chair that has side arms. You can use this for support while you get dressed. Do not have throw rugs and other things on the floor that can make you trip. What can I do in the kitchen? Clean up any spills right away. Avoid walking on wet floors. Keep items that you use a lot in easy-to-reach places. If you need to reach something above you, use a strong step stool that has a grab bar. Keep electrical cords out of the way. Do not use floor polish or wax that makes floors slippery. If you must use wax, use non-skid floor wax. Do not have throw rugs and other things on  the floor that can make you trip. What can I do with my stairs? Do not leave any items on the stairs. Make sure that there are handrails on both sides of the stairs and use them. Fix handrails that are broken or loose. Make sure that handrails are as long as the stairways. Check any carpeting to make sure that it is firmly attached to the stairs. Fix any carpet that is loose or worn. Avoid having throw rugs at the top or bottom of the stairs. If you do have throw rugs, attach them to the floor with carpet tape. Make sure that you have a light switch at the top of the stairs and the bottom of the stairs. If you do not have them, ask someone to add them for you. What else can I do to help prevent falls? Wear shoes that: Do not have high heels. Have rubber bottoms. Are comfortable and fit you well. Are closed at the toe. Do not wear sandals. If you use a stepladder: Make sure that it is fully opened.  Do not climb a closed stepladder. Make sure that both sides of the stepladder are locked into place. Ask someone to hold it for you, if possible. Clearly mark and make sure that you can see: Any grab bars or handrails. First and last steps. Where the edge of each step is. Use tools that help you move around (mobility aids) if they are needed. These include: Canes. Walkers. Scooters. Crutches. Turn on the lights when you go into a dark area. Replace any light bulbs as soon as they burn out. Set up your furniture so you have a clear path. Avoid moving your furniture around. If any of your floors are uneven, fix them. If there are any pets around you, be aware of where they are. Review your medicines with your doctor. Some medicines can make you feel dizzy. This can increase your chance of falling. Ask your doctor what other things that you can do to help prevent falls. This information is not intended to replace advice given to you by your health care provider. Make sure you discuss any questions you have with your health care provider. Document Released: 05/10/2009 Document Revised: 12/20/2015 Document Reviewed: 08/18/2014 Elsevier Interactive Patient Education  2017 [employer].

## 2022-02-04 ENCOUNTER — Ambulatory Visit: Payer: Medicare HMO

## 2022-02-20 ENCOUNTER — Encounter: Payer: Self-pay | Admitting: Family Medicine

## 2022-02-20 ENCOUNTER — Ambulatory Visit (INDEPENDENT_AMBULATORY_CARE_PROVIDER_SITE_OTHER): Payer: Medicare HMO | Admitting: Family Medicine

## 2022-02-20 VITALS — BP 124/64 | HR 64 | Temp 98.5°F | Ht 60.5 in | Wt 115.8 lb

## 2022-02-20 DIAGNOSIS — Z85048 Personal history of other malignant neoplasm of rectum, rectosigmoid junction, and anus: Secondary | ICD-10-CM

## 2022-02-20 DIAGNOSIS — I700 Atherosclerosis of aorta: Secondary | ICD-10-CM

## 2022-02-20 DIAGNOSIS — D6959 Other secondary thrombocytopenia: Secondary | ICD-10-CM

## 2022-02-20 DIAGNOSIS — Z0000 Encounter for general adult medical examination without abnormal findings: Secondary | ICD-10-CM | POA: Diagnosis not present

## 2022-02-20 LAB — COMPREHENSIVE METABOLIC PANEL
ALT: 16 U/L (ref 0–35)
AST: 23 U/L (ref 0–37)
Albumin: 4.1 g/dL (ref 3.5–5.2)
Alkaline Phosphatase: 83 U/L (ref 39–117)
BUN: 14 mg/dL (ref 6–23)
CO2: 27 mEq/L (ref 19–32)
Calcium: 9.4 mg/dL (ref 8.4–10.5)
Chloride: 105 mEq/L (ref 96–112)
Creatinine, Ser: 0.73 mg/dL (ref 0.40–1.20)
GFR: 82.52 mL/min (ref >60.00)
Glucose, Bld: 94 mg/dL (ref 70–99)
Potassium: 4.1 mEq/L (ref 3.5–5.1)
Sodium: 140 mEq/L (ref 135–145)
Total Bilirubin: 0.4 mg/dL (ref 0.2–1.2)
Total Protein: 7.7 g/dL (ref 6.0–8.3)

## 2022-02-20 LAB — CBC WITH DIFFERENTIAL/PLATELET
Basophils Absolute: 0.1 10*3/uL (ref 0.0–0.1)
Basophils Relative: 1 % (ref 0.0–3.0)
Eosinophils Absolute: 0.1 10*3/uL (ref 0.0–0.7)
Eosinophils Relative: 1.2 % (ref 0.0–5.0)
HCT: 42.5 % (ref 36.0–46.0)
Hemoglobin: 14 g/dL (ref 12.0–15.0)
Lymphocytes Relative: 26.4 % (ref 12.0–46.0)
Lymphs Abs: 2.2 10*3/uL (ref 0.7–4.0)
MCHC: 32.9 g/dL (ref 30.0–36.0)
MCV: 99.7 fl (ref 78.0–100.0)
Monocytes Absolute: 0.7 10*3/uL (ref 0.1–1.0)
Monocytes Relative: 8.1 % (ref 3.0–12.0)
Neutro Abs: 5.2 10*3/uL (ref 1.4–7.7)
Neutrophils Relative %: 63.3 % (ref 43.0–77.0)
Platelets: 411 10*3/uL — ABNORMAL HIGH (ref 150.0–400.0)
RBC: 4.26 Mil/uL (ref 3.87–5.11)
RDW: 12.8 % (ref 11.5–15.5)
WBC: 8.3 10*3/uL (ref 4.0–10.5)

## 2022-02-20 LAB — LIPID PANEL
Cholesterol: 194 mg/dL (ref 0–200)
HDL: 73 mg/dL (ref >39.00)
LDL Cholesterol: 108 mg/dL — ABNORMAL HIGH (ref 0–99)
NonHDL: 121.27
Total CHOL/HDL Ratio: 3
Triglycerides: 64 mg/dL (ref 0.0–149.0)
VLDL: 12.8 mg/dL (ref 0.0–40.0)

## 2022-02-20 LAB — HEMOGLOBIN A1C: Hgb A1c MFr Bld: 5.8 % (ref 4.6–6.5)

## 2022-02-20 LAB — T4, FREE: Free T4: 0.81 ng/dL (ref 0.60–1.60)

## 2022-02-20 LAB — TSH: TSH: 1.14 u[IU]/mL (ref 0.35–5.50)

## 2022-02-20 NOTE — Progress Notes (Signed)
Subjective:     Allison Dunlap is a 71 y.o. female and is here for a comprehensive physical exam. The patient reports she has been doing well.  Walking for exercise with her dog.  P planted a garden which she tends.  Denies any current issues or concerns.  Pt plans to drive to [place] with her sister next week.  Social History   Socioeconomic History   Marital status: Widowed    Spouse name: Not on file   Number of children: 0   Years of education: Not on file   Highest education level: Not on file  Occupational History   Occupation: retired  Tobacco Use   Smoking status: Former    Packs/day: 0.50    Years: 30.00    Total pack years: 15.00    Types: Cigarettes    Quit date: 07/28/1997    Years since quitting: 24.5   Smokeless tobacco: Former  Scientific laboratory technician Use: Never used  Substance and Sexual Activity   Alcohol use: Yes    Alcohol/week: 7.0 standard drinks of alcohol    Types: 7 Cans of beer per week   Drug use: No   Sexual activity: Not on file  Other Topics Concern   Not on file  Social History Narrative   Not on file   Social Determinants of Health   Financial Resource Strain: Low Risk  (01/29/2022)   Overall Financial Resource Strain (CARDIA)    Difficulty of Paying Living Expenses: Not hard at all  Food Insecurity: No Food Insecurity (01/29/2022)   Hunger Vital Sign    Worried About Running Out of Food in the Last Year: Never true    Ran Out of Food in the Last Year: Never true  Transportation Needs: No Transportation Needs (01/29/2022)   PRAPARE - Hydrologist (Medical): No    Lack of Transportation (Non-Medical): No  Physical Activity: Sufficiently Active (01/29/2022)   Exercise Vital Sign    Days of Exercise per Week: 7 days    Minutes of Exercise per Session: 30 min  Stress: No Stress Concern Present (01/29/2022)   [place]    Feeling of Stress :  Not at all  Social Connections: Socially Isolated (01/29/2022)   Social Connection and Isolation Panel [NHANES]    Frequency of Communication with Friends and Family: More than three times a week    Frequency of Social Gatherings with Friends and Family: More than three times a week    Attends Religious Services: Never    Marine scientist or Organizations: Not on file    Attends Archivist Meetings: Never    Marital Status: Widowed  Intimate Partner Violence: Not At Risk (01/29/2022)   Humiliation, Afraid, Rape, and Kick questionnaire    Fear of Current or Ex-Partner: No    Emotionally Abused: No    Physically Abused: No    Sexually Abused: No   Health Maintenance  Topic Date Due   COVID-19 Vaccine (6 - Booster for Moderna series) 03/08/2022 (Originally 08/22/2021)   Zoster Vaccines- Shingrix (1 of 2) 05/01/2022 (Originally 05/02/1970)   TETANUS/TDAP  01/30/2023 (Originally 05/02/1970)   INFLUENZA VACCINE  02/25/2022   MAMMOGRAM  05/11/2022   COLONOSCOPY (Pts 45-69yr Insurance coverage will need to be confirmed)  10/17/2024   Pneumonia Vaccine 72 Years old  Completed   DEXA SCAN  Completed   Hepatitis C Screening  Completed   HPV VACCINES  Aged Out    The following portions of the patient's history were reviewed and updated as appropriate: allergies, current medications, past family history, past medical history, past social history, past surgical history, and problem list.  Review of Systems Pertinent items noted in HPI and remainder of comprehensive ROS otherwise negative.   Objective:    BP 124/64 (BP Location: Left Arm, Patient Position: Sitting, Cuff Size: Normal)   Pulse 64   Temp 98.5 F (36.9 C) (Oral)   Ht 5' 0.5" (1.537 m)   Wt 115 lb 12.8 oz (52.5 kg)   SpO2 98%   BMI 22.24 kg/m  General appearance: alert, cooperative, and no distress Head: Normocephalic, without obvious abnormality, atraumatic Eyes: conjunctivae/corneas clear. PERRL, EOM's  intact. Fundi benign. Ears: normal TM's and external ear canals both ears Nose: Nares normal. Septum midline. Mucosa normal. No drainage or sinus tenderness. Throat: lips, mucosa, and tongue normal; teeth and gums normal Neck: no adenopathy, no carotid bruit, no JVD, supple, symmetrical, trachea midline, and thyroid not enlarged, symmetric, no tenderness/mass/nodules Lungs: clear to auscultation bilaterally Heart: regular rate and rhythm, S1, S2 normal, no murmur, click, rub or gallop Abdomen: soft, non-tender; bowel sounds normal; no masses,  no organomegaly Extremities: extremities normal, atraumatic, no cyanosis or edema Pulses: 2+ and symmetric Skin: Skin color, texture, turgor normal. No rashes or lesions Lymph nodes: Cervical, supraclavicular, and axillary nodes normal. Neurologic: Alert and oriented X 3, normal strength and tone. Normal symmetric reflexes. Normal coordination and gait    Assessment:    Healthy female exam.      Plan:    Anticipatory guidance given including wearing seatbelts, smoke detectors in the home, increasing physical activity, increasing p.o. intake of water and vegetables. -labs -Colonoscopy done 10/17/2021: Diverticulosis in sigmoid and descending colon.  End-to-end colocolonic anastomosis with healthy-appearing mucosa with scattered nonbleeding ectasias (likely 2/2 prior XRT).  Repeat colonoscopy in 5 years, 2028 -Last mammogram 05/11/2020 normal. -Bone density done 02/19/2021 -Immunizations reviewed.  Discussed obtaining Tdap and shingles vaccine.  Patient declines this visit but plans to consider. 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  Pneumonias vaccine up-to-date. - Plan: CBC with Differential/Platelet, CMP  Aortic atherosclerosis (HCC)  -lifestyle modifications -  Plan: Lipid panel  History of rectal cancer  -continue f/u with Oncology - Plan: CBC with Differential/Platelet, CMP  F/u prn   Grier Mitts, MD

## 2022-05-12 DIAGNOSIS — D239 Other benign neoplasm of skin, unspecified: Secondary | ICD-10-CM | POA: Diagnosis not present

## 2022-05-12 DIAGNOSIS — L820 Inflamed seborrheic keratosis: Secondary | ICD-10-CM | POA: Diagnosis not present

## 2023-01-20 DIAGNOSIS — H5212 Myopia, left eye: Secondary | ICD-10-CM | POA: Diagnosis not present

## 2023-01-20 DIAGNOSIS — H52223 Regular astigmatism, bilateral: Secondary | ICD-10-CM | POA: Diagnosis not present

## 2023-01-20 DIAGNOSIS — H524 Presbyopia: Secondary | ICD-10-CM | POA: Diagnosis not present

## 2023-02-02 ENCOUNTER — Ambulatory Visit (INDEPENDENT_AMBULATORY_CARE_PROVIDER_SITE_OTHER): Payer: Medicare HMO

## 2023-02-02 VITALS — Ht 66.0 in | Wt 115.0 lb

## 2023-02-02 DIAGNOSIS — Z0000 Encounter for general adult medical examination without abnormal findings: Secondary | ICD-10-CM

## 2023-02-02 NOTE — Progress Notes (Signed)
Subjective:   Allison Dunlap is a 72 y.o. female who presents for Medicare Annual (Subsequent) preventive examination.  Visit Complete: Virtual  I connected with  Allison Dunlap on 02/02/23 by a audio enabled telemedicine application and verified that I am speaking with the correct person using two identifiers.  Patient Location: Home  Provider Location: Home Office  I discussed the limitations of evaluation and management by telemedicine. The patient expressed understanding and agreed to proceed.  Patient Medicare AWV questionnaire was completed by the patient on 01/26/23; I have confirmed that all information answered by patient is correct and no changes since this date.  Review of Systems     Cardiac Risk Factors include: advanced age (>29men, >53 women)     Objective:    Today's Vitals   02/02/23 0818  Weight: 115 lb (52.2 kg)  Height: 5\' 6"  (1.676 m)   Body mass index is 18.56 kg/m.     02/02/2023    8:24 AM 01/29/2022    8:25 AM 10/17/2021   10:10 AM 01/15/2021    1:16 PM 10/12/2019    1:38 PM 08/07/2017    1:45 PM 06/11/2017    2:04 PM  Advanced Directives  Does Patient Have a Medical Advance Directive? Yes Yes Yes Yes Yes Yes Yes  Type of Estate agent of [place];Living will Healthcare Power of [place];Living will  Healthcare Power of [employer] of [place];Living will  Does patient want to make changes to medical advance directive? No - Patient declined No - Patient declined       Copy of Healthcare Power of Attorney in Chart? Yes - validated most recent copy scanned in chart (See row information) Yes - validated most recent copy scanned in chart (See row information)  Yes - validated most recent copy scanned in chart (See row information)   No - copy requested    Current Medications (verified) Outpatient Encounter Medications as of 02/02/2023  Medication Sig   BLACK PEPPER-TURMERIC PO Take 1 capsule by mouth  daily with breakfast.   Calcium Carb-Cholecalciferol (CALCIUM+D3 PO) Take 1 tablet by mouth in the morning.   cholecalciferol (VITAMIN D) 1000 units tablet Take 1 tablet by mouth daily.   ibuprofen (ADVIL) 200 MG tablet Take 200 mg by mouth daily as needed (pan.).   MAGNESIUM PO Take by mouth.   No facility-administered encounter medications on file as of 02/02/2023.    Allergies (verified) Patient has no known allergies.   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   Temporary low platelet count [facility])    Past Surgical History:  Procedure Laterality Date   COLON SURGERY  2017   COLONOSCOPY  02/23/2018   with propofol Dr.Pyrtle   COLONOSCOPY WITH PROPOFOL N/A 10/17/2021   Procedure: COLONOSCOPY WITH PROPOFOL;  Surgeon: Beverley Fiedler, MD;  Location: [facility];  Service: Gastroenterology;  Laterality: N/A;   INTRAOPERATIVE CHOLANGIOGRAM  02/18/2017   Procedure: INTRAOPERATIVE ASSESSMENT OF VASCULAR PERFUSION;  Surgeon: Romie Levee, MD;  Location: [facility];  Service: General;;   LAPAROSCOPIC SPLENECTOMY  02/18/2017   Procedure: XI ROBOTIC ASSISTED SPLENECTOMY;  Surgeon: Karie Soda, MD;  Location: [facility];  Service: General;;   NO PAST SURGERIES     POLYPECTOMY     XI ROBOTIC ASSISTED LOWER ANTERIOR RESECTION N/A  02/18/2017   Procedure: XI ROBOTIC ASSISTED LOWER ANTERIOR RESECTION WITH SPLENIC FLEXURE MOBILIZATION;  Surgeon: Romie Levee, MD;  Location: [facility];  Service: General;  Laterality: N/A;   Family History  Problem Relation Age of Onset   Alzheimer's disease Mother    Liver disease Father 89       failure   Breast cancer Sister    Cancer Sister 3       breast cancer    Colon polyps Sister    Colon polyps Brother    Heart disease Maternal Grandfather    Colon cancer Paternal Grandmother    Cancer Paternal Grandmother         type unknown   Rectal cancer Paternal Grandmother    Esophageal cancer Neg Hx    Stomach cancer Neg Hx    Social History   Socioeconomic History   Marital status: Widowed    Spouse name: Not on file   Number of children: 0   Years of education: Not on file   Highest education level: Not on file  Occupational History   Occupation: retired  Tobacco Use   Smoking status: Former    Packs/day: 0.50    Years: 30.00    Additional pack years: 0.00    Total pack years: 15.00    Types: Cigarettes    Quit date: 07/28/1997    Years since quitting: 25.5   Smokeless tobacco: Former  Building services engineer Use: Never used  Substance and Sexual Activity   Alcohol use: Yes    Alcohol/week: 7.0 standard drinks of alcohol    Types: 7 Cans of beer per week   Drug use: No   Sexual activity: Not on file  Other Topics Concern   Not on file  Social History Narrative   Not on file   Social Determinants of Health   Financial Resource Strain: Low Risk  (02/02/2023)   Overall Financial Resource Strain (CARDIA)    Difficulty of Paying Living Expenses: Not hard at all  Food Insecurity: No Food Insecurity (02/02/2023)   Hunger Vital Sign    Worried About Running Out of Food in the Last Year: Never true    Ran Out of Food in the Last Year: Never true  Transportation Needs: No Transportation Needs (02/02/2023)   PRAPARE - Administrator, Civil Service (Medical): No    Lack of Transportation (Non-Medical): No  Physical Activity: Insufficiently Active (02/02/2023)   Exercise Vital Sign    Days of Exercise per Week: 3 days    Minutes of Exercise per Session: 30 min  Stress: No Stress Concern Present (02/02/2023)   [employer] of Occupational Health - Occupational Stress Questionnaire    Feeling of Stress : Not at all  Social Connections: Moderately Isolated (02/02/2023)   Social Connection and Isolation Panel [NHANES]    Frequency of Communication with Friends and Family: More than three  times a week    Frequency of Social Gatherings with Friends and Family: More than three times a week    Attends Religious Services: More than 4 times per year    Active Member of [employer] or Organizations: No    Attends Banker Meetings: Never    Marital Status: Widowed    Tobacco Counseling Counseling given: Not Answered   Clinical Intake:  Pre-visit preparation completed: Yes  Pain : No/denies pain     BMI - recorded: 18.56 Nutritional Status: BMI <19  Underweight Nutritional Risks: None Diabetes:  No  How often do you need to have someone help you when you read instructions, pamphlets, or other written materials from your doctor or pharmacy?: 1 - Never  Interpreter Needed?: No  Information entered by :: Theresa Mulligan LPN   Activities of Daily Living    02/02/2023    8:23 AM 01/26/2023    4:50 PM  In your present state of health, do you have any difficulty performing the following activities:  Hearing? 0 0  Vision? 0 0  Difficulty concentrating or making decisions? 0 0  Walking or climbing stairs? 0 0  Dressing or bathing? 0 0  Doing errands, shopping? 0 0  Preparing Food and eating ? N N  Using the Toilet? N N  In the past six months, have you accidently leaked urine? N N  Do you have problems with loss of bowel control? N N  Managing your Medications? N N  Managing your Finances? N N  Housekeeping or managing your Housekeeping? N N    Patient Care Team: Deeann Saint, MD as PCP - General (Family Medicine) Pyrtle, Carie Caddy, MD as Consulting Physician (Gastroenterology) Romie Levee, MD as Consulting Physician (General Surgery) Dorothy Puffer, MD as Consulting Physician (Radiation Oncology) Malachy Mood, MD as Consulting Physician (Hematology) Karie Soda, MD as Consulting Physician (General Surgery)  Indicate any recent Medical Services you may have received from other than [facility] providers in the past year (date may be approximate).      Assessment:   This is a routine wellness examination for [place].  Hearing/Vision screen Hearing Screening - Comments:: Denies hearing difficulties   Vision Screening - Comments:: Wears rx glasses - up to date with routine eye exams with  Dr Lucina Mellow  Dietary issues and exercise activities discussed:     Goals Addressed               This Visit's Progress     Patient Stated (pt-stated)        Maintain healthy state.       Depression Screen    02/02/2023    8:22 AM 02/20/2022    9:39 AM 01/29/2022    8:22 AM 08/12/2021   11:49 AM 01/15/2021    1:13 PM 01/12/2017    1:27 PM  PHQ 2/9 Scores  PHQ - 2 Score 0 0 0 0 0 0  PHQ- 9 Score  0  1      Fall Risk    02/02/2023    8:24 AM 01/26/2023    4:50 PM 02/20/2022    9:40 AM 01/29/2022    8:25 AM 08/12/2021   11:49 AM  Fall Risk   Falls in the past year? 0 0 0 0 0  Number falls in past yr: 0 0 0 0   Injury with Fall? 0 0 0 0   Risk for fall due to : No Fall Risks  No Fall Risks No Fall Risks   Follow up Falls prevention discussed  Falls evaluation completed      MEDICARE RISK AT HOME:  Medicare Risk at Home - 02/02/23 0829     Any stairs in or around the home? Yes    If so, are there any without handrails? No    Home free of loose throw rugs in walkways, pet beds, electrical cords, etc? Yes    Adequate lighting in your home to reduce risk of falls? Yes    Life alert? No    Use of a cane, walker  or w/c? No    Grab bars in the bathroom? Yes    Shower chair or bench in shower? No    Elevated toilet seat or a handicapped toilet? No             TIMED UP AND GO:  Was the test performed?  No    Cognitive Function:        02/02/2023    8:24 AM 01/29/2022    8:25 AM 01/15/2021    1:19 PM  6CIT Screen  What Year? 0 points 0 points 0 points  What month? 0 points 0 points 0 points  What time? 0 points 0 points 0 points  Count back from 20 0 points 0 points 0 points  Months in reverse 0 points 0 points 0 points  Repeat  phrase 0 points 0 points 0 points  Total Score 0 points 0 points 0 points    Immunizations Immunization History  Administered Date(s) Administered   HIB (PRP-T) 01/22/2017   Influenza, High Dose Seasonal PF 04/24/2019   Meningococcal Conjugate 01/26/2017   Moderna Sars-Covid-2 Vaccination 08/26/2019, 09/23/2019, 06/16/2020, 01/03/2021, 06/27/2021   Pneumococcal Conjugate-13 01/21/2017   Pneumococcal Polysaccharide-23 01/17/2021    TDAP status: Due, Education has been provided regarding the importance of this vaccine. Advised may receive this vaccine at local pharmacy or Health Dept. Aware to provide a copy of the vaccination record if obtained from local pharmacy or Health Dept. Verbalized acceptance and understanding.    Pneumococcal vaccine status: Up to date  Covid-19 vaccine status: Completed vaccines  Qualifies for Shingles Vaccine? Yes   Zostavax completed No   Shingrix Completed?: No.    Education has been provided regarding the importance of this vaccine. Patient has been advised to call insurance company to determine out of pocket expense if they have not yet received this vaccine. Advised may also receive vaccine at local pharmacy or Health Dept. Verbalized acceptance and understanding.  Screening Tests Health Maintenance  Topic Date Due   DTaP/Tdap/Td (1 - Tdap) Never done   COVID-19 Vaccine (6 - 2023-24 season) 02/18/2023 (Originally 03/28/2022)   Zoster Vaccines- Shingrix (1 of 2) 05/05/2023 (Originally 05/02/1970)   MAMMOGRAM  02/02/2024 (Originally 05/11/2022)   INFLUENZA VACCINE  02/26/2023   Medicare Annual Wellness (AWV)  02/02/2024   Colonoscopy  10/17/2024   Pneumonia Vaccine 16+ Years old  Completed   DEXA SCAN  Completed   Hepatitis C Screening  Completed   HPV VACCINES  Aged Out    Health Maintenance  Health Maintenance Due  Topic Date Due   DTaP/Tdap/Td (1 - Tdap) Never done    Colorectal cancer screening: Type of screening: Colonoscopy.  Completed 10/17/21. Repeat every 3 years  Mammogram status: Ordered Patient deferred. Pt provided with contact info and advised to call to schedule appt.   Bone Density status: Completed 02/19/21. Results reflect: Bone density results: OSTEOPOROSIS. Repeat every   years.  Lung Cancer Screening: (Low Dose CT Chest recommended if Age 74-80 years, 20 pack-year currently smoking OR have quit w/in 15years.) does not qualify.     Additional Screening:  Hepatitis C Screening: does qualify; Completed 01/17/21  Vision Screening: Recommended annual ophthalmology exams for early detection of glaucoma and other disorders of the eye. Is the patient up to date with their annual eye exam?  Yes  Who is the provider or what is the name of the office in which the patient attends annual eye exams? Dr. Lucina Mellow If pt is not established with  a provider, would they like to be referred to a provider to establish care? No .   Dental Screening: Recommended annual dental exams for proper oral hygiene    Community Resource Referral / Chronic Care Management:  CRR required this visit?  No   CCM required this visit?  No     Plan:     I have personally reviewed and noted the following in the patient's chart:   Medical and social history Use of alcohol, tobacco or illicit drugs  Current medications and supplements including opioid prescriptions. Patient is not currently taking opioid prescriptions. Functional ability and status Nutritional status Physical activity Advanced directives List of other physicians Hospitalizations, surgeries, and ER visits in previous 12 months Vitals Screenings to include cognitive, depression, and falls Referrals and appointments  In addition, I have reviewed and discussed with patient certain preventive protocols, quality metrics, and best practice recommendations. A written personalized care plan for preventive services as well as general preventive health recommendations  were provided to patient.     Tillie Rung, LPN   08/02/1094   After Visit Summary: (MyChart) Due to this being a telephonic visit, the after visit summary with patients personalized plan was offered to patient via MyChart   Nurse Notes: None

## 2023-02-02 NOTE — Patient Instructions (Addendum)
Allison Dunlap , Thank you for taking time to come for your Medicare Wellness Visit. I appreciate your ongoing commitment to your health goals. Please review the following plan we discussed and let me know if I can assist you in the future.   These are the goals we discussed:  Goals       Patient Stated (pt-stated)      Maintain healthy state.        This is a list of the screening recommended for you and due dates:  Health Maintenance  Topic Date Due   DTaP/Tdap/Td vaccine (1 - Tdap) Never done   COVID-19 Vaccine (6 - 2023-24 season) 02/18/2023*   Zoster (Shingles) Vaccine (1 of 2) 05/05/2023*   Mammogram  02/02/2024*   Flu Shot  02/26/2023   Medicare Annual Wellness Visit  02/02/2024   Colon Cancer Screening  10/17/2024   Pneumonia Vaccine  Completed   DEXA scan (bone density measurement)  Completed   Hepatitis C Screening  Completed   HPV Vaccine  Aged Out  *Topic was postponed. The date shown is not the original due date.    Advanced directives: In Chart  Conditions/risks identified: None  Next appointment: Follow up in one year for your annual wellness visit    Preventive Care 65 Years and Older, Female Preventive care refers to lifestyle choices and visits with your health care provider that can promote health and wellness. What does preventive care include? A yearly physical exam. This is also called an annual well check. Dental exams once or twice a year. Routine eye exams. Ask your health care provider how often you should have your eyes checked. Personal lifestyle choices, including: Daily care of your teeth and gums. Regular physical activity. Eating a healthy diet. Avoiding tobacco and drug use. Limiting alcohol use. Practicing safe sex. Taking low-dose aspirin every day. Taking vitamin and mineral supplements as recommended by your health care provider. What happens during an annual well check? The services and screenings done by your health care  provider during your annual well check will depend on your age, overall health, lifestyle risk factors, and family history of disease. Counseling  Your health care provider may ask you questions about your: Alcohol use. Tobacco use. Drug use. Emotional well-being. Home and relationship well-being. Sexual activity. Eating habits. History of falls. Memory and ability to understand (cognition). Work and work Astronomer. Reproductive health. Screening  You may have the following tests or measurements: Height, weight, and BMI. Blood pressure. Lipid and cholesterol levels. These may be checked every 5 years, or more frequently if you are over 82 years old. Skin check. Lung cancer screening. You may have this screening every year starting at age 68 if you have a 30-pack-year history of smoking and currently smoke or have quit within the past 15 years. Fecal occult blood test (FOBT) of the stool. You may have this test every year starting at age 69. Flexible sigmoidoscopy or colonoscopy. You may have a sigmoidoscopy every 5 years or a colonoscopy every 10 years starting at age 13. Hepatitis C blood test. Hepatitis B blood test. Sexually transmitted disease (STD) testing. Diabetes screening. This is done by checking your blood sugar (glucose) after you have not eaten for a while (fasting). You may have this done every 1-3 years. Bone density scan. This is done to screen for osteoporosis. You may have this done starting at age 67. Mammogram. This may be done every 1-2 years. Talk to your health care provider about  how often you should have regular mammograms. Talk with your health care provider about your test results, treatment options, and if necessary, the need for more tests. Vaccines  Your health care provider may recommend certain vaccines, such as: Influenza vaccine. This is recommended every year. Tetanus, diphtheria, and acellular pertussis (Tdap, Td) vaccine. You may need a Td  booster every 10 years. Zoster vaccine. You may need this after age 13. Pneumococcal 13-valent conjugate (PCV13) vaccine. One dose is recommended after age 80. Pneumococcal polysaccharide (PPSV23) vaccine. One dose is recommended after age 58. Talk to your health care provider about which screenings and vaccines you need and how often you need them. This information is not intended to replace advice given to you by your health care provider. Make sure you discuss any questions you have with your health care provider. Document Released: 08/10/2015 Document Revised: 04/02/2016 Document Reviewed: 05/15/2015 Elsevier Interactive Patient Education  2017 [employer].  Fall Prevention in the Home Falls can cause injuries. They can happen to people of all ages. There are many things you can do to make your home safe and to help prevent falls. What can I do on the outside of my home? Regularly fix the edges of walkways and driveways and fix any cracks. Remove anything that might make you trip as you walk through a door, such as a raised step or threshold. Trim any bushes or trees on the path to your home. Use bright outdoor lighting. Clear any walking paths of anything that might make someone trip, such as rocks or tools. Regularly check to see if handrails are loose or broken. Make sure that both sides of any steps have handrails. Any raised decks and porches should have guardrails on the edges. Have any leaves, snow, or ice cleared regularly. Use sand or salt on walking paths during winter. Clean up any spills in your garage right away. This includes oil or grease spills. What can I do in the bathroom? Use night lights. Install grab bars by the toilet and in the tub and shower. Do not use towel bars as grab bars. Use non-skid mats or decals in the tub or shower. If you need to sit down in the shower, use a plastic, non-slip stool. Keep the floor dry. Clean up any water that spills on the floor  as soon as it happens. Remove soap buildup in the tub or shower regularly. Attach bath mats securely with double-sided non-slip rug tape. Do not have throw rugs and other things on the floor that can make you trip. What can I do in the bedroom? Use night lights. Make sure that you have a light by your bed that is easy to reach. Do not use any sheets or blankets that are too big for your bed. They should not hang down onto the floor. Have a firm chair that has side arms. You can use this for support while you get dressed. Do not have throw rugs and other things on the floor that can make you trip. What can I do in the kitchen? Clean up any spills right away. Avoid walking on wet floors. Keep items that you use a lot in easy-to-reach places. If you need to reach something above you, use a strong step stool that has a grab bar. Keep electrical cords out of the way. Do not use floor polish or wax that makes floors slippery. If you must use wax, use non-skid floor wax. Do not have throw rugs and other  things on the floor that can make you trip. What can I do with my stairs? Do not leave any items on the stairs. Make sure that there are handrails on both sides of the stairs and use them. Fix handrails that are broken or loose. Make sure that handrails are as long as the stairways. Check any carpeting to make sure that it is firmly attached to the stairs. Fix any carpet that is loose or worn. Avoid having throw rugs at the top or bottom of the stairs. If you do have throw rugs, attach them to the floor with carpet tape. Make sure that you have a light switch at the top of the stairs and the bottom of the stairs. If you do not have them, ask someone to add them for you. What else can I do to help prevent falls? Wear shoes that: Do not have high heels. Have rubber bottoms. Are comfortable and fit you well. Are closed at the toe. Do not wear sandals. If you use a stepladder: Make sure that it is  fully opened. Do not climb a closed stepladder. Make sure that both sides of the stepladder are locked into place. Ask someone to hold it for you, if possible. Clearly mark and make sure that you can see: Any grab bars or handrails. First and last steps. Where the edge of each step is. Use tools that help you move around (mobility aids) if they are needed. These include: Canes. Walkers. Scooters. Crutches. Turn on the lights when you go into a dark area. Replace any light bulbs as soon as they burn out. Set up your furniture so you have a clear path. Avoid moving your furniture around. If any of your floors are uneven, fix them. If there are any pets around you, be aware of where they are. Review your medicines with your doctor. Some medicines can make you feel dizzy. This can increase your chance of falling. Ask your doctor what other things that you can do to help prevent falls. This information is not intended to replace advice given to you by your health care provider. Make sure you discuss any questions you have with your health care provider. Document Released: 05/10/2009 Document Revised: 12/20/2015 Document Reviewed: 08/18/2014 Elsevier Interactive Patient Education  2017 [employer].

## 2023-02-03 ENCOUNTER — Other Ambulatory Visit: Payer: Self-pay | Admitting: Family Medicine

## 2023-02-03 DIAGNOSIS — Z1231 Encounter for screening mammogram for malignant neoplasm of breast: Secondary | ICD-10-CM

## 2023-02-11 ENCOUNTER — Ambulatory Visit
Admission: RE | Admit: 2023-02-11 | Discharge: 2023-02-11 | Disposition: A | Discharge status: Home / Self Care | Payer: Medicare HMO | Source: Ambulatory Visit | Attending: Family Medicine | Admitting: Family Medicine

## 2023-02-11 DIAGNOSIS — Z1231 Encounter for screening mammogram for malignant neoplasm of breast: Secondary | ICD-10-CM | POA: Diagnosis not present

## 2023-04-30 ENCOUNTER — Encounter: Payer: Self-pay | Admitting: Family Medicine

## 2023-04-30 ENCOUNTER — Ambulatory Visit: Payer: Medicare HMO | Admitting: Family Medicine

## 2023-04-30 VITALS — BP 122/70 | HR 63 | Temp 98.4°F | Ht 66.0 in | Wt 112.4 lb

## 2023-04-30 DIAGNOSIS — W57XXXA Bitten or stung by nonvenomous insect and other nonvenomous arthropods, initial encounter: Secondary | ICD-10-CM

## 2023-04-30 DIAGNOSIS — S20161A Insect bite (nonvenomous) of breast, right breast, initial encounter: Secondary | ICD-10-CM

## 2023-04-30 DIAGNOSIS — L720 Epidermal cyst: Secondary | ICD-10-CM | POA: Diagnosis not present

## 2023-04-30 MED ORDER — DOXYCYCLINE HYCLATE 100 MG PO TABS
200.0000 mg | ORAL_TABLET | Freq: Once | ORAL | 0 refills | Status: AC
Start: 2023-04-30 — End: 2023-04-30

## 2023-04-30 NOTE — Progress Notes (Signed)
Established Patient Office Visit   Subjective  Patient ID: Allison Dunlap, female    DOB: November 22, 1950  Age: 72 y.o. MRN: [id number]  Chief Complaint  Patient presents with   Insect Bite    Tick bite on the right breast, 04/29/23, there is some redness around the area,     Pt is a 72 yo female seen for acute concern.  Pt recently returned from [facility] area.  Last night she noticed a tick attached to skin underneath right breast.  Tick did not seem engorged and was removed.  Patient states last night the area was tender to touch which caused her to notice it.  Feels slightly better today.  Patient denies fever, chills, joint pain.    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 02/18/2017 02/18/2017   Chronic ITP (idiopathic thrombocytopenia) (HCC) 12/15/2016   Rectal cancer (HCC) 10/21/2016   Past Medical History:  Diagnosis Date   Arthritis    Blood in stool 2018   Clotting disorder (HCC)    spleen removed   rectal ca 10/14/2016   rectal cancer-adenocarcinoma   Temporary low platelet count (HCC)    Past Surgical History:  Procedure Laterality Date   COLON SURGERY  2017   COLONOSCOPY  02/23/2018   with propofol Dr.Pyrtle   COLONOSCOPY WITH PROPOFOL N/A 10/17/2021   Procedure: COLONOSCOPY WITH PROPOFOL;  Surgeon: Beverley Fiedler, MD;  Location: [facility];  Service: Gastroenterology;  Laterality: N/A;   INTRAOPERATIVE CHOLANGIOGRAM  02/18/2017   Procedure: INTRAOPERATIVE ASSESSMENT OF VASCULAR PERFUSION;  Surgeon: Romie Levee, MD;  Location: [facility];  Service: General;;   LAPAROSCOPIC SPLENECTOMY  02/18/2017   Procedure: XI ROBOTIC ASSISTED SPLENECTOMY;  Surgeon: Karie Soda, MD;  Location: [facility];  Service: General;;   NO PAST SURGERIES     POLYPECTOMY     XI ROBOTIC ASSISTED  LOWER ANTERIOR RESECTION N/A 02/18/2017   Procedure: XI ROBOTIC ASSISTED LOWER ANTERIOR RESECTION WITH SPLENIC FLEXURE MOBILIZATION;  Surgeon: Romie Levee, MD;  Location: [facility];  Service: General;  Laterality: N/A;   Social History   Tobacco Use   Smoking status: Former    Current packs/day: 0.00    Average packs/day: 0.5 packs/day for 30.0 years (15.0 ttl pk-yrs)    Types: Cigarettes    Start date: 07/29/1967    Quit date: 07/28/1997    Years since quitting: 25.7   Smokeless tobacco: Former  Advertising account planner   Vaping status: Never Used  Substance Use Topics   Alcohol use: Yes    Alcohol/week: 7.0 standard drinks of alcohol    Types: 7 Cans of beer per week   Drug use: No   Family History  Problem Relation Age of Onset   Alzheimer's disease Mother    Liver disease Father 36       failure   Breast cancer Sister    Cancer Sister 51       breast cancer    Colon polyps Sister    Colon polyps Brother    Heart disease Maternal Grandfather    Colon cancer Paternal Grandmother    Cancer Paternal Grandmother        type unknown   Rectal cancer Paternal Grandmother    Esophageal cancer Neg Hx    Stomach cancer Neg Hx  No Known Allergies    ROS Negative unless stated above    Objective:     BP 122/70 (BP Location: Left Arm, Patient Position: Sitting, Cuff Size: Normal)   Pulse 63   Temp 98.4 F (36.9 C) (Oral)   Ht 5\' 6"  (1.676 m)   Wt 112 lb 6.4 oz (51 kg)   SpO2 97%   BMI 18.14 kg/m  BP Readings from Last 3 Encounters:  04/30/23 122/70  02/20/22 124/64  10/17/21 111/61   Wt Readings from Last 3 Encounters:  04/30/23 112 lb 6.4 oz (51 kg)  02/02/23 115 lb (52.2 kg)  02/20/22 115 lb 12.8 oz (52.5 kg)      Physical Exam Constitutional:      General: She is not in acute distress.    Appearance: Normal appearance.  HENT:     Head: Normocephalic and atraumatic.     Nose: Nose normal.     Mouth/Throat:     Mouth: Mucous membranes are moist.  Eyes:      Extraocular Movements: Extraocular movements intact.     Conjunctiva/sclera: Conjunctivae normal.  Cardiovascular:     Rate and Rhythm: Normal rate and regular rhythm.  Pulmonary:     Effort: Pulmonary effort is normal.  Abdominal:     Palpations: Abdomen is soft.  Skin:    General: Skin is warm and dry.     Comments: Midline lower R breast with a 7 mm area of eschar with surrounding ring of edema.  No drainage, induration, increased warmth noted.  L lower abd  midline with a firm, nodule with central opening and dark appearing matter within.  No TTP.  Neurological:     Mental Status: She is alert and oriented to person, place, and time. Mental status is at baseline.      No results found for any visits on 04/30/23.    Assessment & Plan:  Tick bite of right breast, initial encounter -ppx abx -continue to monitor for s/s of tick borne illness.  If needed extend abx course. -     Doxycycline Hyclate; Take 2 tablets (200 mg total) by mouth once for 1 dose.  Dispense: 2 tablet; Refill: 0  Epidermoid cyst of skin -stable -discussed removal options.  Pt wishes to wait at this time.   Return if symptoms worsen or fail to improve.   Deeann Saint, MD

## 2024-02-05 ENCOUNTER — Ambulatory Visit (INDEPENDENT_AMBULATORY_CARE_PROVIDER_SITE_OTHER): Payer: Medicare HMO

## 2024-02-05 VITALS — Ht 60.0 in | Wt 112.0 lb

## 2024-02-05 DIAGNOSIS — Z0000 Encounter for general adult medical examination without abnormal findings: Secondary | ICD-10-CM | POA: Diagnosis not present

## 2024-02-05 NOTE — Patient Instructions (Addendum)
 Ms. Allison Dunlap , Thank you for taking time out of your busy schedule to complete your Annual Wellness Visit with me. I enjoyed our conversation and look forward to speaking with you again next year. I, as well as your care team,  appreciate your ongoing commitment to your health goals. Please review the following plan we discussed and let me know if I can assist you in the future. Your Game plan/ To Do List    Referrals: If you haven't heard from the office you've been referred to, please reach out to them at the phone provided.   Follow up Visits: Next Medicare AWV with our clinical staff: 02/10/25 @ 8a   Have you seen your provider in the last 6 months (3 months if uncontrolled diabetes)? 04/30/23 Next Office Visit with your provider:   Clinician Recommendations:  Aim for 30 minutes of exercise or brisk walking, 6-8 glasses of water, and 5 servings of fruits and vegetables each day.        This is a list of the screening recommended for you and due dates:  Health Maintenance  Topic Date Due   DTaP/Tdap/Td vaccine (1 - Tdap) Never done   Zoster (Shingles) Vaccine (1 of 2) Never done   COVID-19 Vaccine (6 - 2024-25 season) 03/29/2023   Flu Shot  02/26/2024   Colon Cancer Screening  10/17/2024   Medicare Annual Wellness Visit  02/04/2025   Mammogram  02/10/2025   Pneumococcal Vaccine for age over 59  Completed   DEXA scan (bone density measurement)  Completed   Hepatitis C Screening  Completed   Hepatitis B Vaccine  Aged Out   HPV Vaccine  Aged Out   Meningitis B Vaccine  Aged Out    Advanced directives: (In Chart) A copy of your advanced directives are scanned into your chart should your provider ever need it. Advance Care Planning is important because it:  [x]  Makes sure you receive the medical care that is consistent with your values, goals, and preferences  [x]  It provides guidance to your family and loved ones and reduces their decisional burden about whether or not they are  making the right decisions based on your wishes.  Follow the link provided in your after visit summary or read over the paperwork we have mailed to you to help you started getting your Advance Directives in place. If you need assistance in completing these, please reach out to us  so that we can help you!  See attachments for Preventive Care and Fall Prevention Tips.

## 2024-02-05 NOTE — Progress Notes (Signed)
 Subjective:   Allison Dunlap is a 73 y.o. who presents for a Medicare Wellness preventive visit.  As a reminder, Annual Wellness Visits don't include a physical exam, and some assessments may be limited, especially if this visit is performed virtually. We may recommend an in-person follow-up visit with your provider if needed.  Visit Complete: Virtual I connected with  Allison Dunlap on 02/05/24 by a audio enabled telemedicine application and verified that I am speaking with the correct person using two identifiers.  Patient Location: Home  Provider Location: Home Office  I discussed the limitations of evaluation and management by telemedicine. The patient expressed understanding and agreed to proceed.  Vital Signs: Because this visit was a virtual/telehealth visit, some criteria may be missing or patient reported. Any vitals not documented were not able to be obtained and vitals that have been documented are patient reported.    Persons Participating in Visit: Patient.  AWV Questionnaire: Yes: Patient Medicare AWV questionnaire was completed by the patient on 02/05/24; I have confirmed that all information answered by patient is correct and no changes since this date.  Cardiac Risk Factors include: advanced age (>51men, >58 women)     Objective:    Today's Vitals   02/05/24 0804  Weight: 112 lb (50.8 kg)  Height: 5' (1.524 m)   Body mass index is 21.87 kg/m.     02/05/2024    8:09 AM 02/02/2023    8:24 AM 01/29/2022    8:25 AM 10/17/2021   10:10 AM 01/15/2021    1:16 PM 10/12/2019    1:38 PM 08/07/2017    1:45 PM  Advanced Directives  Does Patient Have a Medical Advance Directive? Yes Yes Yes Yes Yes Yes Yes   Type of Estate agent of [place];Living will Healthcare Power of [place];Living will Healthcare Power of [place];Living will  Healthcare Power of Attorney    Does patient want to make changes to medical advance directive? No -  Patient declined No - Patient declined No - Patient declined      Copy of Healthcare Power of Attorney in Chart? Yes - validated most recent copy scanned in chart (See row information) Yes - validated most recent copy scanned in chart (See row information) Yes - validated most recent copy scanned in chart (See row information)  Yes - validated most recent copy scanned in chart (See row information)       Data saved with a previous flowsheet row definition    Current Medications (verified) Outpatient Encounter Medications as of 02/05/2024  Medication Sig   BLACK PEPPER-TURMERIC PO Take 1 capsule by mouth daily with breakfast.   Calcium Carb-Cholecalciferol (CALCIUM+D3 PO) Take 1 tablet by mouth in the morning.   cholecalciferol (VITAMIN D ) 1000 units tablet Take 1 tablet by mouth daily.   ibuprofen  (ADVIL ) 200 MG tablet Take 200 mg by mouth daily as needed (pan.).   MAGNESIUM PO Take by mouth.   No facility-administered encounter medications on file as of 02/05/2024.    Allergies (verified) Patient has no known allergies.   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   Temporary low platelet count [facility])    Past Surgical History:  Procedure Laterality Date   COLON SURGERY  2017   COLONOSCOPY  02/23/2018   with propofol  Dr.Pyrtle  COLONOSCOPY WITH PROPOFOL  N/A 10/17/2021   Procedure: COLONOSCOPY WITH PROPOFOL ;  Surgeon: Albertus Gordy HERO, MD;  Location: [facility];  Service: Gastroenterology;  Laterality: N/A;   INTRAOPERATIVE CHOLANGIOGRAM  02/18/2017   Procedure: INTRAOPERATIVE ASSESSMENT OF VASCULAR PERFUSION;  Surgeon: Debby Hila, MD;  Location: [facility];  Service: General;;   LAPAROSCOPIC SPLENECTOMY  02/18/2017   Procedure: XI ROBOTIC ASSISTED SPLENECTOMY;  Surgeon: Sheldon Standing, MD;  Location: [facility];  Service: General;;   NO PAST  SURGERIES     POLYPECTOMY     XI ROBOTIC ASSISTED LOWER ANTERIOR RESECTION N/A 02/18/2017   Procedure: XI ROBOTIC ASSISTED LOWER ANTERIOR RESECTION WITH SPLENIC FLEXURE MOBILIZATION;  Surgeon: Debby Hila, MD;  Location: [facility];  Service: General;  Laterality: N/A;   Family History  Problem Relation Age of Onset   Alzheimer's disease Mother    Liver disease Father 74       failure   Breast cancer Sister    Cancer Sister 71       breast cancer    Colon polyps Sister    Colon polyps Brother    Heart disease Maternal Grandfather    Colon cancer Paternal Grandmother    Cancer Paternal Grandmother        type unknown   Rectal cancer Paternal Grandmother    Esophageal cancer Neg Hx    Stomach cancer Neg Hx    Social History   Socioeconomic History   Marital status: Widowed    Spouse name: Not on file   Number of children: 0   Years of education: Not on file   Highest education level: Master's degree (e.g., MA, MS, MEng, MEd, MSW, MBA)  Occupational History   Occupation: retired  Tobacco Use   Smoking status: Former    Current packs/day: 0.00    Average packs/day: 0.5 packs/day for 30.0 years (15.0 ttl pk-yrs)    Types: Cigarettes    Start date: 07/29/1967    Quit date: 07/28/1997    Years since quitting: 26.5   Smokeless tobacco: Former  Building services engineer status: Never Used  Substance and Sexual Activity   Alcohol use: Yes    Alcohol/week: 7.0 standard drinks of alcohol    Types: 7 Cans of beer per week   Drug use: No   Sexual activity: Not on file  Other Topics Concern   Not on file  Social History Narrative   Not on file   Social Drivers of Health   Financial Resource Strain: Low Risk  (02/05/2024)   Overall Financial Resource Strain (CARDIA)    Difficulty of Paying Living Expenses: Not hard at all  Food Insecurity: No Food Insecurity (02/05/2024)   Hunger Vital Sign    Worried About Running Out of Food in the Last Year: Never true    Ran Out of Food in the  Last Year: Never true  Transportation Needs: No Transportation Needs (02/05/2024)   PRAPARE - Administrator, Civil Service (Medical): No    Lack of Transportation (Non-Medical): No  Physical Activity: Sufficiently Active (02/05/2024)   Exercise Vital Sign    Days of Exercise per Week: 6 days    Minutes of Exercise per Session: 30 min  Stress: No Stress Concern Present (02/05/2024)   [employer] of Occupational Health - Occupational Stress Questionnaire    Feeling of Stress: Not at all  Social Connections: Socially Isolated (02/05/2024)   Social Connection and Isolation Panel    Frequency of Communication with  Friends and Family: Three times a week    Frequency of Social Gatherings with Friends and Family: Three times a week    Attends Religious Services: Patient declined    Active Member of Clubs or Organizations: No    Attends Banker Meetings: Not on file    Marital Status: Widowed    Tobacco Counseling Counseling given: Not Answered    Clinical Intake:  Pre-visit preparation completed: Yes  Pain : No/denies pain     BMI - recorded: 21.87 Nutritional Status: BMI of 19-24  Normal Nutritional Risks: None Diabetes: No  Lab Results  Component Value Date   HGBA1C 5.8 02/20/2022   HGBA1C 5.7 01/17/2021     How often do you need to have someone help you when you read instructions, pamphlets, or other written materials from your doctor or pharmacy?: 1 - Never  Interpreter Needed?: No  Information entered by :: Allison Blush LPN   Activities of Daily Living     02/05/2024    8:08 AM 02/05/2024    7:22 AM  In your present state of health, do you have any difficulty performing the following activities:  Hearing? 0 0  Vision? 0 0  Difficulty concentrating or making decisions? 0 0  Walking or climbing stairs? 0 0  Dressing or bathing? 0 0  Doing errands, shopping? 0 0  Preparing Food and eating ? N N  Using the Toilet? N N  In the past  six months, have you accidently leaked urine? N N  Do you have problems with loss of bowel control? N N  Managing your Medications? N N  Managing your Finances? N N  Housekeeping or managing your Housekeeping? N N    Patient Care Team: Mercer Clotilda SAUNDERS, MD as PCP - General (Family Medicine) Pyrtle, Gordy HERO, MD as Consulting Physician (Gastroenterology) Debby Hila, MD as Consulting Physician (General Surgery) Dewey Rush, MD as Consulting Physician (Radiation Oncology) Lanny Callander, MD as Consulting Physician (Hematology) Sheldon Standing, MD as Consulting Physician (General Surgery)  I have updated your Care Teams any recent Medical Services you may have received from other providers in the past year.     Assessment:   This is a routine wellness examination for Allison Dunlap.  Hearing/Vision screen Hearing Screening - Comments:: Denies hearing difficulties   Vision Screening - Comments:: Wears rx glasses - up to date with routine eye exams with  Dr Okey   Goals Addressed               This Visit's Progress     Increase physical activity (pt-stated)        Remain active       Depression Screen     02/05/2024    8:10 AM 04/30/2023   11:05 AM 02/02/2023    8:22 AM 02/20/2022    9:39 AM 01/29/2022    8:22 AM 08/12/2021   11:49 AM 01/15/2021    1:13 PM  PHQ 2/9 Scores  PHQ - 2 Score 0 0 0 0 0 0 0  PHQ- 9 Score  0  0  1     Fall Risk     02/05/2024    8:08 AM 02/05/2024    7:22 AM 04/30/2023   11:05 AM 02/02/2023    8:24 AM 01/26/2023    4:50 PM  Fall Risk   Falls in the past year? 0 0 0 0 0  Number falls in past yr: 0  0 0 0  Injury  with Fall? 0  0 0 0  Risk for fall due to : No Fall Risks  No Fall Risks No Fall Risks   Follow up Falls evaluation completed  Falls evaluation completed Falls prevention discussed     MEDICARE RISK AT HOME:  Medicare Risk at Home Any stairs in or around the home?: Yes If so, are there any without handrails?: No Home free of loose throw rugs in  walkways, pet beds, electrical cords, etc?: Yes Adequate lighting in your home to reduce risk of falls?: Yes Life alert?: No Use of a cane, walker or w/c?: No Grab bars in the bathroom?: Yes Shower chair or bench in shower?: No Elevated toilet seat or a handicapped toilet?: No  TIMED UP AND GO:  Was the test performed?  No  Cognitive Function: 6CIT completed        02/05/2024    8:09 AM 02/02/2023    8:24 AM 01/29/2022    8:25 AM 01/15/2021    1:19 PM  6CIT Screen  What Year? 0 points 0 points 0 points 0 points  What month? 0 points 0 points 0 points 0 points  What time? 0 points 0 points 0 points 0 points  Count back from 20 0 points 0 points 0 points 0 points  Months in reverse 0 points 0 points 0 points 0 points  Repeat phrase 0 points 0 points 0 points 0 points  Total Score 0 points 0 points 0 points 0 points    Immunizations Immunization History  Administered Date(s) Administered   HIB (PRP-T) 01/22/2017   Influenza, High Dose Seasonal PF 04/24/2019   Meningococcal Conjugate 01/26/2017   Moderna Sars-Covid-2 Vaccination 08/26/2019, 09/23/2019, 06/16/2020, 01/03/2021, 06/27/2021   Pneumococcal Conjugate-13 01/21/2017   Pneumococcal Polysaccharide-23 01/17/2021    Screening Tests Health Maintenance  Topic Date Due   DTaP/Tdap/Td (1 - Tdap) Never done   Zoster Vaccines- Shingrix (1 of 2) Never done   COVID-19 Vaccine (6 - 2024-25 season) 03/29/2023   INFLUENZA VACCINE  02/26/2024   Colonoscopy  10/17/2024   Medicare Annual Wellness (AWV)  02/04/2025   MAMMOGRAM  02/10/2025   Pneumococcal Vaccine: 50+ Years  Completed   DEXA SCAN  Completed   Hepatitis C Screening  Completed   Hepatitis B Vaccines  Aged Out   HPV VACCINES  Aged Out   Meningococcal B Vaccine  Aged Out    Health Maintenance  Health Maintenance Due  Topic Date Due   DTaP/Tdap/Td (1 - Tdap) Never done   Zoster Vaccines- Shingrix (1 of 2) Never done   COVID-19 Vaccine (6 - 2024-25 season)  03/29/2023   Health Maintenance Items Addressed:   Additional Screening:  Vision Screening: Recommended annual ophthalmology exams for early detection of glaucoma and other disorders of the eye. Would you like a referral to an eye doctor? No    Dental Screening: Recommended annual dental exams for proper oral hygiene  Community Resource Referral / Chronic Care Management: CRR required this visit?  No   CCM required this visit?  No   Plan:    I have personally reviewed and noted the following in the patient's chart:   Medical and social history Use of alcohol, tobacco or illicit drugs  Current medications and supplements including opioid prescriptions. Patient is not currently taking opioid prescriptions. Functional ability and status Nutritional status Physical activity Advanced directives List of other physicians Hospitalizations, surgeries, and ER visits in previous 12 months Vitals Screenings to include cognitive, depression, and  falls Referrals and appointments  In addition, I have reviewed and discussed with patient certain preventive protocols, quality metrics, and best practice recommendations. A written personalized care plan for preventive services as well as general preventive health recommendations were provided to patient.   Allison LELON Blush, LPN   2/88/7974   After Visit Summary: (MyChart) Due to this being a telephonic visit, the after visit summary with patients personalized plan was offered to patient via MyChart   Notes: Nothing significant to report at this time.

## 2025-02-10 ENCOUNTER — Ambulatory Visit
